# Patient Record
Sex: Female | Born: 1980
Health system: Southern US, Community
[De-identification: ages and names within clinical notes are randomized; demographics above are authoritative.]

## PROBLEM LIST (undated history)

## (undated) ENCOUNTER — Inpatient Hospital Stay (HOSPITAL_COMMUNITY): Payer: Self-pay

## (undated) DIAGNOSIS — E559 Vitamin D deficiency, unspecified: Secondary | ICD-10-CM

## (undated) DIAGNOSIS — M549 Dorsalgia, unspecified: Secondary | ICD-10-CM

## (undated) DIAGNOSIS — R7303 Prediabetes: Secondary | ICD-10-CM

## (undated) DIAGNOSIS — F329 Major depressive disorder, single episode, unspecified: Secondary | ICD-10-CM

## (undated) DIAGNOSIS — F419 Anxiety disorder, unspecified: Secondary | ICD-10-CM

## (undated) DIAGNOSIS — R002 Palpitations: Secondary | ICD-10-CM

## (undated) DIAGNOSIS — K219 Gastro-esophageal reflux disease without esophagitis: Secondary | ICD-10-CM

## (undated) DIAGNOSIS — E039 Hypothyroidism, unspecified: Secondary | ICD-10-CM

## (undated) DIAGNOSIS — E119 Type 2 diabetes mellitus without complications: Secondary | ICD-10-CM

## (undated) DIAGNOSIS — F32A Depression, unspecified: Secondary | ICD-10-CM

## (undated) HISTORY — DX: Palpitations: R00.2

## (undated) HISTORY — PX: MOUTH SURGERY: SHX715

## (undated) HISTORY — DX: Gastro-esophageal reflux disease without esophagitis: K21.9

## (undated) HISTORY — DX: Hypothyroidism, unspecified: E03.9

## (undated) HISTORY — DX: Type 2 diabetes mellitus without complications: E11.9

## (undated) HISTORY — DX: Dorsalgia, unspecified: M54.9

## (undated) HISTORY — DX: Prediabetes: R73.03

## (undated) HISTORY — DX: Depression, unspecified: F32.A

## (undated) HISTORY — DX: Vitamin D deficiency, unspecified: E55.9

## (undated) HISTORY — DX: Anxiety disorder, unspecified: F41.9

---
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
## 2001-02-10 ENCOUNTER — Other Ambulatory Visit: Admission: RE | Admit: 2001-02-10 | Discharge: 2001-02-10 | Payer: Self-pay | Admitting: Specialist

## 2002-10-04 ENCOUNTER — Other Ambulatory Visit: Admission: RE | Admit: 2002-10-04 | Discharge: 2002-10-04 | Payer: Self-pay | Admitting: Dermatology

## 2006-08-30 ENCOUNTER — Ambulatory Visit (HOSPITAL_COMMUNITY): Admission: RE | Admit: 2006-08-30 | Discharge: 2006-08-30 | Payer: Self-pay | Admitting: Obstetrics and Gynecology

## 2006-08-31 ENCOUNTER — Ambulatory Visit: Payer: Self-pay | Admitting: Cardiovascular Disease

## 2007-05-03 ENCOUNTER — Inpatient Hospital Stay (HOSPITAL_COMMUNITY): Admission: AD | Admit: 2007-05-03 | Discharge: 2007-05-06 | Payer: Self-pay | Admitting: Obstetrics and Gynecology

## 2007-05-03 ENCOUNTER — Ambulatory Visit: Payer: Self-pay | Admitting: Obstetrics & Gynecology

## 2007-12-18 ENCOUNTER — Other Ambulatory Visit: Admission: RE | Admit: 2007-12-18 | Discharge: 2007-12-18 | Payer: Self-pay | Admitting: Obstetrics and Gynecology

## 2009-01-14 ENCOUNTER — Other Ambulatory Visit: Admission: RE | Admit: 2009-01-14 | Discharge: 2009-01-14 | Payer: Self-pay | Admitting: Obstetrics and Gynecology

## 2010-04-23 ENCOUNTER — Other Ambulatory Visit: Admission: RE | Admit: 2010-04-23 | Discharge: 2010-04-23 | Payer: Self-pay | Admitting: Obstetrics and Gynecology

## 2010-05-28 ENCOUNTER — Ambulatory Visit (HOSPITAL_COMMUNITY): Admission: RE | Admit: 2010-05-28 | Discharge: 2010-05-28 | Payer: Self-pay | Admitting: Obstetrics & Gynecology

## 2010-06-18 ENCOUNTER — Ambulatory Visit (HOSPITAL_COMMUNITY): Admission: RE | Admit: 2010-06-18 | Discharge: 2010-06-18 | Payer: Self-pay | Admitting: Obstetrics & Gynecology

## 2010-07-10 ENCOUNTER — Ambulatory Visit (HOSPITAL_COMMUNITY): Admission: RE | Admit: 2010-07-10 | Discharge: 2010-07-10 | Payer: Self-pay | Admitting: Obstetrics & Gynecology

## 2010-12-09 ENCOUNTER — Inpatient Hospital Stay (HOSPITAL_COMMUNITY): Admission: AD | Admit: 2010-12-09 | Payer: Self-pay | Source: Ambulatory Visit | Admitting: Obstetrics and Gynecology

## 2010-12-16 ENCOUNTER — Inpatient Hospital Stay (HOSPITAL_COMMUNITY)
Admission: RE | Admit: 2010-12-16 | Discharge: 2010-12-18 | DRG: 373 | Disposition: A | Discharge status: Home / Self Care | Payer: BC Managed Care – PPO | Source: Ambulatory Visit | Attending: Obstetrics & Gynecology | Admitting: Obstetrics & Gynecology

## 2010-12-16 DIAGNOSIS — O99284 Endocrine, nutritional and metabolic diseases complicating childbirth: Secondary | ICD-10-CM | POA: Diagnosis present

## 2010-12-16 DIAGNOSIS — E039 Hypothyroidism, unspecified: Secondary | ICD-10-CM

## 2010-12-16 DIAGNOSIS — O480 Post-term pregnancy: Principal | ICD-10-CM | POA: Diagnosis present

## 2010-12-16 DIAGNOSIS — E079 Disorder of thyroid, unspecified: Secondary | ICD-10-CM

## 2010-12-16 LAB — CBC
MCV: 91 fL (ref 78.0–100.0)
Platelets: 211 10*3/uL (ref 150–400)
RDW: 14.6 % (ref 11.5–15.5)
WBC: 13.6 10*3/uL — ABNORMAL HIGH (ref 4.0–10.5)

## 2010-12-16 LAB — RPR: RPR Ser Ql: NONREACTIVE

## 2011-01-26 NOTE — H&P (Signed)
[name], [name] NO.:  [id number]   MEDICAL RECORD NO.:  [id number]          PATIENT TYPE:  INP   LOCATION:  9173                          FACILITY:  [facility]   PHYSICIAN:  Tilda Burrow, M.D. DATE OF BIRTH:  [birth year]   DATE OF ADMISSION:  05/03/2007  DATE OF DISCHARGE:                              HISTORY & PHYSICAL   ADMITTING DIAGNOSES:  1. Pregnancy [redacted] weeks gestation.  2. Advanced placental calcification.  3. Cervical favorability.  4. Elective induction of labor.   HISTORY OF PRESENT ILLNESS:  This 30 year old female gravida 1, para 0  at [redacted] weeks gestation was admitted on the evening of May 03, 2007,  for Foley bulb cervical ripening and Pitocin induction of labor.  Prenatal course had been followed at [facility] and was notable  for a 4000-g infant at 38 weeks, despite normal glucose tolerance test.  She has had a calcific placenta grade 3 with several calcific rings  noted on placenta.  Amniotic fluid was good.  Fetal movement was good.  She was nonetheless admitted for induction of labor due to both concerns  over continued fetal growth size with size being considered generous,  and concern over the advanced calcification of placenta.  She was  admitted on the evening and Foley bulb placed with cervix 2 cm.   PAST MEDICAL HISTORY:  Benign.   MEDICAL HISTORY:  Hypothyroidism on 88 mcg per day.   SURGICAL HISTORY:  Oral surgery as a child.   ALLERGIES:  None.   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   PHYSICAL EXAMINATION:  Reveals a healthy Caucasian female.  Fundal  height 40-41 cm, estimated fetal weight by ultrasound on April 17, 2007,  was 3839 g.   PRENATAL LABORATORIES:  Blood type A positive.  Urine drug screen  negative.  Hemoglobin 13, hematocrit 40.  Hepatitis, HIV, RPR, GC and  chlamydia all negative.  Group B strep negative.  Glucose tolerance test  178 mg% with 3-hour test  normal.  She had no glucosuria during third  trimester.   She plans on breast feeding, plans circumcision for the female.  Dr. Milford Cage  is her pediatrician.   PLAN:  Foley bulb cervical ripening, Pitocin.  Good prognosis for  vaginal delivery.      Tilda Burrow, M.D.  Electronically Signed     JVF/MEDQ  D:  05/04/2007  T:  05/04/2007  Job:  [id number]

## 2011-01-26 NOTE — H&P (Signed)
[name], [name] NO.:  [id number]   MEDICAL RECORD NO.:  [id number]          PATIENT TYPE:  INP   LOCATION:  9173                          [facility]ACILITY:  [facility]   PHYSICIAN:  Tilda Burrow, M.D. DATE OF BIRTH:  01-10-1981   DATE OF ADMISSION:  DATE OF DISCHARGE:  [facility]                              HISTORY & PHYSICAL   ADMITTING DIAGNOSIS:  Pregnancy at [redacted] weeks gestation.  Elective  induction of labor due to concern over placental maturity.   HISTORY OF PRESENT ILLNESS:  This 30 year old female, gravida 1, para 0,  due May 04, 2007 is scheduled for admission on May 03, 2007 at  9:15 p.m. at 39 weeks 6 days.  She has been followed through our office  with appropriate fundal height growth and a 43-pound weight gain.  Pregnancy has been notable by size slightly greater than dates.  She had  a 3839 gram estimated fetal weight on April 17, 2007 at 37 weeks 4 days  with a normal AFI.  She did have a grade 2 borderline on grade III  placenta.  She has been getting nonstress tests which were reactive.  She is admitted, as we do not wish to add the additional risk factor of  going past her due date in someone with a distinctly calcified placenta.  The patient's cervix is 1 cm, soft and vertex, 20% effaced, and is  considered reasonable for Foley bulb cervical ripening.   PAST MEDICAL HISTORY:  Positive hypothyroidism with normal TSHs during  the pregnancy on 88 mcg per day.   SURGICAL HISTORY:  Oral surgery only.   ALLERGIES:  None.   MEDICATIONS:  Synthroid has been increased from 44 to 88 mcg as the  pregnancy progressed.   SOCIAL HISTORY:  Cigarettes, alcohol and recreational drugs are denied.   SOCIAL HISTORY:  Married.  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   PHYSICAL EXAMINATION:  GENERAL:  A healthy, large-framed Caucasian  female, alert, oriented x3.  HEENT:  Pupils equal, round and reactive.  NECK:  Supple.  CARDIOVASCULAR:   Unremarkable.  Fundal height 39 cm.  Cervix 1 cm, soft,  20%, vertex, -2 station.  Vertex well applied.   PRENATAL LABORATORIES:  Blood type A+.  Urine drug screen negative.  Varicella immunity present.  Rubella immunity present.  Hemoglobin 13,  hematocrit 40.  Hepatitis, HIV, HPV, RPR, GC and Chlamydia are all  negative.  Group B strep negative.  Glucose tolerance test 178 mg% on 1-  hour test with normal 3-hour test of 89, 148, 130 and 144.  She plans to  breast feed.  Desires circumcision.  Will take the baby to Dr. Milford Cage.  Will be supported by Alvester Chou, her husband, and he is having his  first child, as well.   PLAN:  Foley bulb cervical ripening on the evening of May 03, 2007  and Pitocin induction.  Reasonable prognosis for vaginal delivery.      Tilda Burrow, M.D.  Electronically Signed     JVF/MEDQ  D:  05/01/2007  T:  05/02/2007  Job:  [id number]   cc:   Francoise Schaumann. Milford Cage DO, FAAP  Fax: [phone]

## 2011-01-29 NOTE — Procedures (Signed)
[name], [name]                ACCOUNT NO.:  [id number]   MEDICAL RECORD NO.:  [id number]          PATIENT TYPE:  OUT   LOCATION:  RAD                           FACILITY:  [facility]   PHYSICIAN:  Peter C. Eden Emms, MD, FACCDATE OF BIRTH:  10-31-1980   DATE OF PROCEDURE:  08/31/2006  DATE OF DISCHARGE:                                ECHOCARDIOGRAM   PROCEDURES:  A 24-hour Holter monitor.   INDICATIONS:  A 30 year old patient with palpitations.   The patient wore a 24-hour Holter monitor between December 18 and  August 31, 2006.   She had an extensive list of the symptoms regarding palpitations.   Average heart rate during the 24 hours was 84 beats per minute; maximal  heart rate was 133; minimal heart rate was 56.   The patient's primary rhythm was sinus.  She had occasional PACs and  PVCs.  The majority of her symptoms showed her to be in sinus rhythm  with no arrhythmias.   IMPRESSION:  No significant arrhythmias on Holter monitoring. Symptoms  do not correlate with PACs her PVCs.      Noralyn Pick. Eden Emms, MD, [facility]  Electronically Signed     PCN/MEDQ  D:  08/31/2006  T:  08/31/2006  Job:  [id number]   cc:   Tilda Burrow, M.D.  Fax: [phone]

## 2011-06-25 LAB — CBC
HCT: 25.2 — ABNORMAL LOW
MCHC: 35
Platelets: 199
RBC: 2.79 — ABNORMAL LOW
RBC: 3.81 — ABNORMAL LOW
WBC: 14.1 — ABNORMAL HIGH
WBC: 16.2 — ABNORMAL HIGH

## 2011-06-25 LAB — RPR: RPR Ser Ql: NONREACTIVE

## 2011-08-10 ENCOUNTER — Other Ambulatory Visit (HOSPITAL_COMMUNITY)
Admission: RE | Admit: 2011-08-10 | Discharge: 2011-08-10 | Disposition: A | Discharge status: Home / Self Care | Payer: Self-pay | Source: Ambulatory Visit | Attending: Obstetrics & Gynecology | Admitting: Obstetrics & Gynecology

## 2011-08-10 DIAGNOSIS — Z01419 Encounter for gynecological examination (general) (routine) without abnormal findings: Secondary | ICD-10-CM | POA: Insufficient documentation

## 2012-10-25 LAB — OB RESULTS CONSOLE HEPATITIS B SURFACE ANTIGEN: Hepatitis B Surface Ag: NEGATIVE

## 2012-10-25 LAB — OB RESULTS CONSOLE VARICELLA ZOSTER ANTIBODY, IGG: Varicella: IMMUNE

## 2012-10-25 LAB — OB RESULTS CONSOLE ANTIBODY SCREEN: Antibody Screen: NEGATIVE

## 2012-11-15 ENCOUNTER — Encounter: Payer: Self-pay | Admitting: *Deleted

## 2012-11-15 DIAGNOSIS — E039 Hypothyroidism, unspecified: Secondary | ICD-10-CM

## 2012-11-22 ENCOUNTER — Other Ambulatory Visit: Payer: Self-pay | Admitting: Obstetrics & Gynecology

## 2012-11-22 DIAGNOSIS — Z36 Encounter for antenatal screening of mother: Secondary | ICD-10-CM

## 2012-12-05 ENCOUNTER — Ambulatory Visit (INDEPENDENT_AMBULATORY_CARE_PROVIDER_SITE_OTHER): Payer: BC Managed Care – PPO

## 2012-12-05 ENCOUNTER — Other Ambulatory Visit: Payer: Self-pay | Admitting: Obstetrics & Gynecology

## 2012-12-05 ENCOUNTER — Ambulatory Visit (INDEPENDENT_AMBULATORY_CARE_PROVIDER_SITE_OTHER): Payer: BC Managed Care – PPO | Admitting: Obstetrics & Gynecology

## 2012-12-05 VITALS — BP 120/80 | Wt 198.0 lb

## 2012-12-05 DIAGNOSIS — Z36 Encounter for antenatal screening of mother: Secondary | ICD-10-CM

## 2012-12-05 DIAGNOSIS — Z3480 Encounter for supervision of other normal pregnancy, unspecified trimester: Secondary | ICD-10-CM | POA: Insufficient documentation

## 2012-12-05 DIAGNOSIS — Z3490 Encounter for supervision of normal pregnancy, unspecified, unspecified trimester: Secondary | ICD-10-CM

## 2012-12-05 DIAGNOSIS — E079 Disorder of thyroid, unspecified: Secondary | ICD-10-CM

## 2012-12-05 DIAGNOSIS — Z3482 Encounter for supervision of other normal pregnancy, second trimester: Secondary | ICD-10-CM

## 2012-12-05 LAB — POCT URINALYSIS DIPSTICK
Ketones, UA: NEGATIVE
Nitrite, UA: NEGATIVE

## 2012-12-05 NOTE — Progress Notes (Signed)
U/S-(12+6wks)-single active fetus, CRL c/w dates, +FCA noted FHR=156BPM, cx long and closed, NB present, NT-1.64mm, bilateral adnexa wnl

## 2012-12-05 NOTE — Progress Notes (Signed)
1st IT  

## 2012-12-05 NOTE — Progress Notes (Signed)
No complaints no bleeding no nausea, had IT/NT done today.

## 2012-12-05 NOTE — Patient Instructions (Signed)
Pregnancy - Second Trimester The second trimester of pregnancy (3 to 6 months) is a period of rapid growth for you and your baby. At the end of the sixth month, your baby is about 9 inches long and weighs 1 1/2 pounds. You will begin to feel the baby move between 18 and 20 weeks of the pregnancy. This is called quickening. Weight gain is faster. A clear fluid (colostrum) may leak out of your breasts. You may feel small contractions of the womb (uterus). This is known as false labor or Braxton-Hicks contractions. This is like a practice for labor when the baby is ready to be born. Usually, the problems with morning sickness have usually passed by the end of your first trimester. Some women develop small dark blotches (called cholasma, mask of pregnancy) on their face that usually goes away after the baby is born. Exposure to the sun makes the blotches worse. Acne may also develop in some pregnant women and pregnant women who have acne, may find that it goes away. PRENATAL EXAMS  Blood work may continue to be done during prenatal exams. These tests are done to check on your health and the probable health of your baby. Blood work is used to follow your blood levels (hemoglobin). Anemia (low hemoglobin) is common during pregnancy. Iron and vitamins are given to help prevent this. You will also be checked for diabetes between 24 and 28 weeks of the pregnancy. Some of the previous blood tests may be repeated.  The size of the uterus is measured during each visit. This is to make sure that the baby is continuing to grow properly according to the dates of the pregnancy.  Your blood pressure is checked every prenatal visit. This is to make sure you are not getting toxemia.  Your urine is checked to make sure you do not have an infection, diabetes or protein in the urine.  Your weight is checked often to make sure gains are happening at the suggested rate. This is to ensure that both you and your baby are growing  normally.  Sometimes, an ultrasound is performed to confirm the proper growth and development of the baby. This is a test which bounces harmless sound waves off the baby so your caregiver can more accurately determine due dates. Sometimes, a specialized test is done on the amniotic fluid surrounding the baby. This test is called an amniocentesis. The amniotic fluid is obtained by sticking a needle into the belly (abdomen). This is done to check the chromosomes in instances where there is a concern about possible genetic problems with the baby. It is also sometimes done near the end of pregnancy if an early delivery is required. In this case, it is done to help make sure the baby's lungs are mature enough for the baby to live outside of the womb. CHANGES OCCURING IN THE SECOND TRIMESTER OF PREGNANCY Your body goes through many changes during pregnancy. They vary from person to person. Talk to your caregiver about changes you notice that you are concerned about.  During the second trimester, you will likely have an increase in your appetite. It is normal to have cravings for certain foods. This varies from person to person and pregnancy to pregnancy.  Your lower abdomen will begin to bulge.  You may have to urinate more often because the uterus and baby are pressing on your bladder. It is also common to get more bladder infections during pregnancy (pain with urination). You can help this by   drinking lots of fluids and emptying your bladder before and after intercourse.  You may begin to get stretch marks on your hips, abdomen, and breasts. These are normal changes in the body during pregnancy. There are no exercises or medications to take that prevent this change.  You may begin to develop swollen and bulging veins (varicose veins) in your legs. Wearing support hose, elevating your feet for 15 minutes, 3 to 4 times a day and limiting salt in your diet helps lessen the problem.  Heartburn may develop  as the uterus grows and pushes up against the stomach. Antacids recommended by your caregiver helps with this problem. Also, eating smaller meals 4 to 5 times a day helps.  Constipation can be treated with a stool softener or adding bulk to your diet. Drinking lots of fluids, vegetables, fruits, and whole grains are helpful.  Exercising is also helpful. If you have been very active up until your pregnancy, most of these activities can be continued during your pregnancy. If you have been less active, it is helpful to start an exercise program such as walking.  Hemorrhoids (varicose veins in the rectum) may develop at the end of the second trimester. Warm sitz baths and hemorrhoid cream recommended by your caregiver helps hemorrhoid problems.  Backaches may develop during this time of your pregnancy. Avoid heavy lifting, wear low heal shoes and practice good posture to help with backache problems.  Some pregnant women develop tingling and numbness of their hand and fingers because of swelling and tightening of ligaments in the wrist (carpel tunnel syndrome). This goes away after the baby is born.  As your breasts enlarge, you may have to get a bigger bra. Get a comfortable, cotton, support bra. Do not get a nursing bra until the last month of the pregnancy if you will be nursing the baby.  You may get a dark line from your belly button to the pubic area called the linea nigra.  You may develop rosy cheeks because of increase blood flow to the face.  You may develop spider looking lines of the face, neck, arms and chest. These go away after the baby is born. HOME CARE INSTRUCTIONS   It is extremely important to avoid all smoking, herbs, alcohol, and unprescribed drugs during your pregnancy. These chemicals affect the formation and growth of the baby. Avoid these chemicals throughout the pregnancy to ensure the delivery of a healthy infant.  Most of your home care instructions are the same as  suggested for the first trimester of your pregnancy. Keep your caregiver's appointments. Follow your caregiver's instructions regarding medication use, exercise and diet.  During pregnancy, you are providing food for you and your baby. Continue to eat regular, well-balanced meals. Choose foods such as meat, fish, milk and other low fat dairy products, vegetables, fruits, and whole-grain breads and cereals. Your caregiver will tell you of the ideal weight gain.  A physical sexual relationship may be continued up until near the end of pregnancy if there are no other problems. Problems could include early (premature) leaking of amniotic fluid from the membranes, vaginal bleeding, abdominal pain, or other medical or pregnancy problems.  Exercise regularly if there are no restrictions. Check with your caregiver if you are unsure of the safety of some of your exercises. The greatest weight gain will occur in the last 2 trimesters of pregnancy. Exercise will help you:  Control your weight.  Get you in shape for labor and delivery.  Lose weight   after you have the baby.  Wear a good support or jogging bra for breast tenderness during pregnancy. This may help if worn during sleep. Pads or tissues may be used in the bra if you are leaking colostrum.  Do not use hot tubs, steam rooms or saunas throughout the pregnancy.  Wear your seat belt at all times when driving. This protects you and your baby if you are in an accident.  Avoid raw meat, uncooked cheese, cat litter boxes and soil used by cats. These carry germs that can cause birth defects in the baby.  The second trimester is also a good time to visit your dentist for your dental health if this has not been done yet. Getting your teeth cleaned is OK. Use a soft toothbrush. Brush gently during pregnancy.  It is easier to loose urine during pregnancy. Tightening up and strengthening the pelvic muscles will help with this problem. Practice stopping your  urination while you are going to the bathroom. These are the same muscles you need to strengthen. It is also the muscles you would use as if you were trying to stop from passing gas. You can practice tightening these muscles up 10 times a set and repeating this about 3 times per day. Once you know what muscles to tighten up, do not perform these exercises during urination. It is more likely to contribute to an infection by backing up the urine.  Ask for help if you have financial, counseling or nutritional needs during pregnancy. Your caregiver will be able to offer counseling for these needs as well as refer you for other special needs.  Your skin may become oily. If so, wash your face with mild soap, use non-greasy moisturizer and oil or cream based makeup. MEDICATIONS AND DRUG USE IN PREGNANCY  Take prenatal vitamins as directed. The vitamin should contain 1 milligram of folic acid. Keep all vitamins out of reach of children. Only a couple vitamins or tablets containing iron may be fatal to a baby or young child when ingested.  Avoid use of all medications, including herbs, over-the-counter medications, not prescribed or suggested by your caregiver. Only take over-the-counter or prescription medicines for pain, discomfort, or fever as directed by your caregiver. Do not use aspirin.  Let your caregiver also know about herbs you may be using.  Alcohol is related to a number of birth defects. This includes fetal alcohol syndrome. All alcohol, in any form, should be avoided completely. Smoking will cause low birth rate and premature babies.  Street or illegal drugs are very harmful to the baby. They are absolutely forbidden. A baby born to an addicted mother will be addicted at birth. The baby will go through the same withdrawal an adult does. SEEK MEDICAL CARE IF:  You have any concerns or worries during your pregnancy. It is better to call with your questions if you feel they cannot wait, rather  than worry about them. SEEK IMMEDIATE MEDICAL CARE IF:   An unexplained oral temperature above 102 F (38.9 C) develops, or as your caregiver suggests.  You have leaking of fluid from the vagina (birth canal). If leaking membranes are suspected, take your temperature and tell your caregiver of this when you call.  There is vaginal spotting, bleeding, or passing clots. Tell your caregiver of the amount and how many pads are used. Light spotting in pregnancy is common, especially following intercourse.  You develop a bad smelling vaginal discharge with a change in the color from clear   to white.  You continue to feel sick to your stomach (nauseated) and have no relief from remedies suggested. You vomit blood or coffee ground-like materials.  You lose more than 2 pounds of weight or gain more than 2 pounds of weight over 1 week, or as suggested by your caregiver.  You notice swelling of your face, hands, feet, or legs.  You get exposed to German measles and have never had them.  You are exposed to fifth disease or chickenpox.  You develop belly (abdominal) pain. Round ligament discomfort is a common non-cancerous (benign) cause of abdominal pain in pregnancy. Your caregiver still must evaluate you.  You develop a bad headache that does not go away.  You develop fever, diarrhea, pain with urination, or shortness of breath.  You develop visual problems, blurry, or double vision.  You fall or are in a car accident or any kind of trauma.  There is mental or physical violence at home. Document Released: 08/24/2001 Document Revised: 11/22/2011 Document Reviewed: 02/26/2009 [employer] Patient Information 2013 [employer].  

## 2012-12-11 LAB — MATERNAL SCREEN, INTEGRATED #1
Crown Rump Length: 70.9 mm
Maternal weight: 198 [lb_av]
Nuchal translucency: 1.48 mm
Number of fetuses: 1
Referring Physician NPI: 1881783975
Referring physician phone: 3363426063

## 2013-01-02 ENCOUNTER — Encounter: Payer: Self-pay | Admitting: Obstetrics & Gynecology

## 2013-01-02 ENCOUNTER — Other Ambulatory Visit: Payer: Self-pay | Admitting: Obstetrics & Gynecology

## 2013-01-02 ENCOUNTER — Ambulatory Visit (INDEPENDENT_AMBULATORY_CARE_PROVIDER_SITE_OTHER): Payer: BC Managed Care – PPO | Admitting: Obstetrics & Gynecology

## 2013-01-02 VITALS — BP 112/60 | Wt 201.0 lb

## 2013-01-02 DIAGNOSIS — E039 Hypothyroidism, unspecified: Secondary | ICD-10-CM

## 2013-01-02 DIAGNOSIS — E079 Disorder of thyroid, unspecified: Secondary | ICD-10-CM

## 2013-01-02 DIAGNOSIS — Z3480 Encounter for supervision of other normal pregnancy, unspecified trimester: Secondary | ICD-10-CM

## 2013-01-02 LAB — POCT URINALYSIS DIPSTICK
Ketones, UA: NEGATIVE
Protein, UA: NEGATIVE

## 2013-01-02 NOTE — Progress Notes (Signed)
PT here today for 2 IT.

## 2013-01-02 NOTE — Progress Notes (Signed)
BP weight and urine results all reviewed and noted. Patient reports +/- fetal movement, denies any bleeding and no rupture of membranes symptoms or regular contractions. Patient is without complaints. All questions were answered. 2nd IT today.

## 2013-01-06 LAB — MATERNAL SCREEN, INTEGRATED #2
AFP MoM: 0.91
Age risk Down Syndrome: 1:440 {titer}
Estriol Mom: 0.82
Inhibin A MoM: 0.95
MSS Down Syndrome: <1:5000 {titer}
MSS Trisomy 18 Risk: <1:5000 {titer}
Nuchal Translucency: 1.48 mm
Number of fetuses: 1
PAPP-A MoM: 1.12
PAPP-A: 928 ng/mL

## 2013-01-11 ENCOUNTER — Telehealth: Payer: Self-pay | Admitting: Obstetrics & Gynecology

## 2013-01-11 NOTE — Telephone Encounter (Signed)
Pt informed of negative IT.

## 2013-01-30 ENCOUNTER — Encounter: Payer: Self-pay | Admitting: Women's Health

## 2013-01-30 ENCOUNTER — Ambulatory Visit (INDEPENDENT_AMBULATORY_CARE_PROVIDER_SITE_OTHER): Payer: BC Managed Care – PPO

## 2013-01-30 ENCOUNTER — Other Ambulatory Visit: Payer: Self-pay | Admitting: Obstetrics & Gynecology

## 2013-01-30 ENCOUNTER — Ambulatory Visit (INDEPENDENT_AMBULATORY_CARE_PROVIDER_SITE_OTHER): Payer: BC Managed Care – PPO | Admitting: Women's Health

## 2013-01-30 VITALS — BP 120/60 | Wt 199.8 lb

## 2013-01-30 DIAGNOSIS — E039 Hypothyroidism, unspecified: Secondary | ICD-10-CM

## 2013-01-30 DIAGNOSIS — E079 Disorder of thyroid, unspecified: Secondary | ICD-10-CM

## 2013-01-30 DIAGNOSIS — Z331 Pregnant state, incidental: Secondary | ICD-10-CM

## 2013-01-30 DIAGNOSIS — Z1389 Encounter for screening for other disorder: Secondary | ICD-10-CM

## 2013-01-30 DIAGNOSIS — Z3480 Encounter for supervision of other normal pregnancy, unspecified trimester: Secondary | ICD-10-CM

## 2013-01-30 DIAGNOSIS — O99282 Endocrine, nutritional and metabolic diseases complicating pregnancy, second trimester: Secondary | ICD-10-CM

## 2013-01-30 DIAGNOSIS — Z3482 Encounter for supervision of other normal pregnancy, second trimester: Secondary | ICD-10-CM

## 2013-01-30 DIAGNOSIS — O99891 Other specified diseases and conditions complicating pregnancy: Secondary | ICD-10-CM

## 2013-01-30 LAB — POCT URINALYSIS DIPSTICK
Glucose, UA: NEGATIVE
Nitrite, UA: NEGATIVE

## 2013-01-30 NOTE — Progress Notes (Signed)
Reports good fm. Denies uc's, lof, vb, urinary frequency, urgency, hesitancy, or dysuria.  Had low grade fever w/ diarrhea over weekend x 1.5days- has resolved.  Reviewed warning s/s to report.  All questions answered. TSH today.  F/U in 4wks for visit.

## 2013-01-30 NOTE — Patient Instructions (Signed)
Pregnancy - Second Trimester The second trimester of pregnancy (3 to 6 months) is a period of rapid growth for you and your baby. At the end of the sixth month, your baby is about 9 inches long and weighs 1 1/2 pounds. You will begin to feel the baby move between 18 and 20 weeks of the pregnancy. This is called quickening. Weight gain is faster. A clear fluid (colostrum) may leak out of your breasts. You may feel small contractions of the womb (uterus). This is known as false labor or Braxton-Hicks contractions. This is like a practice for labor when the baby is ready to be born. Usually, the problems with morning sickness have usually passed by the end of your first trimester. Some women develop small dark blotches (called cholasma, mask of pregnancy) on their face that usually goes away after the baby is born. Exposure to the sun makes the blotches worse. Acne may also develop in some pregnant women and pregnant women who have acne, may find that it goes away. PRENATAL EXAMS  Blood work may continue to be done during prenatal exams. These tests are done to check on your health and the probable health of your baby. Blood work is used to follow your blood levels (hemoglobin). Anemia (low hemoglobin) is common during pregnancy. Iron and vitamins are given to help prevent this. You will also be checked for diabetes between 24 and 28 weeks of the pregnancy. Some of the previous blood tests may be repeated.  The size of the uterus is measured during each visit. This is to make sure that the baby is continuing to grow properly according to the dates of the pregnancy.  Your blood pressure is checked every prenatal visit. This is to make sure you are not getting toxemia.  Your urine is checked to make sure you do not have an infection, diabetes or protein in the urine.  Your weight is checked often to make sure gains are happening at the suggested rate. This is to ensure that both you and your baby are growing  normally.  Sometimes, an ultrasound is performed to confirm the proper growth and development of the baby. This is a test which bounces harmless sound waves off the baby so your caregiver can more accurately determine due dates. Sometimes, a specialized test is done on the amniotic fluid surrounding the baby. This test is called an amniocentesis. The amniotic fluid is obtained by sticking a needle into the belly (abdomen). This is done to check the chromosomes in instances where there is a concern about possible genetic problems with the baby. It is also sometimes done near the end of pregnancy if an early delivery is required. In this case, it is done to help make sure the baby's lungs are mature enough for the baby to live outside of the womb. CHANGES OCCURING IN THE SECOND TRIMESTER OF PREGNANCY Your body goes through many changes during pregnancy. They vary from person to person. Talk to your caregiver about changes you notice that you are concerned about.  During the second trimester, you will likely have an increase in your appetite. It is normal to have cravings for certain foods. This varies from person to person and pregnancy to pregnancy.  Your lower abdomen will begin to bulge.  You may have to urinate more often because the uterus and baby are pressing on your bladder. It is also common to get more bladder infections during pregnancy (pain with urination). You can help this by   drinking lots of fluids and emptying your bladder before and after intercourse.  You may begin to get stretch marks on your hips, abdomen, and breasts. These are normal changes in the body during pregnancy. There are no exercises or medications to take that prevent this change.  You may begin to develop swollen and bulging veins (varicose veins) in your legs. Wearing support hose, elevating your feet for 15 minutes, 3 to 4 times a day and limiting salt in your diet helps lessen the problem.  Heartburn may develop  as the uterus grows and pushes up against the stomach. Antacids recommended by your caregiver helps with this problem. Also, eating smaller meals 4 to 5 times a day helps.  Constipation can be treated with a stool softener or adding bulk to your diet. Drinking lots of fluids, vegetables, fruits, and whole grains are helpful.  Exercising is also helpful. If you have been very active up until your pregnancy, most of these activities can be continued during your pregnancy. If you have been less active, it is helpful to start an exercise program such as walking.  Hemorrhoids (varicose veins in the rectum) may develop at the end of the second trimester. Warm sitz baths and hemorrhoid cream recommended by your caregiver helps hemorrhoid problems.  Backaches may develop during this time of your pregnancy. Avoid heavy lifting, wear low heal shoes and practice good posture to help with backache problems.  Some pregnant women develop tingling and numbness of their hand and fingers because of swelling and tightening of ligaments in the wrist (carpel tunnel syndrome). This goes away after the baby is born.  As your breasts enlarge, you may have to get a bigger bra. Get a comfortable, cotton, support bra. Do not get a nursing bra until the last month of the pregnancy if you will be nursing the baby.  You may get a dark line from your belly button to the pubic area called the linea nigra.  You may develop rosy cheeks because of increase blood flow to the face.  You may develop spider looking lines of the face, neck, arms and chest. These go away after the baby is born. HOME CARE INSTRUCTIONS   It is extremely important to avoid all smoking, herbs, alcohol, and unprescribed drugs during your pregnancy. These chemicals affect the formation and growth of the baby. Avoid these chemicals throughout the pregnancy to ensure the delivery of a healthy infant.  Most of your home care instructions are the same as  suggested for the first trimester of your pregnancy. Keep your caregiver's appointments. Follow your caregiver's instructions regarding medication use, exercise and diet.  During pregnancy, you are providing food for you and your baby. Continue to eat regular, well-balanced meals. Choose foods such as meat, fish, milk and other low fat dairy products, vegetables, fruits, and whole-grain breads and cereals. Your caregiver will tell you of the ideal weight gain.  A physical sexual relationship may be continued up until near the end of pregnancy if there are no other problems. Problems could include early (premature) leaking of amniotic fluid from the membranes, vaginal bleeding, abdominal pain, or other medical or pregnancy problems.  Exercise regularly if there are no restrictions. Check with your caregiver if you are unsure of the safety of some of your exercises. The greatest weight gain will occur in the last 2 trimesters of pregnancy. Exercise will help you:  Control your weight.  Get you in shape for labor and delivery.  Lose weight   after you have the baby.  Wear a good support or jogging bra for breast tenderness during pregnancy. This may help if worn during sleep. Pads or tissues may be used in the bra if you are leaking colostrum.  Do not use hot tubs, steam rooms or saunas throughout the pregnancy.  Wear your seat belt at all times when driving. This protects you and your baby if you are in an accident.  Avoid raw meat, uncooked cheese, cat litter boxes and soil used by cats. These carry germs that can cause birth defects in the baby.  The second trimester is also a good time to visit your dentist for your dental health if this has not been done yet. Getting your teeth cleaned is OK. Use a soft toothbrush. Brush gently during pregnancy.  It is easier to loose urine during pregnancy. Tightening up and strengthening the pelvic muscles will help with this problem. Practice stopping your  urination while you are going to the bathroom. These are the same muscles you need to strengthen. It is also the muscles you would use as if you were trying to stop from passing gas. You can practice tightening these muscles up 10 times a set and repeating this about 3 times per day. Once you know what muscles to tighten up, do not perform these exercises during urination. It is more likely to contribute to an infection by backing up the urine.  Ask for help if you have financial, counseling or nutritional needs during pregnancy. Your caregiver will be able to offer counseling for these needs as well as refer you for other special needs.  Your skin may become oily. If so, wash your face with mild soap, use non-greasy moisturizer and oil or cream based makeup. MEDICATIONS AND DRUG USE IN PREGNANCY  Take prenatal vitamins as directed. The vitamin should contain 1 milligram of folic acid. Keep all vitamins out of reach of children. Only a couple vitamins or tablets containing iron may be fatal to a baby or young child when ingested.  Avoid use of all medications, including herbs, over-the-counter medications, not prescribed or suggested by your caregiver. Only take over-the-counter or prescription medicines for pain, discomfort, or fever as directed by your caregiver. Do not use aspirin.  Let your caregiver also know about herbs you may be using.  Alcohol is related to a number of birth defects. This includes fetal alcohol syndrome. All alcohol, in any form, should be avoided completely. Smoking will cause low birth rate and premature babies.  Street or illegal drugs are very harmful to the baby. They are absolutely forbidden. A baby born to an addicted mother will be addicted at birth. The baby will go through the same withdrawal an adult does. SEEK MEDICAL CARE IF:  You have any concerns or worries during your pregnancy. It is better to call with your questions if you feel they cannot wait, rather  than worry about them. SEEK IMMEDIATE MEDICAL CARE IF:   An unexplained oral temperature above 102 F (38.9 C) develops, or as your caregiver suggests.  You have leaking of fluid from the vagina (birth canal). If leaking membranes are suspected, take your temperature and tell your caregiver of this when you call.  There is vaginal spotting, bleeding, or passing clots. Tell your caregiver of the amount and how many pads are used. Light spotting in pregnancy is common, especially following intercourse.  You develop a bad smelling vaginal discharge with a change in the color from clear   to white.  You continue to feel sick to your stomach (nauseated) and have no relief from remedies suggested. You vomit blood or coffee ground-like materials.  You lose more than 2 pounds of weight or gain more than 2 pounds of weight over 1 week, or as suggested by your caregiver.  You notice swelling of your face, hands, feet, or legs.  You get exposed to German measles and have never had them.  You are exposed to fifth disease or chickenpox.  You develop belly (abdominal) pain. Round ligament discomfort is a common non-cancerous (benign) cause of abdominal pain in pregnancy. Your caregiver still must evaluate you.  You develop a bad headache that does not go away.  You develop fever, diarrhea, pain with urination, or shortness of breath.  You develop visual problems, blurry, or double vision.  You fall or are in a car accident or any kind of trauma.  There is mental or physical violence at home. Document Released: 08/24/2001 Document Revised: 11/22/2011 Document Reviewed: 02/26/2009 [employer] Patient Information 2013 [employer].  

## 2013-01-30 NOTE — Progress Notes (Signed)
U/S(20+6wks)-active fetus, meas c/w dates, fluid wnl, ant gr 0 plac, no major abnl noted, cx long and closed, bilateral adnexa wnl, female fetus

## 2013-02-06 ENCOUNTER — Encounter: Payer: Self-pay | Admitting: Women's Health

## 2013-02-07 LAB — US OB DETAIL + 14 WK

## 2013-02-27 ENCOUNTER — Encounter: Payer: Self-pay | Admitting: Women's Health

## 2013-02-27 ENCOUNTER — Ambulatory Visit (INDEPENDENT_AMBULATORY_CARE_PROVIDER_SITE_OTHER): Payer: BC Managed Care – PPO | Admitting: Women's Health

## 2013-02-27 VITALS — BP 100/50 | Wt 206.4 lb

## 2013-02-27 DIAGNOSIS — Z1389 Encounter for screening for other disorder: Secondary | ICD-10-CM

## 2013-02-27 DIAGNOSIS — E079 Disorder of thyroid, unspecified: Secondary | ICD-10-CM

## 2013-02-27 DIAGNOSIS — R5383 Other fatigue: Secondary | ICD-10-CM

## 2013-02-27 DIAGNOSIS — Z331 Pregnant state, incidental: Secondary | ICD-10-CM

## 2013-02-27 DIAGNOSIS — O99280 Endocrine, nutritional and metabolic diseases complicating pregnancy, unspecified trimester: Secondary | ICD-10-CM

## 2013-02-27 DIAGNOSIS — O99019 Anemia complicating pregnancy, unspecified trimester: Secondary | ICD-10-CM

## 2013-02-27 LAB — POCT URINALYSIS DIPSTICK: Protein, UA: NEGATIVE

## 2013-02-27 LAB — POCT HEMOGLOBIN: Hemoglobin: 10.8 g/dL — AB (ref 12.2–16.2)

## 2013-02-27 NOTE — Patient Instructions (Addendum)
You will have your sugar test next visit.  Please do not eat or drink anything after midnight the night before you come, not even water.  You will be here for at least two hours.     Fatigue Fatigue is a feeling of tiredness, lack of energy, lack of motivation, or feeling tired all the time. Having enough rest, good nutrition, and reducing stress will normally reduce fatigue. Consult your caregiver if it persists. The nature of your fatigue will help your caregiver to find out its cause. The treatment is based on the cause.  CAUSES  There are many causes for fatigue. Most of the time, fatigue can be traced to one or more of your habits or routines. Most causes fit into one or more of three general areas. They are: Lifestyle problems  Sleep disturbances.  Overwork.  Physical exertion.  Unhealthy habits.  Poor eating habits or eating disorders.  Alcohol and/or drug use .  Lack of proper nutrition (malnutrition). Psychological problems  Stress and/or anxiety problems.  Depression.  Grief.  Boredom. Medical Problems or Conditions  Anemia.  Pregnancy.  Thyroid gland problems.  Recovery from major surgery.  Continuous pain.  Emphysema or asthma that is not well controlled  Allergic conditions.  Diabetes.  Infections (such as mononucleosis).  Obesity.  Sleep disorders, such as sleep apnea.  Heart failure or other heart-related problems.  Cancer.  Kidney disease.  Liver disease.  Effects of certain medicines such as antihistamines, cough and cold remedies, prescription pain medicines, heart and blood pressure medicines, drugs used for treatment of cancer, and some antidepressants. SYMPTOMS  The symptoms of fatigue include:   Lack of energy.  Lack of drive (motivation).  Drowsiness.  Feeling of indifference to the surroundings. DIAGNOSIS  The details of how you feel help guide your caregiver in finding out what is causing the fatigue. You will be asked  about your present and past health condition. It is important to review all medicines that you take, including prescription and non-prescription items. A thorough exam will be done. You will be questioned about your feelings, habits, and normal lifestyle. Your caregiver may suggest blood tests, urine tests, or other tests to look for common medical causes of fatigue.  TREATMENT  Fatigue is treated by correcting the underlying cause. For example, if you have continuous pain or depression, treating these causes will improve how you feel. Similarly, adjusting the dose of certain medicines will help in reducing fatigue.  HOME CARE INSTRUCTIONS   Try to get the required amount of good sleep every night.  Eat a healthy and nutritious diet, and drink enough water throughout the day.  Practice ways of relaxing (including yoga or meditation).  Exercise regularly.  Make plans to change situations that cause stress. Act on those plans so that stresses decrease over time. Keep your work and personal routine reasonable.  Avoid street drugs and minimize use of alcohol.  Start taking a daily multivitamin after consulting your caregiver. SEEK MEDICAL CARE IF:   You have persistent tiredness, which cannot be accounted for.  You have fever.  You have unintentional weight loss.  You have headaches.  You have disturbed sleep throughout the night.  You are feeling sad.  You have constipation.  You have dry skin.  You have gained weight.  You are taking any new or different medicines that you suspect are causing fatigue.  You are unable to sleep at night.  You develop any unusual swelling of your legs or  other parts of your body. SEEK IMMEDIATE MEDICAL CARE IF:   You are feeling confused.  Your vision is blurred.  You feel faint or pass out.  You develop severe headache.  You develop severe abdominal, pelvic, or back pain.  You develop chest pain, shortness of breath, or an irregular  or fast heartbeat.  You are unable to pass a normal amount of urine.  You develop abnormal bleeding such as bleeding from the rectum or you vomit blood.  You have thoughts about harming yourself or committing suicide.  You are worried that you might harm someone else. MAKE SURE YOU:   Understand these instructions.  Will watch your condition.  Will get help right away if you are not doing well or get worse. Document Released: 06/27/2007 Document Revised: 11/22/2011 Document Reviewed: 06/27/2007 [facility] Patient Information 2014 [place], [place].

## 2013-02-27 NOTE — Progress Notes (Signed)
Reports good fm. Denies uc's, lof, vb, urinary frequency, urgency, hesitancy, or dysuria.  Increased fatigue, wanted Hgb checked: 10.8. Reviewed fatigue relief measures.  Reviewed ptl s/s, fetal movement.  All questions answered. F/U in 3wks for PN2 and visit.

## 2013-03-21 ENCOUNTER — Other Ambulatory Visit: Payer: BC Managed Care – PPO

## 2013-03-21 ENCOUNTER — Encounter: Payer: Self-pay | Admitting: Obstetrics and Gynecology

## 2013-03-21 ENCOUNTER — Ambulatory Visit (INDEPENDENT_AMBULATORY_CARE_PROVIDER_SITE_OTHER): Payer: BC Managed Care – PPO | Admitting: Obstetrics and Gynecology

## 2013-03-21 VITALS — BP 110/76 | Wt 211.0 lb

## 2013-03-21 DIAGNOSIS — O99019 Anemia complicating pregnancy, unspecified trimester: Secondary | ICD-10-CM

## 2013-03-21 DIAGNOSIS — E039 Hypothyroidism, unspecified: Secondary | ICD-10-CM

## 2013-03-21 DIAGNOSIS — O99280 Endocrine, nutritional and metabolic diseases complicating pregnancy, unspecified trimester: Secondary | ICD-10-CM

## 2013-03-21 DIAGNOSIS — Z1389 Encounter for screening for other disorder: Secondary | ICD-10-CM

## 2013-03-21 DIAGNOSIS — E079 Disorder of thyroid, unspecified: Secondary | ICD-10-CM

## 2013-03-21 DIAGNOSIS — Z3480 Encounter for supervision of other normal pregnancy, unspecified trimester: Secondary | ICD-10-CM

## 2013-03-21 DIAGNOSIS — Z331 Pregnant state, incidental: Secondary | ICD-10-CM

## 2013-03-21 LAB — CBC
HCT: 30 % — ABNORMAL LOW (ref 36.0–46.0)
MCH: 29 pg (ref 26.0–34.0)
MCHC: 33 g/dL (ref 30.0–36.0)
RDW: 14 % (ref 11.5–15.5)

## 2013-03-21 LAB — POCT URINALYSIS DIPSTICK
Blood, UA: NEGATIVE
Glucose, UA: NEGATIVE
Nitrite, UA: NEGATIVE

## 2013-03-21 NOTE — Patient Instructions (Signed)

## 2013-03-21 NOTE — Progress Notes (Signed)
Pt here today for routine visit and PN2. Pt denies any problems or issues at this time. PN2 today. Mild swellling on lower ext.  A: Stable IUP 28 wk. Hypothyroidism stable on lo dose synthroid 44 mcg/d.

## 2013-03-22 LAB — HSV 2 ANTIBODY, IGG: HSV 2 Glycoprotein G Ab, IgG: <0.1 IV

## 2013-03-22 LAB — RPR

## 2013-03-22 LAB — HIV ANTIBODY (ROUTINE TESTING W REFLEX): HIV: NONREACTIVE

## 2013-03-22 LAB — GLUCOSE TOLERANCE, 2 HOURS W/ 1HR: Glucose, 2 hour: 149 mg/dL — ABNORMAL HIGH (ref 70–139)

## 2013-04-16 ENCOUNTER — Telehealth: Payer: Self-pay | Admitting: Adult Health

## 2013-04-16 ENCOUNTER — Ambulatory Visit (INDEPENDENT_AMBULATORY_CARE_PROVIDER_SITE_OTHER): Payer: BC Managed Care – PPO | Admitting: Obstetrics and Gynecology

## 2013-04-16 VITALS — BP 108/48 | Wt 216.0 lb

## 2013-04-16 DIAGNOSIS — Z1389 Encounter for screening for other disorder: Secondary | ICD-10-CM

## 2013-04-16 DIAGNOSIS — O99019 Anemia complicating pregnancy, unspecified trimester: Secondary | ICD-10-CM

## 2013-04-16 DIAGNOSIS — Z331 Pregnant state, incidental: Secondary | ICD-10-CM

## 2013-04-16 DIAGNOSIS — E039 Hypothyroidism, unspecified: Secondary | ICD-10-CM

## 2013-04-16 DIAGNOSIS — E079 Disorder of thyroid, unspecified: Secondary | ICD-10-CM

## 2013-04-16 LAB — POCT URINALYSIS DIPSTICK
Ketones, UA: NEGATIVE
Protein, UA: NEGATIVE

## 2013-04-16 NOTE — Progress Notes (Signed)
No complaints at this time. BC discussed . Vasectomy reviewed.  Hypothyroidism:  TSH increased slightly, will alternate 44 mcgx 4d with 88 mcg M-W-F.. Consider reck at 36 wk.

## 2013-04-16 NOTE — Telephone Encounter (Signed)
Spoke with pt. Saw Dr. Emelda Fear this am. Dr. Dewayne Hatch to increase Levothyroxine to 88 mcg, three times a week and 44 mcg 4 days a week. Pt was looking online and saw that on the higher dose days, she could have more energy than on days with the lower dose, she could be fatigued. Pt's question was could you order 125 mcg and her half that daily to try and keep things more even. Uses [facility] in [place].

## 2013-04-18 ENCOUNTER — Encounter: Payer: BC Managed Care – PPO | Admitting: Advanced Practice Midwife

## 2013-04-18 MED ORDER — LEVOTHYROXINE SODIUM 125 MCG PO TABS: 125.0000 ug | ORAL_TABLET | Freq: Every day | ORAL | Status: DC

## 2013-04-18 NOTE — Progress Notes (Signed)
Rx changed to synthroid 62.5 mcg (1/2 tab) daily. Rx'd to pharmacy per pt request

## 2013-04-18 NOTE — Addendum Note (Signed)
Addended by: Tilda Burrow on: 04/18/2013 07:33 AM   Modules accepted: Orders

## 2013-04-18 NOTE — Telephone Encounter (Signed)
Pt informed Levothyroxine 125 mcg e-scribed.

## 2013-04-25 ENCOUNTER — Encounter: Payer: Self-pay | Admitting: Advanced Practice Midwife

## 2013-05-01 ENCOUNTER — Inpatient Hospital Stay (HOSPITAL_COMMUNITY)
Admission: AD | Admit: 2013-05-01 | Discharge: 2013-05-01 | Discharge status: Against Medical Advice | Payer: BC Managed Care – PPO | Attending: Obstetrics & Gynecology | Admitting: Obstetrics & Gynecology

## 2013-05-01 ENCOUNTER — Inpatient Hospital Stay (HOSPITAL_COMMUNITY)
Admission: AD | Admit: 2013-05-01 | Payer: BC Managed Care – PPO | Source: Ambulatory Visit | Admitting: Obstetrics and Gynecology

## 2013-05-02 ENCOUNTER — Encounter: Payer: Self-pay | Admitting: Advanced Practice Midwife

## 2013-05-02 ENCOUNTER — Ambulatory Visit (INDEPENDENT_AMBULATORY_CARE_PROVIDER_SITE_OTHER): Payer: BC Managed Care – PPO | Admitting: Advanced Practice Midwife

## 2013-05-02 VITALS — BP 110/60 | Wt 219.0 lb

## 2013-05-02 DIAGNOSIS — Z331 Pregnant state, incidental: Secondary | ICD-10-CM

## 2013-05-02 DIAGNOSIS — Z3483 Encounter for supervision of other normal pregnancy, third trimester: Secondary | ICD-10-CM

## 2013-05-02 DIAGNOSIS — O99019 Anemia complicating pregnancy, unspecified trimester: Secondary | ICD-10-CM

## 2013-05-02 DIAGNOSIS — E079 Disorder of thyroid, unspecified: Secondary | ICD-10-CM

## 2013-05-02 DIAGNOSIS — Z1389 Encounter for screening for other disorder: Secondary | ICD-10-CM

## 2013-05-02 LAB — POCT URINALYSIS DIPSTICK
Blood, UA: NEGATIVE
Ketones, UA: NEGATIVE

## 2013-05-02 NOTE — Progress Notes (Signed)
No c/o at this time. Feeling better (less sluggish) with med change  Size>dates, but baby doesn't really feel too big.  Pelvis proven to 9#2oz.  Will check EFW/AFI. Routine questions about pregnancy answered.  F/U in 2 weeks for LROB.

## 2013-05-10 ENCOUNTER — Ambulatory Visit (INDEPENDENT_AMBULATORY_CARE_PROVIDER_SITE_OTHER): Payer: BC Managed Care – PPO | Admitting: Advanced Practice Midwife

## 2013-05-10 ENCOUNTER — Encounter: Payer: Self-pay | Admitting: Advanced Practice Midwife

## 2013-05-10 ENCOUNTER — Other Ambulatory Visit: Payer: Self-pay | Admitting: Advanced Practice Midwife

## 2013-05-10 ENCOUNTER — Ambulatory Visit (INDEPENDENT_AMBULATORY_CARE_PROVIDER_SITE_OTHER): Payer: BC Managed Care – PPO

## 2013-05-10 VITALS — BP 110/64 | Wt 217.2 lb

## 2013-05-10 DIAGNOSIS — O3660X0 Maternal care for excessive fetal growth, unspecified trimester, not applicable or unspecified: Secondary | ICD-10-CM

## 2013-05-10 DIAGNOSIS — O99019 Anemia complicating pregnancy, unspecified trimester: Secondary | ICD-10-CM

## 2013-05-10 DIAGNOSIS — O3663X1 Maternal care for excessive fetal growth, third trimester, fetus 1: Secondary | ICD-10-CM

## 2013-05-10 DIAGNOSIS — O09299 Supervision of pregnancy with other poor reproductive or obstetric history, unspecified trimester: Secondary | ICD-10-CM

## 2013-05-10 DIAGNOSIS — Z331 Pregnant state, incidental: Secondary | ICD-10-CM

## 2013-05-10 DIAGNOSIS — E079 Disorder of thyroid, unspecified: Secondary | ICD-10-CM

## 2013-05-10 DIAGNOSIS — Z1389 Encounter for screening for other disorder: Secondary | ICD-10-CM

## 2013-05-10 DIAGNOSIS — O09293 Supervision of pregnancy with other poor reproductive or obstetric history, third trimester: Secondary | ICD-10-CM

## 2013-05-10 DIAGNOSIS — Z3483 Encounter for supervision of other normal pregnancy, third trimester: Secondary | ICD-10-CM

## 2013-05-10 LAB — POCT URINALYSIS DIPSTICK
Blood, UA: NEGATIVE
Glucose, UA: NEGATIVE

## 2013-05-10 NOTE — Patient Instructions (Addendum)

## 2013-05-10 NOTE — Progress Notes (Signed)
U/S(35+1wks)-vtx, active fetus, EFW 7 lb 1 oz (85th%tile), fluid WNL, ant gr 1 plac, AFI=7.3cm, female fetus

## 2013-05-10 NOTE — Progress Notes (Signed)
Muscle pain

## 2013-05-10 NOTE — Progress Notes (Signed)
Had U/S (see below)  No c/o at this time.  Routine questions about pregnancy answered.  F/U in 1 weeks for LROB.

## 2013-05-21 ENCOUNTER — Ambulatory Visit (INDEPENDENT_AMBULATORY_CARE_PROVIDER_SITE_OTHER): Payer: Self-pay | Admitting: Obstetrics & Gynecology

## 2013-05-21 ENCOUNTER — Encounter: Payer: Self-pay | Admitting: Obstetrics & Gynecology

## 2013-05-21 VITALS — BP 110/60 | Wt 219.0 lb

## 2013-05-21 DIAGNOSIS — Z3483 Encounter for supervision of other normal pregnancy, third trimester: Secondary | ICD-10-CM

## 2013-05-21 DIAGNOSIS — Z1389 Encounter for screening for other disorder: Secondary | ICD-10-CM

## 2013-05-21 DIAGNOSIS — E079 Disorder of thyroid, unspecified: Secondary | ICD-10-CM

## 2013-05-21 DIAGNOSIS — O99019 Anemia complicating pregnancy, unspecified trimester: Secondary | ICD-10-CM

## 2013-05-21 LAB — POCT URINALYSIS DIPSTICK
Leukocytes, UA: NEGATIVE
Nitrite, UA: NEGATIVE

## 2013-05-21 NOTE — Progress Notes (Signed)
For GBS/ GC/ CHL .

## 2013-05-21 NOTE — Progress Notes (Signed)
BP weight and urine results all reviewed and noted. Patient reports good fetal movement, denies any bleeding and no rupture of membranes symptoms or regular contractions. Patient is without complaints. All questions were answered.  

## 2013-05-22 LAB — GC/CHLAMYDIA PROBE AMP: GC Probe RNA: NEGATIVE

## 2013-05-23 LAB — STREP B DNA PROBE: GBSP: NEGATIVE

## 2013-05-29 ENCOUNTER — Ambulatory Visit (INDEPENDENT_AMBULATORY_CARE_PROVIDER_SITE_OTHER): Payer: BC Managed Care – PPO | Admitting: Advanced Practice Midwife

## 2013-05-29 ENCOUNTER — Encounter: Payer: Self-pay | Admitting: Advanced Practice Midwife

## 2013-05-29 VITALS — BP 128/60 | Wt 223.2 lb

## 2013-05-29 DIAGNOSIS — Z1389 Encounter for screening for other disorder: Secondary | ICD-10-CM

## 2013-05-29 DIAGNOSIS — E079 Disorder of thyroid, unspecified: Secondary | ICD-10-CM

## 2013-05-29 DIAGNOSIS — Z331 Pregnant state, incidental: Secondary | ICD-10-CM

## 2013-05-29 DIAGNOSIS — O99019 Anemia complicating pregnancy, unspecified trimester: Secondary | ICD-10-CM

## 2013-05-29 DIAGNOSIS — Z23 Encounter for immunization: Secondary | ICD-10-CM

## 2013-05-29 DIAGNOSIS — Z3483 Encounter for supervision of other normal pregnancy, third trimester: Secondary | ICD-10-CM

## 2013-05-29 LAB — POCT URINALYSIS DIPSTICK
Nitrite, UA: NEGATIVE
Protein, UA: NEGATIVE

## 2013-05-29 MED ORDER — INFLUENZA VAC SPLIT QUAD 0.5 ML IM SUSP
0.5000 mL | Freq: Once | INTRAMUSCULAR | Status: AC
  Administered 2013-05-29: 0.5 mL via INTRAMUSCULAR

## 2013-05-29 NOTE — Addendum Note (Signed)
Addended by: Richardson Chiquito on: 05/29/2013 09:47 AM   Modules accepted: Orders

## 2013-05-29 NOTE — Progress Notes (Signed)
Pt is more aware of contractions mostly during the night.   No c/o at this time.  Routine questions about pregnancy answered. Got flu shot  Discussed Tdap. in 1 weeks for LROB.

## 2013-05-31 ENCOUNTER — Encounter: Payer: Self-pay | Admitting: Advanced Practice Midwife

## 2013-06-01 ENCOUNTER — Other Ambulatory Visit: Payer: Self-pay | Admitting: Obstetrics & Gynecology

## 2013-06-01 MED ORDER — FLUCONAZOLE 150 MG PO TABS: 150.0000 mg | ORAL_TABLET | Freq: Once | ORAL | Status: DC

## 2013-06-05 ENCOUNTER — Ambulatory Visit (INDEPENDENT_AMBULATORY_CARE_PROVIDER_SITE_OTHER): Payer: Self-pay | Admitting: Women's Health

## 2013-06-05 ENCOUNTER — Ambulatory Visit (INDEPENDENT_AMBULATORY_CARE_PROVIDER_SITE_OTHER): Payer: BC Managed Care – PPO

## 2013-06-05 VITALS — BP 110/60 | Wt 222.0 lb

## 2013-06-05 DIAGNOSIS — O99019 Anemia complicating pregnancy, unspecified trimester: Secondary | ICD-10-CM

## 2013-06-05 DIAGNOSIS — Z3483 Encounter for supervision of other normal pregnancy, third trimester: Secondary | ICD-10-CM

## 2013-06-05 DIAGNOSIS — O3663X0 Maternal care for excessive fetal growth, third trimester, not applicable or unspecified: Secondary | ICD-10-CM

## 2013-06-05 DIAGNOSIS — E079 Disorder of thyroid, unspecified: Secondary | ICD-10-CM

## 2013-06-05 DIAGNOSIS — O26843 Uterine size-date discrepancy, third trimester: Secondary | ICD-10-CM

## 2013-06-05 DIAGNOSIS — Z1389 Encounter for screening for other disorder: Secondary | ICD-10-CM

## 2013-06-05 DIAGNOSIS — O3660X0 Maternal care for excessive fetal growth, unspecified trimester, not applicable or unspecified: Secondary | ICD-10-CM

## 2013-06-05 DIAGNOSIS — O26849 Uterine size-date discrepancy, unspecified trimester: Secondary | ICD-10-CM

## 2013-06-05 DIAGNOSIS — Z331 Pregnant state, incidental: Secondary | ICD-10-CM

## 2013-06-05 LAB — POCT URINALYSIS DIPSTICK
Ketones, UA: NEGATIVE
Leukocytes, UA: NEGATIVE
Protein, UA: NEGATIVE

## 2013-06-05 NOTE — Progress Notes (Signed)
No complaints at this time.

## 2013-06-05 NOTE — Patient Instructions (Signed)

## 2013-06-05 NOTE — Progress Notes (Signed)
U/S(38+6wks)-vtx active fetus, EFW 9 lb 14 oz (>97th%tile) 4463gms, fluid wnl AFI-11.7cm, ant gr 2 plac

## 2013-06-05 NOTE — Progress Notes (Signed)
Reports good fm. Denies regular/painful uc's, lof, vb, urinary frequency, urgency, hesitancy, or dysuria.  No complaints.  FH 43cm at 38.6wks, efw 4wks ago 85%, h/o 9lb baby, states she also had premature aging of placenta w/ 1st pregnancy. Offered membrane sweeping, declined at this time. Reviewed labor s/s, fkc.  All questions answered. F/U asap for growth/afi u/s for s>d, then 1wk for visit.

## 2013-06-05 NOTE — Progress Notes (Addendum)
1500: u/s efw 9lb14oz/4473g/>97% at 38.6wks, had 9lb2oz baby vaginally w/o complications 21yrs ago at 40.6wks. Discussed w/ [facility], no change in care needed.  Pt now requests membrane sweeping. Again discussed r/b- pt decided to proceed, so membranes swept. Cx 3-4/50/-2, vtx in pelvis. Reviewed labor s/s, fkc. To keep appt in 1wk as scheduled.

## 2013-06-12 ENCOUNTER — Ambulatory Visit (INDEPENDENT_AMBULATORY_CARE_PROVIDER_SITE_OTHER): Payer: Self-pay | Admitting: Women's Health

## 2013-06-12 VITALS — BP 120/72 | Wt 222.0 lb

## 2013-06-12 DIAGNOSIS — Z3483 Encounter for supervision of other normal pregnancy, third trimester: Secondary | ICD-10-CM

## 2013-06-12 DIAGNOSIS — E079 Disorder of thyroid, unspecified: Secondary | ICD-10-CM

## 2013-06-12 DIAGNOSIS — O99019 Anemia complicating pregnancy, unspecified trimester: Secondary | ICD-10-CM

## 2013-06-12 DIAGNOSIS — Z1389 Encounter for screening for other disorder: Secondary | ICD-10-CM

## 2013-06-12 LAB — POCT URINALYSIS DIPSTICK
Blood, UA: NEGATIVE
Ketones, UA: NEGATIVE
Nitrite, UA: NEGATIVE

## 2013-06-12 NOTE — Patient Instructions (Signed)
Your induction is scheduled for 10/8 @ 7:30am. Go to [facility] hospital, Maternity Admissions Unit (Emergency) entrance and let them know you are there to be induced. They will send someone from Labor & Delivery to come get you.    Braxton Hicks Contractions Pregnancy is commonly associated with contractions of the uterus throughout the pregnancy. Towards the end of pregnancy (32 to 34 weeks), these contractions [facility] Willa Rough) can develop more often and may become more forceful. This is not true labor because these contractions do not result in opening (dilatation) and thinning of the cervix. They are sometimes difficult to tell apart from true labor because these contractions can be forceful and people have different pain tolerances. You should not feel embarrassed if you go to the hospital with false labor. Sometimes, the only way to tell if you are in true labor is for your caregiver to follow the changes in the cervix. How to tell the difference between true and false labor:  False labor.  The contractions of false labor are usually shorter, irregular and not as hard as those of true labor.  They are often felt in the front of the lower abdomen and in the groin.  They may leave with walking around or changing positions while lying down.  They get weaker and are shorter lasting as time goes on.  These contractions are usually irregular.  They do not usually become progressively stronger, regular and closer together as with true labor.  True labor.  Contractions in true labor last 30 to 70 seconds, become very regular, usually become more intense, and increase in frequency.  They do not go away with walking.  The discomfort is usually felt in the top of the uterus and spreads to the lower abdomen and low back.  True labor can be determined by your caregiver with an exam. This will show that the cervix is dilating and getting thinner. If there are no prenatal problems or other health  problems associated with the pregnancy, it is completely safe to be sent home with false labor and await the onset of true labor. HOME CARE INSTRUCTIONS   Keep up with your usual exercises and instructions.  Take medications as directed.  Keep your regular prenatal appointment.  Eat and drink lightly if you think you are going into labor.  If BH contractions are making you uncomfortable:  Change your activity position from lying down or resting to walking/walking to resting.  Sit and rest in a tub of warm water.  Drink 2 to 3 glasses of water. Dehydration may cause B-H contractions.  Do slow and deep breathing several times an hour. SEEK IMMEDIATE MEDICAL CARE IF:   Your contractions continue to become stronger, more regular, and closer together.  You have a gushing, burst or leaking of fluid from the vagina.  An oral temperature above 102 F (38.9 C) develops.  You have passage of blood-tinged mucus.  You develop vaginal bleeding.  You develop continuous belly (abdominal) pain.  You have low back pain that you never had before.  You feel the baby's head pushing down causing pelvic pressure.  The baby is not moving as much as it used to. Document Released: 08/30/2005 Document Revised: 11/22/2011 Document Reviewed: 02/21/2009 [facility] Patient Information 2014 [place], [place].

## 2013-06-12 NOTE — Progress Notes (Signed)
Pt here today for routine visit. Pt denies any problems or concerns at this time.  

## 2013-06-12 NOTE — Progress Notes (Signed)
Reports good fm. Denies consistently regular uc's, lof, vb, urinary frequency, urgency, hesitancy, or dysuria.  Had regular uc's last night, that went away when she laid down.  Offered membrane sweeping again, discussed r/b- pt decided to proceed. I attempted to sweep membranes, but could not reach adequately, so membranes were swept by Dr. Despina Hidden, who states baby is vtx, but Rt oblique. Reviewed labor s/s, fkc.  All questions answered. IOL for PD scheduled 10/8 @ 0730 if needed. F/U in 1wk for visit.

## 2013-06-15 ENCOUNTER — Encounter (HOSPITAL_COMMUNITY): Payer: Self-pay | Admitting: *Deleted

## 2013-06-15 ENCOUNTER — Telehealth (HOSPITAL_COMMUNITY): Payer: Self-pay | Admitting: *Deleted

## 2013-06-15 NOTE — Telephone Encounter (Signed)
Preadmission screen  

## 2013-06-18 ENCOUNTER — Encounter: Payer: Self-pay | Admitting: Women's Health

## 2013-06-18 ENCOUNTER — Ambulatory Visit (INDEPENDENT_AMBULATORY_CARE_PROVIDER_SITE_OTHER): Payer: BC Managed Care – PPO | Admitting: Women's Health

## 2013-06-18 VITALS — BP 120/80 | Wt 228.0 lb

## 2013-06-18 DIAGNOSIS — O480 Post-term pregnancy: Secondary | ICD-10-CM

## 2013-06-18 DIAGNOSIS — Z331 Pregnant state, incidental: Secondary | ICD-10-CM

## 2013-06-18 DIAGNOSIS — O99019 Anemia complicating pregnancy, unspecified trimester: Secondary | ICD-10-CM

## 2013-06-18 DIAGNOSIS — IMO0002 Reserved for concepts with insufficient information to code with codable children: Secondary | ICD-10-CM

## 2013-06-18 DIAGNOSIS — E079 Disorder of thyroid, unspecified: Secondary | ICD-10-CM

## 2013-06-18 DIAGNOSIS — Z1389 Encounter for screening for other disorder: Secondary | ICD-10-CM

## 2013-06-18 DIAGNOSIS — O99280 Endocrine, nutritional and metabolic diseases complicating pregnancy, unspecified trimester: Secondary | ICD-10-CM

## 2013-06-18 DIAGNOSIS — Z3483 Encounter for supervision of other normal pregnancy, third trimester: Secondary | ICD-10-CM

## 2013-06-18 LAB — POCT URINALYSIS DIPSTICK
Ketones, UA: NEGATIVE
Leukocytes, UA: NEGATIVE
Nitrite, UA: NEGATIVE
Protein, UA: NEGATIVE

## 2013-06-18 NOTE — Patient Instructions (Addendum)
Fetal Movement Counts Patient Name: __________________________________________________ Patient Due Date: ____________________ Performing a fetal movement count is highly recommended in high-risk pregnancies, but it is good for every pregnant woman to do. Your caregiver may ask you to start counting fetal movements at 28 weeks of the pregnancy. Fetal movements often increase:  After eating a full meal.  After physical activity.  After eating or drinking something sweet or cold.  At rest. Pay attention to when you feel the baby is most active. This will help you notice a pattern of your baby's sleep and wake cycles and what factors contribute to an increase in fetal movement. It is important to perform a fetal movement count at the same time each day when your baby is normally most active.  HOW TO COUNT FETAL MOVEMENTS 1. Find a quiet and comfortable area to sit or lie down on your left side. Lying on your left side provides the best blood and oxygen circulation to your baby. 2. Write down the day and time on a sheet of paper or in a journal. 3. Start counting kicks, flutters, swishes, rolls, or jabs in a 2 hour period. You should feel at least 10 movements within 2 hours. 4. If you do not feel 10 movements in 2 hours, wait 2 3 hours and count again. Look for a change in the pattern or not enough counts in 2 hours. SEEK MEDICAL CARE IF:  You feel less than 10 counts in 2 hours, tried twice.  There is no movement in over an hour.  The pattern is changing or taking longer each day to reach 10 counts in 2 hours.  You feel the baby is not moving as he or she usually does. Date: ____________ Movements: ____________ Start time: ____________ Finish time: ____________  Date: ____________ Movements: ____________ Start time: ____________ Finish time: ____________ Date: ____________ Movements: ____________ Start time: ____________ Finish time: ____________ Date: ____________ Movements: ____________  Start time: ____________ Finish time: ____________ Date: ____________ Movements: ____________ Start time: ____________ Finish time: ____________ Date: ____________ Movements: ____________ Start time: ____________ Finish time: ____________ Date: ____________ Movements: ____________ Start time: ____________ Finish time: ____________ Date: ____________ Movements: ____________ Start time: ____________ Finish time: ____________  Date: ____________ Movements: ____________ Start time: ____________ Finish time: ____________ Date: ____________ Movements: ____________ Start time: ____________ Finish time: ____________ Date: ____________ Movements: ____________ Start time: ____________ Finish time: ____________ Date: ____________ Movements: ____________ Start time: ____________ Finish time: ____________ Date: ____________ Movements: ____________ Start time: ____________ Finish time: ____________ Date: ____________ Movements: ____________ Start time: ____________ Finish time: ____________ Date: ____________ Movements: ____________ Start time: ____________ Finish time: ____________  Date: ____________ Movements: ____________ Start time: ____________ Finish time: ____________ Date: ____________ Movements: ____________ Start time: ____________ Finish time: ____________ Date: ____________ Movements: ____________ Start time: ____________ Finish time: ____________ Date: ____________ Movements: ____________ Start time: ____________ Finish time: ____________ Date: ____________ Movements: ____________ Start time: ____________ Finish time: ____________ Date: ____________ Movements: ____________ Start time: ____________ Finish time: ____________ Date: ____________ Movements: ____________ Start time: ____________ Finish time: ____________  Date: ____________ Movements: ____________ Start time: ____________ Finish time: ____________ Date: ____________ Movements: ____________ Start time: ____________ Finish time:  ____________ Date: ____________ Movements: ____________ Start time: ____________ Finish time: ____________ Date: ____________ Movements: ____________ Start time: ____________ Finish time: ____________ Date: ____________ Movements: ____________ Start time: ____________ Finish time: ____________ Date: ____________ Movements: ____________ Start time: ____________ Finish time: ____________ Date: ____________ Movements: ____________ Start time: ____________ Finish time: ____________  Date: ____________ Movements: ____________ Start time: ____________ Finish   time: ____________ Date: ____________ Movements: ____________ Start time: ____________ Finish time: ____________ Date: ____________ Movements: ____________ Start time: ____________ Finish time: ____________ Date: ____________ Movements: ____________ Start time: ____________ Finish time: ____________ Date: ____________ Movements: ____________ Start time: ____________ Finish time: ____________ Date: ____________ Movements: ____________ Start time: ____________ Finish time: ____________ Date: ____________ Movements: ____________ Start time: ____________ Finish time: ____________  Date: ____________ Movements: ____________ Start time: ____________ Finish time: ____________ Date: ____________ Movements: ____________ Start time: ____________ Finish time: ____________ Date: ____________ Movements: ____________ Start time: ____________ Finish time: ____________ Date: ____________ Movements: ____________ Start time: ____________ Finish time: ____________ Date: ____________ Movements: ____________ Start time: ____________ Finish time: ____________ Date: ____________ Movements: ____________ Start time: ____________ Finish time: ____________ Date: ____________ Movements: ____________ Start time: ____________ Finish time: ____________  Date: ____________ Movements: ____________ Start time: ____________ Finish time: ____________ Date: ____________ Movements:  ____________ Start time: ____________ Finish time: ____________ Date: ____________ Movements: ____________ Start time: ____________ Finish time: ____________ Date: ____________ Movements: ____________ Start time: ____________ Finish time: ____________ Date: ____________ Movements: ____________ Start time: ____________ Finish time: ____________ Date: ____________ Movements: ____________ Start time: ____________ Finish time: ____________ Date: ____________ Movements: ____________ Start time: ____________ Finish time: ____________  Date: ____________ Movements: ____________ Start time: ____________ Finish time: ____________ Date: ____________ Movements: ____________ Start time: ____________ Finish time: ____________ Date: ____________ Movements: ____________ Start time: ____________ Finish time: ____________ Date: ____________ Movements: ____________ Start time: ____________ Finish time: ____________ Date: ____________ Movements: ____________ Start time: ____________ Finish time: ____________ Date: ____________ Movements: ____________ Start time: ____________ Finish time: ____________ Document Released: 09/29/2006 Document Revised: 08/16/2012 Document Reviewed: 06/26/2012 [employer] Patient Information 2014 [employer].  

## 2013-06-18 NOTE — Progress Notes (Signed)
Reports good fm. Denies uc's, lof, vb, urinary frequency, urgency, hesitancy, or dysuria.  No complaints.  Reviewed labor s/s, fkc.  Reactive NST. Declined membrane sweeping. IOL for PD scheduled 10/8 @ 0730. All questions answered.

## 2013-06-20 ENCOUNTER — Encounter (HOSPITAL_COMMUNITY): Payer: Self-pay

## 2013-06-20 ENCOUNTER — Inpatient Hospital Stay (HOSPITAL_COMMUNITY)
Admission: RE | Admit: 2013-06-20 | Discharge: 2013-06-22 | DRG: 373 | Disposition: A | Discharge status: Home / Self Care | Payer: BC Managed Care – PPO | Source: Ambulatory Visit | Attending: Obstetrics & Gynecology | Admitting: Obstetrics & Gynecology

## 2013-06-20 ENCOUNTER — Encounter (HOSPITAL_COMMUNITY): Payer: BC Managed Care – PPO | Admitting: Anesthesiology

## 2013-06-20 ENCOUNTER — Inpatient Hospital Stay (HOSPITAL_COMMUNITY): Payer: BC Managed Care – PPO | Admitting: Anesthesiology

## 2013-06-20 DIAGNOSIS — O480 Post-term pregnancy: Secondary | ICD-10-CM

## 2013-06-20 DIAGNOSIS — O3660X0 Maternal care for excessive fetal growth, unspecified trimester, not applicable or unspecified: Secondary | ICD-10-CM | POA: Diagnosis present

## 2013-06-20 DIAGNOSIS — E669 Obesity, unspecified: Secondary | ICD-10-CM

## 2013-06-20 DIAGNOSIS — E079 Disorder of thyroid, unspecified: Secondary | ICD-10-CM | POA: Diagnosis present

## 2013-06-20 DIAGNOSIS — E039 Hypothyroidism, unspecified: Secondary | ICD-10-CM | POA: Diagnosis present

## 2013-06-20 LAB — CBC
HCT: 36 % (ref 36.0–46.0)
Hemoglobin: 12.1 g/dL (ref 12.0–15.0)
MCH: 30 pg (ref 26.0–34.0)
MCV: 89.1 fL (ref 78.0–100.0)
RBC: 4.04 MIL/uL (ref 3.87–5.11)
RDW: 14.4 % (ref 11.5–15.5)

## 2013-06-20 LAB — TYPE AND SCREEN

## 2013-06-20 LAB — ABO/RH: ABO/RH(D): A POS

## 2013-06-20 MED ORDER — SENNOSIDES-DOCUSATE SODIUM 8.6-50 MG PO TABS
2.0000 | ORAL_TABLET | ORAL | Status: DC
  Administered 2013-06-20 – 2013-06-21 (×2): 2 via ORAL
  Filled 2013-06-20 (×2): qty 2

## 2013-06-20 MED ORDER — TETANUS-DIPHTH-ACELL PERTUSSIS 5-2.5-18.5 LF-MCG/0.5 IM SUSP
0.5000 mL | Freq: Once | INTRAMUSCULAR | Status: AC
  Administered 2013-06-21: 0.5 mL via INTRAMUSCULAR

## 2013-06-20 MED ORDER — IBUPROFEN 600 MG PO TABS
600.0000 mg | ORAL_TABLET | Freq: Four times a day (QID) | ORAL | Status: DC
  Administered 2013-06-20 – 2013-06-21 (×5): 600 mg via ORAL
  Filled 2013-06-20 (×7): qty 1

## 2013-06-20 MED ORDER — ZOLPIDEM TARTRATE 5 MG PO TABS: 5.0000 mg | ORAL_TABLET | Freq: Every evening | ORAL | Status: DC | PRN

## 2013-06-20 MED ORDER — LACTATED RINGERS IV SOLN: 500.0000 mL | INTRAVENOUS | Status: DC | PRN

## 2013-06-20 MED ORDER — OXYTOCIN 40 UNITS IN LACTATED RINGERS INFUSION - SIMPLE MED
1.0000 m[IU]/min | INTRAVENOUS | Status: DC
  Administered 2013-06-20: 2 m[IU]/min via INTRAVENOUS
  Filled 2013-06-20: qty 1000

## 2013-06-20 MED ORDER — DIPHENHYDRAMINE HCL 50 MG/ML IJ SOLN: 12.5000 mg | INTRAMUSCULAR | Status: DC | PRN

## 2013-06-20 MED ORDER — OXYTOCIN 40 UNITS IN LACTATED RINGERS INFUSION - SIMPLE MED: 62.5000 mL/h | INTRAVENOUS | Status: DC

## 2013-06-20 MED ORDER — PHENYLEPHRINE 40 MCG/ML (10ML) SYRINGE FOR IV PUSH (FOR BLOOD PRESSURE SUPPORT)
80.0000 ug | PREFILLED_SYRINGE | INTRAVENOUS | Status: DC | PRN
  Filled 2013-06-20: qty 2

## 2013-06-20 MED ORDER — FENTANYL 2.5 MCG/ML BUPIVACAINE 1/10 % EPIDURAL INFUSION (WH - ANES)
INTRAMUSCULAR | Status: DC | PRN
  Administered 2013-06-20: 14 mL/h via EPIDURAL

## 2013-06-20 MED ORDER — FENTANYL 2.5 MCG/ML BUPIVACAINE 1/10 % EPIDURAL INFUSION (WH - ANES)
14.0000 mL/h | INTRAMUSCULAR | Status: DC | PRN
  Filled 2013-06-20: qty 125

## 2013-06-20 MED ORDER — LACTATED RINGERS IV SOLN
INTRAVENOUS | Status: DC
  Administered 2013-06-20: 08:00:00 via INTRAVENOUS

## 2013-06-20 MED ORDER — LIDOCAINE HCL (PF) 1 % IJ SOLN
30.0000 mL | INTRAMUSCULAR | Status: DC | PRN
  Filled 2013-06-20: qty 30

## 2013-06-20 MED ORDER — IBUPROFEN 600 MG PO TABS: 600.0000 mg | ORAL_TABLET | Freq: Four times a day (QID) | ORAL | Status: DC | PRN

## 2013-06-20 MED ORDER — DIPHENHYDRAMINE HCL 25 MG PO CAPS: 25.0000 mg | ORAL_CAPSULE | Freq: Four times a day (QID) | ORAL | Status: DC | PRN

## 2013-06-20 MED ORDER — ONDANSETRON HCL 4 MG PO TABS: 4.0000 mg | ORAL_TABLET | ORAL | Status: DC | PRN

## 2013-06-20 MED ORDER — ONDANSETRON HCL 4 MG/2ML IJ SOLN: 4.0000 mg | Freq: Four times a day (QID) | INTRAMUSCULAR | Status: DC | PRN

## 2013-06-20 MED ORDER — BENZOCAINE-MENTHOL 20-0.5 % EX AERO
1.0000 "application " | INHALATION_SPRAY | CUTANEOUS | Status: DC | PRN
  Administered 2013-06-20: 1 via TOPICAL
  Filled 2013-06-20: qty 56

## 2013-06-20 MED ORDER — LIDOCAINE HCL (PF) 1 % IJ SOLN
INTRAMUSCULAR | Status: DC | PRN
  Administered 2013-06-20 (×2): 4 mL

## 2013-06-20 MED ORDER — PHENYLEPHRINE 40 MCG/ML (10ML) SYRINGE FOR IV PUSH (FOR BLOOD PRESSURE SUPPORT)
80.0000 ug | PREFILLED_SYRINGE | INTRAVENOUS | Status: DC | PRN
  Filled 2013-06-20: qty 5
  Filled 2013-06-20: qty 2

## 2013-06-20 MED ORDER — EPHEDRINE 5 MG/ML INJ
10.0000 mg | INTRAVENOUS | Status: DC | PRN
  Filled 2013-06-20: qty 2
  Filled 2013-06-20: qty 4

## 2013-06-20 MED ORDER — LANOLIN HYDROUS EX OINT: TOPICAL_OINTMENT | CUTANEOUS | Status: DC | PRN

## 2013-06-20 MED ORDER — TERBUTALINE SULFATE 1 MG/ML IJ SOLN: 0.2500 mg | Freq: Once | INTRAMUSCULAR | Status: DC | PRN

## 2013-06-20 MED ORDER — SIMETHICONE 80 MG PO CHEW: 80.0000 mg | CHEWABLE_TABLET | ORAL | Status: DC | PRN

## 2013-06-20 MED ORDER — PRENATAL MULTIVITAMIN CH
1.0000 | ORAL_TABLET | Freq: Every day | ORAL | Status: DC
  Administered 2013-06-21: 1 via ORAL
  Filled 2013-06-20 (×2): qty 1

## 2013-06-20 MED ORDER — WITCH HAZEL-GLYCERIN EX PADS: 1.0000 "application " | MEDICATED_PAD | CUTANEOUS | Status: DC | PRN

## 2013-06-20 MED ORDER — EPHEDRINE 5 MG/ML INJ
10.0000 mg | INTRAVENOUS | Status: DC | PRN
  Filled 2013-06-20: qty 2

## 2013-06-20 MED ORDER — OXYTOCIN BOLUS FROM INFUSION
500.0000 mL | INTRAVENOUS | Status: DC
  Administered 2013-06-20: 500 mL via INTRAVENOUS

## 2013-06-20 MED ORDER — CITRIC ACID-SODIUM CITRATE 334-500 MG/5ML PO SOLN: 30.0000 mL | ORAL | Status: DC | PRN

## 2013-06-20 MED ORDER — ONDANSETRON HCL 4 MG/2ML IJ SOLN: 4.0000 mg | INTRAMUSCULAR | Status: DC | PRN

## 2013-06-20 MED ORDER — OXYCODONE-ACETAMINOPHEN 5-325 MG PO TABS: 1.0000 | ORAL_TABLET | ORAL | Status: DC | PRN

## 2013-06-20 MED ORDER — LEVOTHYROXINE SODIUM 125 MCG PO TABS
62.5000 ug | ORAL_TABLET | Freq: Every day | ORAL | Status: DC
  Filled 2013-06-20: qty 0.5

## 2013-06-20 MED ORDER — LACTATED RINGERS IV SOLN: 500.0000 mL | Freq: Once | INTRAVENOUS | Status: DC

## 2013-06-20 MED ORDER — ACETAMINOPHEN 325 MG PO TABS: 650.0000 mg | ORAL_TABLET | ORAL | Status: DC | PRN

## 2013-06-20 MED ORDER — DIBUCAINE 1 % RE OINT: 1.0000 "application " | TOPICAL_OINTMENT | RECTAL | Status: DC | PRN

## 2013-06-20 NOTE — Anesthesia Procedure Notes (Signed)
Epidural Patient location during procedure: OB Start time: 06/20/2013 7:05 PM  Staffing Anesthesiologist: Alaa Eyerman A. Performed by: anesthesiologist   Preanesthetic Checklist Completed: patient identified, site marked, surgical consent, pre-op evaluation, timeout performed, IV checked, risks and benefits discussed and monitors and equipment checked  Epidural Patient position: sitting Prep: site prepped and draped and DuraPrep Patient monitoring: continuous pulse ox and blood pressure Approach: midline Injection technique: LOR air  Needle:  Needle type: Tuohy  Needle gauge: 17 G Needle length: 9 cm and 9 Needle insertion depth: 8 cm Catheter type: closed end flexible Catheter size: 19 Gauge Catheter at skin depth: 13 cm Test dose: negative and Other  Assessment Events: blood not aspirated, injection not painful, no injection resistance, negative IV test and no paresthesia  Additional Notes Patient identified. Risks and benefits discussed including failed block, incomplete  Pain control, post dural puncture headache, nerve damage, paralysis, blood pressure Changes, nausea, vomiting, reactions to medications-both toxic and allergic and post Partum back pain. All questions were answered. Patient expressed understanding and wished to proceed. Sterile technique was used throughout procedure. Epidural site was Dressed with sterile barrier dressing. No paresthesias, signs of intravascular injection Or signs of intrathecal spread were encountered.  Patient was more comfortable after the epidural was dosed. Please see RN's note for documentation of vital signs and FHR which are stable.

## 2013-06-20 NOTE — H&P (Cosign Needed)
Nicole [name] is a 32 y.o. female presenting for IOL for post dates. Pt states good fetal movement, occassional ctx, no lof, no vb, otherwise in usual state of health . Maternal Medical History:  Reason for admission: Nausea.    OB History   Grav Para Term Preterm Abortions TAB SAB Ect Mult Living   3 2 2       2      Past Medical History  Diagnosis Date  . Hypothyroidism    Past Surgical History  Procedure Laterality Date  . Mouth surgery     Family History: family history includes Hypertension in her father, maternal grandmother, and paternal grandmother. Social History:  reports that she has never smoked. She has never used smokeless tobacco. She reports that she does not drink alcohol or use illicit drugs.   Prenatal Transfer Tool  Maternal Diabetes: No Genetic Screening: Declined Maternal Ultrasounds/Referrals: Abnormal:  Findings:   Other: fetal macrosomia Fetal Ultrasounds or other Referrals:  None Maternal Substance Abuse:  No Significant Maternal Medications:  Meds include: Other: synthroid Significant Maternal Lab Results:  Lab values include: Group B Strep negative Other Comments:  None  Review of Systems  Constitutional: Negative for fever and chills.  HENT: Negative for hearing loss.   Eyes: Negative for blurred vision.  Respiratory: Negative for cough.   Cardiovascular: Negative for chest pain.  Gastrointestinal: Negative for heartburn, nausea, vomiting, abdominal pain, diarrhea and constipation.  Genitourinary: Negative for dysuria.  Neurological: Negative for headaches.   Filed Vitals:   06/20/13 0816  BP: 127/73  Pulse: 96  Temp: 98.5 F (36.9 C)  Resp: 18     Dilation: 4 Effacement (%): 50 Station: -2 Exam by:: odum Blood pressure 127/73, pulse 96, temperature 98.5 F (36.9 C), resp. rate 18, height 5\' 4"  (1.626 m), weight 100.699 kg (222 lb), last menstrual period 09/06/2012. Exam Physical Exam  Constitutional: She is oriented to  person, place, and time. She appears well-developed and well-nourished. No distress.  HENT:  Head: Normocephalic and atraumatic.  Eyes: EOM are normal.  Neck: Normal range of motion. Neck supple.  Cardiovascular: Normal rate, regular rhythm, normal heart sounds and intact distal pulses.  Exam reveals no gallop and no friction rub.   No murmur heard. Respiratory: Effort normal and breath sounds normal. No respiratory distress. She has no wheezes. She has no rales. She exhibits no tenderness.  GI: Soft. Bowel sounds are normal. She exhibits no distension (gravid leopold 4200g) and no mass. There is no tenderness. There is no rebound and no guarding.  Genitourinary: Vagina normal.  Musculoskeletal: She exhibits edema (1+ equal bil).  Neurological: She is alert and oriented to person, place, and time.  Skin: She is not diaphoretic.  Psychiatric: She has a normal mood and affect. Her behavior is normal. Judgment and thought content normal.    Prenatal labs: ABO, Rh: A/Positive/-- (02/12 0000) Antibody: NEG (07/09 0918) Rubella: Immune (02/12 0000) RPR: NON REAC (07/09 0918)  HBsAg: Negative (02/12 0000)  HIV: NON REACTIVE (07/09 0918)  GBS: NEGATIVE (09/08 1054)   150s mod var mult accels no decels q48min irr  Assessment/Plan: Nicole [name] is a 32 y.o. G3P2002 at [redacted]w[redacted]d her for IOL for PD   #Labor: Bishop score of 8, will start induction of labor with pitocin #Pain: Desires natural birth, but considering pain meds and epidural #FWB: Cat I #ID: Gbs neg #MOF: Breast #MOC: vasectomy #hypthyroid: Cont synthroid 62.5mg  qday   Ka Flammer RYAN 06/20/2013, 9:06  AM     

## 2013-06-20 NOTE — Progress Notes (Signed)
Patient ID: Nicole [name], female   DOB: Dec 22, 1980, 32 y.o.   MRN: [id number] Operative Delivery Note At 7:48 PM a viable female was delivered via Vaginal, Spontaneous Delivery.  Presentation: vertex; Position: Right,, Occiput,, Anterior; Station: .  Delivery of the head: 06/20/2013  7:46 PM First maneuver: 06/20/2013  7:46 PM, McRoberts Second maneuver: 06/20/2013  7:47 PM, Suprapubic Pressure Third maneuver: 06/20/2013  7:47 PM,  Unsuccessful effort to access anterior (left) axilla, due to shoulder being still above the edematous anterior lip of cervix Fourth maneuver: 06/20/2013  7:48 PM, Posterior arm delivered,after the body of baby rotated clockwise 180degrees so that right arm (originally posterior) delivered from the anterior position, then rest of body easily extracted.  At no time after identification of the shoulder dystocia was traction on the vertex performed. Fifth maneuver: ,   Sixth maneuver: ,    Verbal consent: obtained from patient.  APGAR: 1, 8; weight 11 lb (4990 g).   Placenta status: Intact, Spontaneous.   Cord: 3 vessels with the following complications: None.  Cord pH: 7.17  Anesthesia: Epidural  Episiotomy: None Lacerations: 2nd degree;Perineal Suture Repair: 2.0 vicryl Est. Blood Loss (mL): 500  Mom to postpartum.  Baby to nursery-stable, with left sholder weakness identified, .  Nicole [name] 06/20/2013, 10:05 PM

## 2013-06-20 NOTE — Progress Notes (Signed)
Nicole [name] is a 32 y.o. G3P2002 at [redacted]w[redacted]d admitted for induction of labor due to Post dates. Due date 06/13/2013.  Subjective: Comfortable on pitocin. No complaints  Objective: BP 110/65  Pulse 88  Temp(Src) 98.5 F (36.9 C)  Resp 18  Ht 5\' 4"  (1.626 m)  Wt 100.699 kg (222 lb)  BMI 38.09 kg/m2  LMP 09/06/2012      FHT:  FHR: 130s bpm, variability: moderate,  accelerations:  Present,  decelerations:  Absent UC:   regular, every q3 minutes SVE:   Dilation: 4 Effacement (%): 50 Station: -2 Exam by:: lee Did not repeat because pt is comfortable  Labs: Lab Results  Component Value Date   WBC 10.5 06/20/2013   HGB 12.1 06/20/2013   HCT 36.0 06/20/2013   MCV 89.1 06/20/2013   PLT 237 06/20/2013    Assessment / Plan: Induction of labor due to postterm,  Contracting well on pitocin  Labor: Progressing on Pitocin, will continue to increase then AROM Preeclampsia:  no signs or symptoms of toxicity Fetal Wellbeing:  Category I Pain Control:  Labor support without medications and may have meds as needed I/D:  n/a Anticipated MOD:  NSVD  Christan Defranco RYAN 06/20/2013, 1:26 PM

## 2013-06-20 NOTE — Anesthesia Preprocedure Evaluation (Addendum)
Anesthesia Evaluation  Patient identified by MRN, date of birth, ID band  Reviewed: Allergy & Precautions, H&P , Patient's Chart, lab work & pertinent test results  Airway Mallampati: III TM Distance: >3 FB Neck ROM: Full    Dental no notable dental hx. (+) Teeth Intact   Pulmonary neg pulmonary ROS,  breath sounds clear to auscultation  Pulmonary exam normal       Cardiovascular negative cardio ROS  Rhythm:Regular Rate:Normal     Neuro/Psych negative neurological ROS  negative psych ROS   GI/Hepatic Neg liver ROS, GERD-  Medicated and Controlled,  Endo/Other  Hypothyroidism Obesity  Renal/GU negative Renal ROS  negative genitourinary   Musculoskeletal negative musculoskeletal ROS (+)   Abdominal (+) + obese,   Peds  Hematology negative hematology ROS (+)   Anesthesia Other Findings   Reproductive/Obstetrics (+) Pregnancy Fetal macrosomia                           Anesthesia Physical Anesthesia Plan  ASA: II  Anesthesia Plan: Epidural   Post-op Pain Management:    Induction:   Airway Management Planned: Natural Airway  Additional Equipment:   Intra-op Plan:   Post-operative Plan:   Informed Consent: I have reviewed the patients History and Physical, chart, labs and discussed the procedure including the risks, benefits and alternatives for the proposed anesthesia with the patient or authorized representative who has indicated his/her understanding and acceptance.     Plan Discussed with: Anesthesiologist  Anesthesia Plan Comments:         Anesthesia Quick Evaluation

## 2013-06-21 LAB — CBC
HCT: 31.5 % — ABNORMAL LOW (ref 36.0–46.0)
MCHC: 34 g/dL (ref 30.0–36.0)
Platelets: 211 10*3/uL (ref 150–400)
RBC: 3.53 MIL/uL — ABNORMAL LOW (ref 3.87–5.11)
RDW: 14.6 % (ref 11.5–15.5)
WBC: 12.8 10*3/uL — ABNORMAL HIGH (ref 4.0–10.5)

## 2013-06-21 NOTE — Progress Notes (Signed)
Post Partum Day 1 Subjective: no complaints, up ad lib, voiding, tolerating PO and baby nursing well, baby has no significant left arm movement above hand at present, Peds aware and following  Pt and husb planning vasectomy as bc.  Objective: Blood pressure 94/54, pulse 104, temperature 99 F (37.2 C), temperature source Oral, resp. rate 18, height 5\' 4"  (1.626 m), weight 222 lb (100.699 kg), last menstrual period 09/06/2012, SpO2 97.00%, unknown if currently breastfeeding.  Physical Exam:  General: alert, cooperative and no distress Lochia: appropriate Uterine Fundus: firm Incision:  DVT Evaluation: No evidence of DVT seen on physical exam.   Recent Labs  06/20/13 0800 06/21/13 0605  HGB 12.1 10.7*  HCT 36.0 31.5*    Assessment/Plan: S/p shoulder dystocia  Of macrosomic infant,   Plan for discharge tomorrow, Breastfeeding and Contraception vasectomy   LOS: 1 day   Nicole [name] V 06/21/2013, 7:57 AM

## 2013-06-21 NOTE — Lactation Note (Signed)
This note was copied from the chart of Girl Priscila Bean. Lactation Consultation Note  Patient Name: Girl Ambrielle Kington [id number] Date: 06/21/2013 Reason for consult: Initial assessment Assisted Mom with obtaining more depth with latching baby as Mom denies nipple pain but feels she does not always have good depth with latch. Demonstrated "tea cup" hold to sandwich nipple to help with latch, demonstrated how to bring bottom lip down. Encouraged Mom to BF with feeding ques, at least every 3 hours. Cluster feeding reviewed. Lactation brochure left for review. Advised of OP services and support group. Engorgement care reviewed needed. Advised to call if would like LC assist. Mom wants early d/c today.   Maternal Data Formula Feeding for Exclusion: No Infant to breast within first hour of birth: Yes Has patient been taught Hand Expression?: No (Mom reports she knows how to hand express) Does the patient have breastfeeding experience prior to this delivery?: Yes  Feeding Feeding Type: Breast Fed Length of feed: 10 min  LATCH Score/Interventions Latch: Repeated attempts needed to sustain latch, nipple held in mouth throughout feeding, stimulation needed to elicit sucking reflex. Intervention(s): Adjust position;Assist with latch;Breast massage;Breast compression  Audible Swallowing: A few with stimulation  Type of Nipple: Everted at rest and after stimulation  Comfort (Breast/Nipple): Soft / non-tender     Hold (Positioning): Assistance needed to correctly position infant at breast and maintain latch. Intervention(s): Breastfeeding basics reviewed;Support Pillows;Position options;Skin to skin  LATCH Score: 7  Lactation Tools Discussed/Used WIC Program: No   Consult Status Consult Status: Complete    Alfred Levins 06/21/2013, 1:53 PM

## 2013-06-21 NOTE — Anesthesia Postprocedure Evaluation (Signed)
  Anesthesia Post-op Note  Patient: Nicole [name]  Procedure(s) Performed: * No procedures listed *  Patient Location: Mother/Baby  Anesthesia Type:Epidural  Level of Consciousness: awake, alert , oriented and patient cooperative  Airway and Oxygen Therapy: Patient Spontanous Breathing  Post-op Pain: mild  Post-op Assessment: Patient's Cardiovascular Status Stable, Respiratory Function Stable, No headache, No backache, No residual numbness and No residual motor weakness  Post-op Vital Signs: stable  Complications: No apparent anesthesia complications

## 2013-06-22 MED ORDER — IBUPROFEN 600 MG PO TABS: 600.0000 mg | ORAL_TABLET | Freq: Four times a day (QID) | ORAL | Status: DC

## 2013-06-22 NOTE — Discharge Summary (Signed)
Obstetric Discharge Summary Reason for Admission: induction of labor Prenatal Procedures: NST and ultrasound Intrapartum Procedures: spontaneous vaginal delivery Postpartum Procedures: none Complications-Operative and Postpartum: 2nd degree perineal laceration Hemoglobin  Date Value Range Status  06/21/2013 10.7* 12.0 - 15.0 g/dL Final  12/20/8117 14.7* 12.2 - 16.2 g/dL Final     HCT  Date Value Range Status  06/21/2013 31.5* 36.0 - 46.0 % Final    Physical Exam:  General: alert, cooperative, appears stated age and no distress Lochia: appropriate Uterine Fundus: firm DVT Evaluation: No evidence of DVT seen on physical exam. Negative Homan's sign. No cords or calf tenderness. No significant calf/ankle edema.  Discharge Diagnoses: Post-date pregnancy  Discharge Information: Date: 06/22/2013 Activity: unrestricted Diet: routine Medications: Ibuprofen Condition: stable Instructions: refer to practice specific booklet Discharge to: home    Newborn Data: Live born female  Birth Weight: 11 lb (4990 g) APGAR: 1, 8  Home with mother.  Bing Plume 06/22/2013, 7:32 AM  I have seen and examined this patient and agree the above assessment. CRESENZO-DISHMAN,Mavrick Mcquigg 06/22/2013 8:59 AM

## 2013-07-30 ENCOUNTER — Ambulatory Visit (INDEPENDENT_AMBULATORY_CARE_PROVIDER_SITE_OTHER): Payer: BC Managed Care – PPO | Admitting: Obstetrics and Gynecology

## 2013-07-30 ENCOUNTER — Ambulatory Visit: Payer: BC Managed Care – PPO | Admitting: Women's Health

## 2013-07-30 ENCOUNTER — Encounter: Payer: Self-pay | Admitting: Obstetrics and Gynecology

## 2013-07-31 NOTE — Progress Notes (Signed)
Patient ID: Nicole [name], female   DOB: [birth year], 32 y.o.   MRN: [id number]  Assessment:  1. Normal postpartum exam , slow healing of episiotomy.   Plan:  1. Patient's difficuly deliver reviewed, pt asks about if IOL would have helped earlier, I told pt that while IOL could have been considered at 40 wk, that it is not standard to induce to avoid macrosomia, however if babies are over 5000 gm , options of c/s are discussed. Pt seems comfortable with how the baby is doing.  Subjective:  Nicole [name] is a 32 y.o. female who presents for a postpartum visit.  I have fully reviewed the prenatal and intrapartum course, pt reports baby's brachial plexus injury is recovering steadily , and phys tx is not considered needed.  The baby does have some limitation of elbow flexion, and a less strong grip on left.   Patient is not sexually active.   The following portions of the patient's history were reviewed and updated as appropriate: allergies, current medications, past family history, past medical history, past surgical history and problem list.  Review of Systems See Subjective, otherwise negative ROS.  Objective:  BP 120/76  Ht 5\' 4"  (1.626 m)  Wt 204 lb 3.2 oz (92.625 kg)  BMI 35.03 kg/m2  Breastfeeding? Yes  General:  alert, cooperative and no distress     Lungs: clear to auscultation bilaterally  Heart:  regular rate and rhythm, S1, S2 normal, no murmur  Abdomen: soft, non-tender; bowel sounds normal; no masses,  no organomegaly   Vulva:  normal but slow healing, wll advise 2 wk abstinence then reassess.  Vagina: normal vagina  Cervix:  normal  Corpus: normal size, contour, position, consistency, mobility, non-tender  Adnexa:  normal adnexa

## 2013-11-01 ENCOUNTER — Other Ambulatory Visit: Payer: Self-pay | Admitting: Obstetrics and Gynecology

## 2013-11-05 ENCOUNTER — Telehealth: Payer: Self-pay | Admitting: Obstetrics and Gynecology

## 2013-11-05 NOTE — Telephone Encounter (Signed)
Pt's aware that refils x 3 months given. Pt asked to make f/u with dr Tanya NonesPickard to rechk TFT's. Also, pt reports baby is doing fine, with full recovery of left arm..Marland Kitchen

## 2013-11-05 NOTE — Telephone Encounter (Signed)
refil x 3 mos, will ask pt to make followup with us or with PC to recheck TFT's.

## 2014-04-07 ENCOUNTER — Other Ambulatory Visit: Payer: Self-pay | Admitting: Obstetrics and Gynecology

## 2014-05-31 ENCOUNTER — Ambulatory Visit (INDEPENDENT_AMBULATORY_CARE_PROVIDER_SITE_OTHER): Payer: BC Managed Care – PPO | Admitting: Family Medicine

## 2014-05-31 ENCOUNTER — Encounter: Payer: Self-pay | Admitting: Family Medicine

## 2014-05-31 ENCOUNTER — Other Ambulatory Visit: Payer: Self-pay | Admitting: Family Medicine

## 2014-05-31 VITALS — BP 136/80 | HR 80 | Temp 98.7°F | Resp 20 | Ht 64.0 in | Wt 206.0 lb

## 2014-05-31 DIAGNOSIS — E039 Hypothyroidism, unspecified: Secondary | ICD-10-CM

## 2014-05-31 NOTE — Progress Notes (Signed)
   Subjective:    Patient ID: Nicole [name], female    DOB: [birth year], 33 y.o.   MRN: [id number]  HPI Patient is a very pleasant 33 year old white female who comes in today for hypothyroidism. She is currently on levothyroxine 125 mcg by mouth daily. She denies any recent weight gain or weight loss. She denies any cold or heat intolerance. She denies any anxiety or palpitations at work constipation or diarrhea. She is overdue for TSH. Her blood pressure is borderline elevated today at 136/80. She had no issues with hypertension during her pregnancy. She denies any chest pain shortness of breath or dyspnea on exertion. She does have a family history of hypertension in her father. She also complains of some mild pain in the plantar aspect of her foot and the ball of her foot. Past Medical History  Diagnosis Date  . Hypothyroidism    Past Surgical History  Procedure Laterality Date  . Mouth surgery     Current Outpatient Prescriptions on File Prior to Visit  Medication Sig Dispense Refill  . Calcium Carbonate Antacid (ALKA-SELTZER ANTACID PO) Take 3 tablets by mouth 2 (two) times daily as needed (heartburn).       Marland Kitchen levothyroxine (SYNTHROID, LEVOTHROID) 125 MCG tablet take 1/2 tablet by mouth every morning BEFORE BREAKFAST  30 tablet  2  . omeprazole (PRILOSEC) 20 MG capsule Take 20 mg by mouth daily.       No current facility-administered medications on file prior to visit.   No Known Allergies History   Social History  . Marital Status: Married    Spouse Name: N/A    Number of Children: N/A  . Years of Education: N/A   Occupational History  . Not on file.   Social History Main Topics  . Smoking status: Never Smoker   . Smokeless tobacco: Never Used  . Alcohol Use: No  . Drug Use: No  . Sexual Activity: Not Currently    Birth Control/ Protection: None   Other Topics Concern  . Not on file   Social History Narrative  . No narrative on file      Review of Systems  All  other systems reviewed and are negative.      Objective:   Physical Exam  Vitals reviewed. Constitutional: She appears well-developed and well-nourished.  Neck: Neck supple. No thyromegaly present.  Cardiovascular: Normal rate, regular rhythm and normal heart sounds.  Exam reveals no gallop and no friction rub.   No murmur heard. Pulmonary/Chest: Effort normal and breath sounds normal. No respiratory distress. She has no wheezes. She has no rales. She exhibits no tenderness.  Abdominal: Soft. Bowel sounds are normal. She exhibits no distension. There is no tenderness. There is no rebound and no guarding.  Musculoskeletal: She exhibits no edema.          Assessment & Plan:  Unspecified hypothyroidism - Plan: CBC with Differential, COMPLETE METABOLIC PANEL WITH GFR, TSH   Check TSH. I will titrate levo thyroxine to achieve a TSH within therapeutic limits. Also gave patient flu vaccine today. We discussed increasing aerobic exercise and low sodium diet to help address her elevated blood pressure. I have asked the patient to monitor her blood pressure more frequently over the next 2 weeks and return to me the values so we can determine if the prehypertension should be added to her PMH.

## 2014-06-01 LAB — CBC WITH DIFFERENTIAL/PLATELET
Basophils Absolute: 0 10*3/uL (ref 0.0–0.1)
Basophils Relative: 0 % (ref 0–1)
Eosinophils Absolute: 0.2 10*3/uL (ref 0.0–0.7)
Eosinophils Relative: 2 % (ref 0–5)
HCT: 37.4 % (ref 36.0–46.0)
HEMOGLOBIN: 12.5 g/dL (ref 12.0–15.0)
Lymphocytes Relative: 37 % (ref 12–46)
Lymphs Abs: 3.9 10*3/uL (ref 0.7–4.0)
MCH: 29.3 pg (ref 26.0–34.0)
MCHC: 33.4 g/dL (ref 30.0–36.0)
MCV: 87.8 fL (ref 78.0–100.0)
MONOS PCT: 8 % (ref 3–12)
Monocytes Absolute: 0.8 10*3/uL (ref 0.1–1.0)
Neutro Abs: 5.6 10*3/uL (ref 1.7–7.7)
Neutrophils Relative %: 53 % (ref 43–77)
Platelets: 388 10*3/uL (ref 150–400)
RBC: 4.26 MIL/uL (ref 3.87–5.11)
RDW: 13.1 % (ref 11.5–15.5)
WBC: 10.6 10*3/uL — ABNORMAL HIGH (ref 4.0–10.5)

## 2014-06-01 LAB — COMPLETE METABOLIC PANEL WITH GFR
ALBUMIN: 4.1 g/dL (ref 3.5–5.2)
ALT: 18 U/L (ref 0–35)
AST: 13 U/L (ref 0–37)
Alkaline Phosphatase: 70 U/L (ref 39–117)
BUN: 10 mg/dL (ref 6–23)
CO2: 27 mEq/L (ref 19–32)
Calcium: 9.1 mg/dL (ref 8.4–10.5)
Chloride: 103 mEq/L (ref 96–112)
Creat: 0.64 mg/dL (ref 0.50–1.10)
GFR, Est African American: >89 mL/min
GLUCOSE: 130 mg/dL — AB (ref 70–99)
POTASSIUM: 4.1 meq/L (ref 3.5–5.3)
SODIUM: 138 meq/L (ref 135–145)
Total Bilirubin: 0.3 mg/dL (ref 0.2–1.2)
Total Protein: 6.9 g/dL (ref 6.0–8.3)

## 2014-06-01 LAB — TSH: TSH: 1.371 u[IU]/mL (ref 0.350–4.500)

## 2014-06-03 LAB — HEMOGLOBIN A1C
Hgb A1c MFr Bld: 6 % — ABNORMAL HIGH (ref <5.7)
Mean Plasma Glucose: 126 mg/dL — ABNORMAL HIGH (ref <117)

## 2014-06-06 ENCOUNTER — Telehealth: Payer: Self-pay | Admitting: Family Medicine

## 2014-06-06 NOTE — Telephone Encounter (Signed)
Message copied by Donne Anon on Thu Jun 06, 2014  8:42 AM ------      Message from: Lynnea Ferrier      Created: Tue Jun 04, 2014  6:56 AM       Labs show mild elevation in blood sugars.  Try to decrease carbs in diet and increase aerobic exercise to help address. ------

## 2014-06-06 NOTE — Telephone Encounter (Signed)
Pt aware of lab results and provider recommendations.  mailed patient a "counting carb" information sheet.

## 2014-06-24 ENCOUNTER — Ambulatory Visit (INDEPENDENT_AMBULATORY_CARE_PROVIDER_SITE_OTHER): Payer: BC Managed Care – PPO | Admitting: Family Medicine

## 2014-06-24 ENCOUNTER — Encounter: Payer: Self-pay | Admitting: Family Medicine

## 2014-06-24 VITALS — BP 126/64 | HR 84 | Temp 98.4°F | Resp 12 | Ht 64.0 in | Wt 202.0 lb

## 2014-06-24 DIAGNOSIS — N3000 Acute cystitis without hematuria: Secondary | ICD-10-CM

## 2014-06-24 DIAGNOSIS — R300 Dysuria: Secondary | ICD-10-CM

## 2014-06-24 LAB — URINALYSIS, ROUTINE W REFLEX MICROSCOPIC
Bilirubin Urine: NEGATIVE
GLUCOSE, UA: NEGATIVE mg/dL
Ketones, ur: NEGATIVE mg/dL
NITRITE: NEGATIVE
PH: 6 (ref 5.0–8.0)
Protein, ur: NEGATIVE mg/dL
Urobilinogen, UA: 0.2 mg/dL (ref 0.0–1.0)

## 2014-06-24 LAB — URINALYSIS, MICROSCOPIC ONLY
CASTS: NONE SEEN
CRYSTALS: NONE SEEN

## 2014-06-24 MED ORDER — FLUCONAZOLE 150 MG PO TABS: 150.0000 mg | ORAL_TABLET | Freq: Once | ORAL | Status: DC

## 2014-06-24 MED ORDER — NITROFURANTOIN MONOHYD MACRO 100 MG PO CAPS: 100.0000 mg | ORAL_CAPSULE | Freq: Two times a day (BID) | ORAL | Status: DC

## 2014-06-24 NOTE — Patient Instructions (Signed)
Take antibiotics as prescribed COntinue fluids F/U as needed

## 2014-06-24 NOTE — Progress Notes (Signed)
Patient ID: Nicole [name], female   DOB: 11-27-1980, 33 y.o.   MRN: [id number]   Subjective:    Patient ID: Nicole JunglingSarah Square, female    DOB: 11-27-1980, 33 y.o.   MRN: [id number]  Patient presents for Possible UTI Dysuria on and off since last Thursday. Low grade fever sat and back pain, now resolved. Taking AZO and pushing fluids, no N/V, no abd pain. No vaginal discharge. No history of recurrent UTI    Review Of Systems:  GEN- denies fatigue, fever, weight loss,weakness, recent illness HEENT- denies eye drainage, change in vision, nasal discharge, CVS- denies chest pain, palpitations RESP- denies SOB, cough, wheeze ABD- denies N/V, change in stools, abd pain GU- +dysuria, denies hematuria, dribbling, incontinence        Objective:    BP 126/64  Pulse 84  Temp(Src) 98.4 F (36.9 C) (Oral)  Resp 12  Ht 5\' 4"  (1.626 m)  Wt 202 lb (91.627 kg)  BMI 34.66 kg/m2  LMP 06/06/2014  Breastfeeding? Yes GEN- NAD, alert and oriented x3 CVS- RRR, no murmur RESP-CTAB ABD-NABS,soft,NT,ND, no CVA tenderness EXT- No edema         Assessment & Plan:      Problem List Items Addressed This Visit   None    Visit Diagnoses   Burning with urination    -  Primary    Relevant Orders       Urinalysis, Routine w reflex microscopic (Completed)    Acute cystitis without hematuria        Treat with macrobid , AZO, umcomplicated UTI       Note: This dictation was prepared with Dragon dictation along with smaller phrase technology. Any transcriptional errors that result from this process are unintentional.

## 2014-07-15 ENCOUNTER — Encounter: Payer: Self-pay | Admitting: Family Medicine

## 2014-11-26 ENCOUNTER — Other Ambulatory Visit: Payer: Self-pay | Admitting: Obstetrics and Gynecology

## 2014-11-26 NOTE — Telephone Encounter (Signed)
Needs appt for continued refils

## 2015-05-22 ENCOUNTER — Other Ambulatory Visit: Payer: Self-pay | Admitting: Obstetrics and Gynecology

## 2015-08-15 ENCOUNTER — Ambulatory Visit (INDEPENDENT_AMBULATORY_CARE_PROVIDER_SITE_OTHER): Payer: BC Managed Care – PPO | Admitting: Family Medicine

## 2015-08-15 ENCOUNTER — Encounter: Payer: Self-pay | Admitting: Family Medicine

## 2015-08-15 VITALS — BP 124/78 | HR 72 | Temp 98.5°F | Resp 18 | Wt 205.0 lb

## 2015-08-15 DIAGNOSIS — J019 Acute sinusitis, unspecified: Secondary | ICD-10-CM

## 2015-08-15 MED ORDER — FLUCONAZOLE 150 MG PO TABS: 150.0000 mg | ORAL_TABLET | Freq: Once | ORAL | Status: DC

## 2015-08-15 MED ORDER — AMOXICILLIN-POT CLAVULANATE 875-125 MG PO TABS: 1.0000 | ORAL_TABLET | Freq: Two times a day (BID) | ORAL | Status: DC

## 2015-08-15 NOTE — Progress Notes (Signed)
   Subjective:    Patient ID: Nicole JunglingSarah [name], female    DOB: 31-Jul-1981, 34 y.o.   MRN: [id number]  HPI  symptoms began more than a week ago. She reports pain in both maxillary sinuses. She reports significant head congestion and rhinorrhea. She reports headaches.  she reports pressure and pain in both years. She reports postnasal drip. She has waited at least a week and tried over-the-counter remedies with no beefit. She also complains of pain in her upper teet Past Medical History  Diagnosis Date  . Hypothyroidism    Past Surgical History  Procedure Laterality Date  . Mouth surgery     Current Outpatient Prescriptions on File Prior to Visit  Medication Sig Dispense Refill  . levothyroxine (SYNTHROID, LEVOTHROID) 125 MCG tablet tablet 1/2 tablet every morning with food 30 tablet 6  . omeprazole (PRILOSEC) 20 MG capsule Take 20 mg by mouth daily.     No current facility-administered medications on file prior to visit.   No Known Allergies Social History   Social History  . Marital Status: Married    Spouse Name: N/A  . Number of Children: N/A  . Years of Education: N/A   Occupational History  . Not on file.   Social History Main Topics  . Smoking status: Never Smoker   . Smokeless tobacco: Never Used  . Alcohol Use: No  . Drug Use: No  . Sexual Activity: Not Currently    Birth Control/ Protection: None   Other Topics Concern  . Not on file   Social History Narrative     Review of Systems  All other systems reviewed and are negative.      Objective:   Physical Exam  Constitutional: She appears well-developed and well-nourished.  HENT:  Right Ear: Tympanic membrane, external ear and ear canal normal.  Left Ear: Tympanic membrane, external ear and ear canal normal.  Nose: Mucosal edema and rhinorrhea present. Right sinus exhibits maxillary sinus tenderness. Left sinus exhibits maxillary sinus tenderness.  Mouth/Throat: Oropharynx is clear and moist. No  oropharyngeal exudate.  Eyes: Conjunctivae are normal.  Neck: Neck supple.  Cardiovascular: Normal rate, regular rhythm and normal heart sounds.   No murmur heard. Pulmonary/Chest: Effort normal and breath sounds normal. No respiratory distress. She has no wheezes. She has no rales.  Lymphadenopathy:    She has no cervical adenopathy.  Vitals reviewed.         Assessment & Plan:  Acute rhinosinusitis   begin Augmentin 875 milligrams by mouth twice a day for 10 days. I did give the patient prescription for Diflucan in case she develops a yeast infection.

## 2015-08-20 ENCOUNTER — Telehealth: Payer: Self-pay | Admitting: Family Medicine

## 2015-08-20 NOTE — Telephone Encounter (Signed)
Called and spoke to pt and she states that she is having some stomach pain and her temp was 100 last pm. She is taking the antibx on an empty stomach and not eating yogurt or taking a probiotic. She started the antibx on 08/15/15 and took diflucan on 08/18/15 as she states that she is prone to them and usually gets them rather quickly but would not tell me what symptoms she was having for yeast infection. I recommended that she take the antibx with food and to try an eat some yogurt until she received her probiotic in the mail as she ordered this over the weekend. If she continues to have pain in her to call us back and we could change the antibiotic. She verbalized understanding.

## 2015-08-20 NOTE — Telephone Encounter (Signed)
[phone] Patient calling to speak with you regarding the antibiotic she was prescribed, and her condition she is having now

## 2015-08-21 ENCOUNTER — Telehealth: Payer: Self-pay | Admitting: Family Medicine

## 2015-08-21 DIAGNOSIS — J019 Acute sinusitis, unspecified: Secondary | ICD-10-CM

## 2015-08-21 NOTE — Telephone Encounter (Signed)
I would refrain from this due to potential liver irritation.  I would take 1 and then repeat x 1 in 1 week if persistent.

## 2015-08-21 NOTE — Telephone Encounter (Signed)
LMTRC

## 2015-08-21 NOTE — Telephone Encounter (Signed)
Patient would like a triple dose of diflucan if possible, she says she still has yeast infection after taking antibiotic  [facility] [place]  [phone]

## 2015-08-21 NOTE — Telephone Encounter (Signed)
Says gets really bad yeast infections after antibiotics.  Says GYN would call in triple dose when she was pregnant.  Took one QD for 3 days.  Says it works well.  Can she get that from you??

## 2015-08-22 MED ORDER — FLUCONAZOLE 150 MG PO TABS: 150.0000 mg | ORAL_TABLET | Freq: Once | ORAL | Status: DC

## 2015-08-22 NOTE — Telephone Encounter (Signed)
Spoke to patient and Rx called in

## 2016-05-03 ENCOUNTER — Ambulatory Visit (INDEPENDENT_AMBULATORY_CARE_PROVIDER_SITE_OTHER): Payer: BLUE CROSS/BLUE SHIELD | Admitting: Family Medicine

## 2016-05-03 VITALS — BP 128/84 | HR 99 | Temp 99.8°F | Wt 199.0 lb

## 2016-05-03 DIAGNOSIS — R509 Fever, unspecified: Secondary | ICD-10-CM | POA: Diagnosis not present

## 2016-05-03 LAB — STREP GROUP A AG, W/REFLEX TO CULT: STREGTOCOCCUS GROUP A AG SCREEN: NOT DETECTED

## 2016-05-03 MED ORDER — BENZONATATE 100 MG PO CAPS: 200.0000 mg | ORAL_CAPSULE | Freq: Three times a day (TID) | ORAL | 0 refills | Status: DC | PRN

## 2016-05-03 NOTE — Progress Notes (Signed)
Subjective:    Patient ID: Nicole JunglingSarah [name], female    DOB: 15-Mar-1981, [age] y.o.   MRN: [id number]  HPI One child had pneumonia last week. 2 of her other children have been dealing with hand-foot-and-mouth disease. Starting Wednesday of last week she developed a low-grade fever. She also developed a nonproductive cough and a sore throat. Fever was 99-100. Saturday her fever became as high as 103.0. She continues to have a severe sore throat. She also continues to have a nonproductive cough. There is no rash on her hands or on her feet. There is no lymphadenopathy in the neck. Her lungs are clear to auscultation bilaterally. There is no nausea vomiting or diarrhea. Past Medical History:  Diagnosis Date  . Hypothyroidism    Past Surgical History:  Procedure Laterality Date  . MOUTH SURGERY     Current Outpatient Prescriptions on File Prior to Visit  Medication Sig Dispense Refill  . amoxicillin-clavulanate (AUGMENTIN) 875-125 MG tablet Take 1 tablet by mouth 2 (two) times daily. 20 tablet 0  . fluconazole (DIFLUCAN) 150 MG tablet Take 1 tablet (150 mg total) by mouth once. And repeat in 1 week if needed 2 tablet 0  . levothyroxine (SYNTHROID, LEVOTHROID) 125 MCG tablet tablet 1/2 tablet every morning with food 30 tablet 6  . omeprazole (PRILOSEC) 20 MG capsule Take 20 mg by mouth daily.     No current facility-administered medications on file prior to visit.    No Known Allergies Social History   Social History  . Marital status: Married    Spouse name: N/A  . Number of children: N/A  . Years of education: N/A   Occupational History  . Not on file.   Social History Main Topics  . Smoking status: Never Smoker  . Smokeless tobacco: Never Used  . Alcohol use No  . Drug use: No  . Sexual activity: Not Currently    Birth control/ protection: None   Other Topics Concern  . Not on file   Social History Narrative  . No narrative on file      Review of Systems  All other  systems reviewed and are negative.      Objective:   Physical Exam  Constitutional: She appears well-developed and well-nourished.  HENT:  Right Ear: External ear normal.  Left Ear: External ear normal.  Nose: Nose normal.  Mouth/Throat: Posterior oropharyngeal erythema present. No oropharyngeal exudate, posterior oropharyngeal edema or tonsillar abscesses.  Neck: Neck supple.  Cardiovascular: Normal rate, regular rhythm and normal heart sounds.   No murmur heard. Pulmonary/Chest: Effort normal and breath sounds normal. No respiratory distress. She has no wheezes. She has no rales.  Lymphadenopathy:    She has no cervical adenopathy.  Vitals reviewed.         Assessment & Plan:  Fever, unspecified - Plan: STREP GROUP A AG, W/REFLEX TO CULT  Symptoms are consistent with a viral syndrome. I see no evidence of pneumonia on her physical exam. I will perform a strep screen to evaluate for strep throat although I believe the likelihood of this is unlikely given the fact she has a cough associated with a sore throat and there is no lymphadenopathy in the neck. If strep screen is negative, I will treat the patient is a viral upper respiratory infection. I will give the patient Tessalon Perles for her cough and would recommend alternating ibuprofen and Tylenol to manage the fever. Symptoms should continue to improve after today and be completely resolved  over the next 48-72 hours

## 2016-05-21 ENCOUNTER — Other Ambulatory Visit: Payer: Self-pay | Admitting: Obstetrics and Gynecology

## 2016-05-23 ENCOUNTER — Other Ambulatory Visit: Payer: Self-pay | Admitting: Family Medicine

## 2016-07-16 DIAGNOSIS — Z23 Encounter for immunization: Secondary | ICD-10-CM | POA: Diagnosis not present

## 2016-09-28 ENCOUNTER — Encounter: Payer: Self-pay | Admitting: Adult Health

## 2016-09-28 ENCOUNTER — Other Ambulatory Visit (HOSPITAL_COMMUNITY)
Admission: RE | Admit: 2016-09-28 | Discharge: 2016-09-28 | Disposition: A | Discharge status: Home / Self Care | Payer: BLUE CROSS/BLUE SHIELD | Source: Ambulatory Visit | Attending: Adult Health | Admitting: Adult Health

## 2016-09-28 ENCOUNTER — Ambulatory Visit (INDEPENDENT_AMBULATORY_CARE_PROVIDER_SITE_OTHER): Payer: BLUE CROSS/BLUE SHIELD | Admitting: Adult Health

## 2016-09-28 VITALS — BP 110/68 | HR 82 | Ht 64.5 in | Wt 207.5 lb

## 2016-09-28 DIAGNOSIS — E039 Hypothyroidism, unspecified: Secondary | ICD-10-CM | POA: Diagnosis not present

## 2016-09-28 DIAGNOSIS — Z01419 Encounter for gynecological examination (general) (routine) without abnormal findings: Secondary | ICD-10-CM | POA: Insufficient documentation

## 2016-09-28 DIAGNOSIS — Z1151 Encounter for screening for human papillomavirus (HPV): Secondary | ICD-10-CM | POA: Insufficient documentation

## 2016-09-28 NOTE — Progress Notes (Signed)
Patient ID: Nicole JunglingSarah [name], female   DOB: 11-14-1980, 36 y.o.   MRN: [id number] History of Present Illness:  Nicole [name] is a 36 year old white female, married, G3P3 in for a well woman gyn exam and pap.no complaints.She is home schooling.  PCP is DR Pinkard.   Current Medications, Allergies, Past Medical History, Past Surgical History, Family History and Social History were reviewed in [facility] electronic medical record.     Review of Systems: Patient denies any headaches, hearing loss, fatigue, blurred vision, shortness of breath, chest pain, abdominal pain, problems with bowel movements, urination, or intercourse. No joint pain or mood swings.Periods are regular.    Physical Exam:BP 110/68 (BP Location: Left Arm, Patient Position: Sitting, Cuff Size: Large)   Pulse 82   Ht 5' 4.5" (1.638 m)   Wt 207 lb 8 oz (94.1 kg)   LMP 09/09/2016 (Exact Date)   Breastfeeding? No   BMI 35.07 kg/m  General:  Well developed, well nourished, no acute distress Skin:  Warm and dry Neck:  Midline trachea, normal thyroid, good ROM, no lymphadenopathy Lungs; Clear to auscultation bilaterally Breast:  No dominant palpable mass, retraction, or nipple discharge Cardiovascular: Regular rate and rhythm Abdomen:  Soft, non tender, no hepatosplenomegaly Pelvic:  External genitalia is normal in appearance, no lesions.  The vagina is normal in appearance. Urethra has no lesions or masses. The cervix is bulbous, and has nabothian cyst at 1 o'clock and is everted at os, pap with HPV performed.  Uterus is felt to be normal size, shape, and contour.  No adnexal masses or tenderness noted.Bladder is non tender, no masses felt. Extremities/musculoskeletal:  No swelling or varicosities noted, no clubbing or cyanosis Psych:  No mood changes, alert and cooperative,seems happy PHQ 2 score 0  Impression: 1. Encounter for gynecological examination with Papanicolaou smear of cervix   2. Hypothyroidism, unspecified type        Plan: Check TSH and free T4 Physical in 1 year Pap in 3 if normal Mammogram at 40

## 2016-09-29 LAB — T4, FREE: FREE T4: 1.32 ng/dL (ref 0.82–1.77)

## 2016-09-29 LAB — TSH: TSH: 1.66 u[IU]/mL (ref 0.450–4.500)

## 2016-09-30 LAB — CYTOLOGY - PAP
DIAGNOSIS: NEGATIVE
HPV (WINDOPATH): NOT DETECTED

## 2016-12-17 ENCOUNTER — Encounter: Payer: Self-pay | Admitting: Family Medicine

## 2016-12-17 ENCOUNTER — Ambulatory Visit (INDEPENDENT_AMBULATORY_CARE_PROVIDER_SITE_OTHER): Payer: BLUE CROSS/BLUE SHIELD | Admitting: Family Medicine

## 2016-12-17 VITALS — BP 110/80 | HR 90 | Temp 98.0°F | Resp 14 | Wt 213.0 lb

## 2016-12-17 DIAGNOSIS — F321 Major depressive disorder, single episode, moderate: Secondary | ICD-10-CM | POA: Diagnosis not present

## 2016-12-17 DIAGNOSIS — R5383 Other fatigue: Secondary | ICD-10-CM | POA: Diagnosis not present

## 2016-12-17 LAB — CBC WITH DIFFERENTIAL/PLATELET
Basophils Absolute: 0 cells/uL (ref 0–200)
Basophils Relative: 0 %
Eosinophils Absolute: 204 cells/uL (ref 15–500)
Eosinophils Relative: 2 %
HCT: 38 % (ref 35.0–45.0)
Hemoglobin: 12.8 g/dL (ref 12.0–15.0)
Lymphocytes Relative: 36 %
Lymphs Abs: 3672 cells/uL (ref 850–3900)
MCH: 30.3 pg (ref 27.0–33.0)
MCHC: 33.7 g/dL (ref 32.0–36.0)
MCV: 89.8 fL (ref 80.0–100.0)
MPV: 9.8 fL (ref 7.5–12.5)
Monocytes Absolute: 816 cells/uL (ref 200–950)
Monocytes Relative: 8 %
Neutro Abs: 5508 cells/uL (ref 1500–7800)
Neutrophils Relative %: 54 %
Platelets: 412 10*3/uL — ABNORMAL HIGH (ref 140–400)
RBC: 4.23 MIL/uL (ref 3.80–5.10)
RDW: 12.8 % (ref 11.0–15.0)
WBC: 10.2 10*3/uL (ref 3.8–10.8)

## 2016-12-17 MED ORDER — ESCITALOPRAM OXALATE 10 MG PO TABS: 10.0000 mg | ORAL_TABLET | Freq: Every day | ORAL | 5 refills | Status: DC

## 2016-12-17 NOTE — Progress Notes (Signed)
Subjective:    Patient ID: Nicole [name], female    DOB: 03-10-1981, 36 y.o.   MRN: [id number]  HPI Patient is here today complaining of depression. She states that she's had problems like this off and on her entire life. Her first major problem began in college. She noticed OCD tendencies the year her grandfather died. She would have to check the lock on her dorm room 5 or 6 times before she could sleep. She would even then sometimes wake up and recheck the lock. There are several other little nervous tics that she would perform however over time these behavior subsided. She also suffered from postpartum depression after the birth of her son however she never took medication for it. Over the last few months, the patient reports worsening depression. She reports anhedonia. She reports fatigue. She reports poor concentration. She denies suicidal thoughts. She denies any hallucinations or delusions. However she has very little desire to do anything. It is hard to motivate herself to perform housework. Her thyroid was checked in January and was normal Past Medical History:  Diagnosis Date  . GERD (gastroesophageal reflux disease)   . Hypothyroidism    Past Surgical History:  Procedure Laterality Date  . MOUTH SURGERY     Current Outpatient Prescriptions on File Prior to Visit  Medication Sig Dispense Refill  . esomeprazole (NEXIUM) 20 MG packet Take 20 mg by mouth daily.    . fexofenadine (ALLEGRA) 180 MG tablet Take 180 mg by mouth daily.    Marland Kitchen levothyroxine (SYNTHROID, LEVOTHROID) 125 MCG tablet take 1/2 tablet by mouth every morning with food 30 tablet 6  . Multiple Vitamin (MULTIVITAMIN) tablet Take 1 tablet by mouth daily.     No current facility-administered medications on file prior to visit.    No Known Allergies Social History   Social History  . Marital status: Married    Spouse name: N/A  . Number of children: N/A  . Years of education: N/A   Occupational History  . Not on  file.   Social History Main Topics  . Smoking status: Never Smoker  . Smokeless tobacco: Never Used  . Alcohol use No  . Drug use: No  . Sexual activity: Yes    Birth control/ protection: Surgical     Comment: vasectomy   Other Topics Concern  . Not on file   Social History Narrative  . No narrative on file   Family History  Problem Relation Age of Onset  . Hypertension Father   . Hypertension Maternal Grandmother   . Depression Maternal Grandmother   . Hypertension Paternal Grandmother   . Anxiety disorder Paternal Grandmother   . Macular degeneration Paternal Grandmother   . Diabetes Paternal Grandfather   . Depression Maternal Grandfather   . GER disease Sister    Depression runs in her family and her maternal grandfather committed suicide   Review of Systems  All other systems reviewed and are negative.      Objective:   Physical Exam  Constitutional: She appears well-developed and well-nourished.  Cardiovascular: Normal rate, regular rhythm and normal heart sounds.   No murmur heard. Pulmonary/Chest: Effort normal and breath sounds normal. No respiratory distress. She has no wheezes. She has no rales.  Psychiatric: She has a normal mood and affect. Her behavior is normal. Judgment and thought content normal.  Vitals reviewed.         Assessment & Plan:  Fatigue, unspecified type - Plan: CBC with Differential/Platelet, COMPLETE  METABOLIC PANEL WITH GFR, Vitamin B12  Depression, major, single episode, moderate (HCC)  Check CBC, CMP, vitamin B12. Start the patient on Lexapro 10 mg by mouth daily and recheck the patient in one month or sooner if worse

## 2016-12-18 LAB — VITAMIN B12: VITAMIN B 12: 707 pg/mL (ref 200–1100)

## 2016-12-18 LAB — COMPLETE METABOLIC PANEL WITH GFR
ALT: 16 U/L (ref 6–29)
AST: 18 U/L (ref 10–30)
Albumin: 4.2 g/dL (ref 3.6–5.1)
Alkaline Phosphatase: 57 U/L (ref 33–115)
BUN: 9 mg/dL (ref 7–25)
CHLORIDE: 102 mmol/L (ref 98–110)
CO2: 27 mmol/L (ref 20–31)
Calcium: 9.5 mg/dL (ref 8.6–10.2)
Creat: 0.64 mg/dL (ref 0.50–1.10)
Glucose, Bld: 92 mg/dL (ref 70–99)
Potassium: 4.2 mmol/L (ref 3.5–5.3)
SODIUM: 138 mmol/L (ref 135–146)
Total Bilirubin: 0.3 mg/dL (ref 0.2–1.2)
Total Protein: 7.2 g/dL (ref 6.1–8.1)

## 2017-02-08 ENCOUNTER — Encounter: Payer: Self-pay | Admitting: Family Medicine

## 2017-02-28 ENCOUNTER — Encounter: Payer: Self-pay | Admitting: Family Medicine

## 2017-03-03 ENCOUNTER — Other Ambulatory Visit: Payer: Self-pay | Admitting: Family Medicine

## 2017-03-03 MED ORDER — BUPROPION HCL ER (XL) 150 MG PO TB24: 150.0000 mg | ORAL_TABLET | Freq: Every day | ORAL | 3 refills | Status: DC

## 2017-04-19 ENCOUNTER — Encounter: Payer: Self-pay | Admitting: Family Medicine

## 2017-04-19 ENCOUNTER — Ambulatory Visit (INDEPENDENT_AMBULATORY_CARE_PROVIDER_SITE_OTHER): Payer: BLUE CROSS/BLUE SHIELD | Admitting: Family Medicine

## 2017-04-19 VITALS — BP 122/84 | HR 98 | Temp 98.8°F | Resp 14 | Ht 64.0 in | Wt 208.0 lb

## 2017-04-19 DIAGNOSIS — F321 Major depressive disorder, single episode, moderate: Secondary | ICD-10-CM

## 2017-04-19 NOTE — Progress Notes (Signed)
Subjective:    Patient ID: Nicole JunglingSarah [name], female    DOB: May 04, 1981, 36 y.o.   MRN: [id number]  HPI 12/17/16 Patient is here today complaining of depression. She states that she's had problems like this off and on her entire life. Her first major problem began in college. She noticed OCD tendencies the year her grandfather died. She would have to check the lock on her dorm room 5 or 6 times before she could sleep. She would even then sometimes wake up and recheck the lock. There are several other little nervous tics that she would perform however over time these behavior subsided. She also suffered from postpartum depression after the birth of her son however she never took medication for it. Over the last few months, the patient reports worsening depression. She reports anhedonia. She reports fatigue. She reports poor concentration. She denies suicidal thoughts. She denies any hallucinations or delusions. However she has very little desire to do anything. It is hard to motivate herself to perform housework. Her thyroid was checked in January and was normal.  AT that time, my plan was:  Check CBC, CMP, vitamin B12. Start the patient on Lexapro 10 mg by mouth daily and recheck the patient in one month or sooner if worse  04/19/17 Her labs were completely normal. Patient saw substantial benefit from Lexapro for the first month. However she also experienced decreased libido and sexual side effects that made the medication unacceptable to her. She was then switched to Wellbutrin which seemed to help the depression however she continues to struggle with low motivation, fatigue, poor energy. However she is very concerned about trying a different SSRI due to the sexual side effects she experienced from Lexapro. However she is also not satisfied with the results of Wellbutrin alone although she is doing much better than she was last April Past Medical History:  Diagnosis Date  . GERD (gastroesophageal reflux  disease)   . Hypothyroidism    Past Surgical History:  Procedure Laterality Date  . MOUTH SURGERY     Current Outpatient Prescriptions on File Prior to Visit  Medication Sig Dispense Refill  . buPROPion (WELLBUTRIN XL) 150 MG 24 hr tablet Take 1 tablet (150 mg total) by mouth daily. 30 tablet 3  . esomeprazole (NEXIUM) 20 MG packet Take 20 mg by mouth daily.    . fexofenadine (ALLEGRA) 180 MG tablet Take 180 mg by mouth daily.    Marland Kitchen. levothyroxine (SYNTHROID, LEVOTHROID) 125 MCG tablet take 1/2 tablet by mouth every morning with food 30 tablet 6  . Multiple Vitamin (MULTIVITAMIN) tablet Take 1 tablet by mouth daily.     No current facility-administered medications on file prior to visit.    No Known Allergies Social History   Social History  . Marital status: Married    Spouse name: N/A  . Number of children: N/A  . Years of education: N/A   Occupational History  . Not on file.   Social History Main Topics  . Smoking status: Never Smoker  . Smokeless tobacco: Never Used  . Alcohol use No  . Drug use: No  . Sexual activity: Yes    Birth control/ protection: Surgical     Comment: vasectomy   Other Topics Concern  . Not on file   Social History Narrative  . No narrative on file   Family History  Problem Relation Age of Onset  . Hypertension Father   . Hypertension Maternal Grandmother   . Depression Maternal Grandmother   .  Hypertension Paternal Grandmother   . Anxiety disorder Paternal Grandmother   . Macular degeneration Paternal Grandmother   . Diabetes Paternal Grandfather   . Depression Maternal Grandfather   . GER disease Sister    Depression runs in her family and her maternal grandfather committed suicide   Review of Systems  Psychiatric/Behavioral: Positive for depression.  All other systems reviewed and are negative.      Objective:   Physical Exam  Constitutional: She appears well-developed and well-nourished.  Cardiovascular: Normal rate,  regular rhythm and normal heart sounds.   No murmur heard. Pulmonary/Chest: Effort normal and breath sounds normal. No respiratory distress. She has no wheezes. She has no rales.  Psychiatric: She has a normal mood and affect. Her behavior is normal. Judgment and thought content normal.  Vitals reviewed.         Assessment & Plan:  Depression, major, single episode, moderate (HCC) I gave the patient the option between Effexor and focusing on increasing norepinephrine which may improve energy and motivation or trying trintellix.  She prefers to try trintellix due to the less risk of sexual side effects.  Discontinue Wellbutrin and start 5 mg a day and then increase to 10 mg a day in one week. Reassess in one month.

## 2017-05-12 ENCOUNTER — Encounter: Payer: Self-pay | Admitting: Family Medicine

## 2017-05-12 MED ORDER — VORTIOXETINE HBR 10 MG PO TABS: 10.0000 mg | ORAL_TABLET | Freq: Every day | ORAL | 5 refills | Status: DC

## 2017-06-20 ENCOUNTER — Encounter: Payer: Self-pay | Admitting: Family Medicine

## 2017-06-20 MED ORDER — VORTIOXETINE HBR 20 MG PO TABS: 20.0000 mg | ORAL_TABLET | Freq: Every day | ORAL | 1 refills | Status: DC

## 2017-07-03 ENCOUNTER — Encounter: Payer: Self-pay | Admitting: Family Medicine

## 2017-07-03 DIAGNOSIS — F321 Major depressive disorder, single episode, moderate: Secondary | ICD-10-CM

## 2017-07-14 DIAGNOSIS — Z23 Encounter for immunization: Secondary | ICD-10-CM | POA: Diagnosis not present

## 2017-07-21 ENCOUNTER — Telehealth: Payer: Self-pay | Admitting: Family Medicine

## 2017-07-21 NOTE — Telephone Encounter (Signed)
Pt called and needs refill on levothyroxine and wanted to know if she could go down on the dose or would she need to have labs drawn before we could decrease it. She is taking 125mg  1/2 qd

## 2017-07-22 ENCOUNTER — Other Ambulatory Visit: Payer: Self-pay | Admitting: Family Medicine

## 2017-07-22 DIAGNOSIS — E039 Hypothyroidism, unspecified: Secondary | ICD-10-CM

## 2017-07-22 MED ORDER — LEVOTHYROXINE SODIUM 125 MCG PO TABS: ORAL_TABLET | ORAL | 6 refills | Status: DC

## 2017-07-22 NOTE — Telephone Encounter (Signed)
Needs labs

## 2017-07-22 NOTE — Telephone Encounter (Signed)
Patient aware of providers recommendations and will come in before this RX runs out - current dose sent to pharm

## 2017-07-29 DIAGNOSIS — F329 Major depressive disorder, single episode, unspecified: Secondary | ICD-10-CM | POA: Diagnosis not present

## 2017-08-03 DIAGNOSIS — F329 Major depressive disorder, single episode, unspecified: Secondary | ICD-10-CM | POA: Diagnosis not present

## 2017-08-10 DIAGNOSIS — F329 Major depressive disorder, single episode, unspecified: Secondary | ICD-10-CM | POA: Diagnosis not present

## 2017-08-15 DIAGNOSIS — F329 Major depressive disorder, single episode, unspecified: Secondary | ICD-10-CM | POA: Diagnosis not present

## 2017-08-29 DIAGNOSIS — F329 Major depressive disorder, single episode, unspecified: Secondary | ICD-10-CM | POA: Diagnosis not present

## 2017-09-14 ENCOUNTER — Encounter: Payer: Self-pay | Admitting: Family Medicine

## 2017-09-19 DIAGNOSIS — F329 Major depressive disorder, single episode, unspecified: Secondary | ICD-10-CM | POA: Diagnosis not present

## 2017-09-30 ENCOUNTER — Encounter: Payer: Self-pay | Admitting: Family Medicine

## 2017-10-04 ENCOUNTER — Other Ambulatory Visit: Payer: Self-pay | Admitting: Family Medicine

## 2017-10-04 ENCOUNTER — Other Ambulatory Visit: Payer: BLUE CROSS/BLUE SHIELD

## 2017-10-04 DIAGNOSIS — E039 Hypothyroidism, unspecified: Secondary | ICD-10-CM | POA: Diagnosis not present

## 2017-10-06 ENCOUNTER — Encounter: Payer: Self-pay | Admitting: Family Medicine

## 2017-10-06 ENCOUNTER — Other Ambulatory Visit: Payer: Self-pay | Admitting: Family Medicine

## 2017-10-06 LAB — TEST AUTHORIZATION

## 2017-10-06 LAB — T4, FREE: Free T4: 1.3 ng/dL (ref 0.8–1.8)

## 2017-10-06 LAB — TSH: TSH: 1.31 mIU/L

## 2017-10-06 LAB — T3, FREE: T3, Free: 3.1 pg/mL (ref 2.3–4.2)

## 2017-10-06 MED ORDER — LEVOTHYROXINE SODIUM 50 MCG PO TABS
50.0000 ug | ORAL_TABLET | Freq: Every day | ORAL | 3 refills | Status: DC
Start: 2017-10-06 — End: 2018-09-25

## 2017-10-07 ENCOUNTER — Encounter: Payer: Self-pay | Admitting: Family Medicine

## 2017-10-10 DIAGNOSIS — F329 Major depressive disorder, single episode, unspecified: Secondary | ICD-10-CM | POA: Diagnosis not present

## 2017-10-13 ENCOUNTER — Ambulatory Visit: Payer: BLUE CROSS/BLUE SHIELD | Admitting: Physician Assistant

## 2017-10-13 ENCOUNTER — Encounter: Payer: Self-pay | Admitting: Physician Assistant

## 2017-10-13 ENCOUNTER — Other Ambulatory Visit: Payer: Self-pay

## 2017-10-13 VITALS — BP 124/80 | HR 120 | Temp 99.4°F | Resp 18 | Wt 216.4 lb

## 2017-10-13 DIAGNOSIS — R52 Pain, unspecified: Secondary | ICD-10-CM

## 2017-10-13 DIAGNOSIS — J020 Streptococcal pharyngitis: Secondary | ICD-10-CM

## 2017-10-13 DIAGNOSIS — J029 Acute pharyngitis, unspecified: Secondary | ICD-10-CM

## 2017-10-13 LAB — INFLUENZA A AND B AG, IMMUNOASSAY
INFLUENZA A ANTIGEN: NOT DETECTED
INFLUENZA B ANTIGEN: NOT DETECTED

## 2017-10-13 LAB — STREP GROUP A AG, W/REFLEX TO CULT: Streptococcus, Group A Screen (Direct): DETECTED

## 2017-10-13 MED ORDER — FLUCONAZOLE 150 MG PO TABS: 150.0000 mg | ORAL_TABLET | Freq: Once | ORAL | 0 refills | Status: AC

## 2017-10-13 MED ORDER — AMOXICILLIN 500 MG PO CAPS: 500.0000 mg | ORAL_CAPSULE | Freq: Three times a day (TID) | ORAL | 0 refills | Status: DC

## 2017-10-13 NOTE — Progress Notes (Signed)
    Patient ID: Nicole [name] MRN: [id number], DOB: 1981/07/03, 37 y.o. Date of Encounter: 10/13/2017, 12:45 PM    Chief Complaint:  Chief Complaint  Patient presents with  . Sore Throat    x1day  . sinus drainage  . Fever    99.4     HPI: 37 y.o. year old female presents with above.   She reports that 2 of her children have tested positive for strep 5 days ago.  Reports that her symptoms started last night during the night.  She developed sore throat and not feeling good.  Took Tylenol this morning.  Still,  reading temperature 99.4 even after the Tylenol.     Home Meds:   Outpatient Medications Prior to Visit  Medication Sig Dispense Refill  . escitalopram (LEXAPRO) 5 MG tablet Take 5 mg by mouth daily.    . fexofenadine (ALLEGRA) 180 MG tablet Take 180 mg by mouth daily.    Marland Kitchen. levothyroxine (SYNTHROID, LEVOTHROID) 50 MCG tablet Take 1 tablet (50 mcg total) by mouth daily. 90 tablet 3  . Multiple Vitamin (MULTIVITAMIN) tablet Take 1 tablet by mouth daily.    Marland Kitchen. esomeprazole (NEXIUM) 20 MG packet Take 20 mg by mouth daily.    Marland Kitchen. vortioxetine HBr (TRINTELLIX) 20 MG TABS Take 20 mg by mouth daily. (Patient not taking: Reported on 10/13/2017) 30 tablet 1   No facility-administered medications prior to visit.     Allergies: No Known Allergies    Review of Systems: See HPI for pertinent ROS. All other ROS negative.    Physical Exam: Blood pressure 124/80, pulse (!) 102, temperature 99.4 F (37.4 C), temperature source Oral, resp. rate 18, weight 98.2 kg (216 lb 6.4 oz), last menstrual period 09/30/2017, SpO2 98 %., Body mass index is 37.14 kg/m. General:  WF. Appears in no acute distress. HEENT: Normocephalic, atraumatic, eyes without discharge, sclera non-icteric, nares are without discharge. Bilateral auditory canals clear, TM's are without perforation, pearly grey and translucent with reflective cone of light bilaterally. Oral cavity moist, posterior pharynx with mild  erythema. No exudate. No peritonsillar abscess.  Neck: Supple. No thyromegaly. No lymphadenopathy. Lungs: Clear bilaterally to auscultation without wheezes, rales, or rhonchi. Breathing is unlabored. Heart: Regular rhythm. No murmurs, rubs, or gallops. Msk:  Strength and tone normal for age. Extremities/Skin: Warm and dry. Neuro: Alert and oriented X 3. Moves all extremities spontaneously. Gait is normal. CNII-XII grossly in tact. Psych:  Responds to questions appropriately with a normal affect.     ASSESSMENT AND PLAN:  37 y.o. year old female with   1. Strep pharyngitis Strep test is positive.  She is to take the amoxicillin as directed and complete all 10 days of treatment. - amoxicillin (AMOXIL) 500 MG capsule; Take 1 capsule (500 mg total) by mouth 3 (three) times daily.  Dispense: 30 capsule; Refill: 0  2. Sore throat - STREP GROUP A AG, W/REFLEX TO CULT - amoxicillin (AMOXIL) 500 MG capsule; Take 1 capsule (500 mg total) by mouth 3 (three) times daily.  Dispense: 30 capsule; Refill: 0  3. Generalized body aches - Influenza A and B Ag, Immunoassay   Signed, [address]Mary Beth AthensDixon, [place], [facility] 10/13/2017 12:45 PM

## 2017-10-17 DIAGNOSIS — F411 Generalized anxiety disorder: Secondary | ICD-10-CM | POA: Diagnosis not present

## 2017-10-17 DIAGNOSIS — F331 Major depressive disorder, recurrent, moderate: Secondary | ICD-10-CM | POA: Diagnosis not present

## 2017-10-24 DIAGNOSIS — F332 Major depressive disorder, recurrent severe without psychotic features: Secondary | ICD-10-CM | POA: Diagnosis not present

## 2017-10-24 DIAGNOSIS — F424 Excoriation (skin-picking) disorder: Secondary | ICD-10-CM | POA: Diagnosis not present

## 2017-10-24 DIAGNOSIS — F422 Mixed obsessional thoughts and acts: Secondary | ICD-10-CM | POA: Diagnosis not present

## 2017-10-24 DIAGNOSIS — F329 Major depressive disorder, single episode, unspecified: Secondary | ICD-10-CM | POA: Diagnosis not present

## 2017-10-25 ENCOUNTER — Encounter: Payer: Self-pay | Admitting: Physician Assistant

## 2017-10-26 MED ORDER — FLUCONAZOLE 150 MG PO TABS: 150.0000 mg | ORAL_TABLET | Freq: Once | ORAL | 0 refills | Status: AC

## 2017-11-07 DIAGNOSIS — F332 Major depressive disorder, recurrent severe without psychotic features: Secondary | ICD-10-CM | POA: Diagnosis not present

## 2017-11-07 DIAGNOSIS — F424 Excoriation (skin-picking) disorder: Secondary | ICD-10-CM | POA: Diagnosis not present

## 2017-11-07 DIAGNOSIS — F329 Major depressive disorder, single episode, unspecified: Secondary | ICD-10-CM | POA: Diagnosis not present

## 2017-11-07 DIAGNOSIS — F422 Mixed obsessional thoughts and acts: Secondary | ICD-10-CM | POA: Diagnosis not present

## 2017-11-14 DIAGNOSIS — F329 Major depressive disorder, single episode, unspecified: Secondary | ICD-10-CM | POA: Diagnosis not present

## 2017-11-28 DIAGNOSIS — F329 Major depressive disorder, single episode, unspecified: Secondary | ICD-10-CM | POA: Diagnosis not present

## 2017-11-29 DIAGNOSIS — F422 Mixed obsessional thoughts and acts: Secondary | ICD-10-CM | POA: Diagnosis not present

## 2017-11-29 DIAGNOSIS — F332 Major depressive disorder, recurrent severe without psychotic features: Secondary | ICD-10-CM | POA: Diagnosis not present

## 2017-11-29 DIAGNOSIS — F424 Excoriation (skin-picking) disorder: Secondary | ICD-10-CM | POA: Diagnosis not present

## 2017-11-29 DIAGNOSIS — F411 Generalized anxiety disorder: Secondary | ICD-10-CM | POA: Diagnosis not present

## 2017-12-01 DIAGNOSIS — R5383 Other fatigue: Secondary | ICD-10-CM | POA: Diagnosis not present

## 2017-12-01 DIAGNOSIS — F411 Generalized anxiety disorder: Secondary | ICD-10-CM | POA: Diagnosis not present

## 2017-12-05 DIAGNOSIS — F329 Major depressive disorder, single episode, unspecified: Secondary | ICD-10-CM | POA: Diagnosis not present

## 2017-12-19 DIAGNOSIS — F329 Major depressive disorder, single episode, unspecified: Secondary | ICD-10-CM | POA: Diagnosis not present

## 2017-12-29 DIAGNOSIS — F422 Mixed obsessional thoughts and acts: Secondary | ICD-10-CM | POA: Diagnosis not present

## 2017-12-29 DIAGNOSIS — F424 Excoriation (skin-picking) disorder: Secondary | ICD-10-CM | POA: Diagnosis not present

## 2017-12-29 DIAGNOSIS — F332 Major depressive disorder, recurrent severe without psychotic features: Secondary | ICD-10-CM | POA: Diagnosis not present

## 2017-12-29 DIAGNOSIS — F411 Generalized anxiety disorder: Secondary | ICD-10-CM | POA: Diagnosis not present

## 2018-01-12 ENCOUNTER — Encounter: Payer: Self-pay | Admitting: Family Medicine

## 2018-01-12 DIAGNOSIS — F411 Generalized anxiety disorder: Secondary | ICD-10-CM | POA: Diagnosis not present

## 2018-01-12 DIAGNOSIS — F424 Excoriation (skin-picking) disorder: Secondary | ICD-10-CM | POA: Diagnosis not present

## 2018-01-12 DIAGNOSIS — F332 Major depressive disorder, recurrent severe without psychotic features: Secondary | ICD-10-CM | POA: Diagnosis not present

## 2018-01-12 DIAGNOSIS — F422 Mixed obsessional thoughts and acts: Secondary | ICD-10-CM | POA: Diagnosis not present

## 2018-01-30 DIAGNOSIS — F329 Major depressive disorder, single episode, unspecified: Secondary | ICD-10-CM | POA: Diagnosis not present

## 2018-02-09 DIAGNOSIS — F332 Major depressive disorder, recurrent severe without psychotic features: Secondary | ICD-10-CM | POA: Diagnosis not present

## 2018-02-09 DIAGNOSIS — F424 Excoriation (skin-picking) disorder: Secondary | ICD-10-CM | POA: Diagnosis not present

## 2018-02-09 DIAGNOSIS — F411 Generalized anxiety disorder: Secondary | ICD-10-CM | POA: Diagnosis not present

## 2018-02-09 DIAGNOSIS — F422 Mixed obsessional thoughts and acts: Secondary | ICD-10-CM | POA: Diagnosis not present

## 2018-03-06 DIAGNOSIS — F329 Major depressive disorder, single episode, unspecified: Secondary | ICD-10-CM | POA: Diagnosis not present

## 2018-03-09 DIAGNOSIS — F424 Excoriation (skin-picking) disorder: Secondary | ICD-10-CM | POA: Diagnosis not present

## 2018-03-09 DIAGNOSIS — F422 Mixed obsessional thoughts and acts: Secondary | ICD-10-CM | POA: Diagnosis not present

## 2018-03-09 DIAGNOSIS — F332 Major depressive disorder, recurrent severe without psychotic features: Secondary | ICD-10-CM | POA: Diagnosis not present

## 2018-03-09 DIAGNOSIS — F411 Generalized anxiety disorder: Secondary | ICD-10-CM | POA: Diagnosis not present

## 2018-03-30 DIAGNOSIS — F329 Major depressive disorder, single episode, unspecified: Secondary | ICD-10-CM | POA: Diagnosis not present

## 2018-04-06 DIAGNOSIS — F424 Excoriation (skin-picking) disorder: Secondary | ICD-10-CM | POA: Diagnosis not present

## 2018-04-06 DIAGNOSIS — F332 Major depressive disorder, recurrent severe without psychotic features: Secondary | ICD-10-CM | POA: Diagnosis not present

## 2018-04-06 DIAGNOSIS — F411 Generalized anxiety disorder: Secondary | ICD-10-CM | POA: Diagnosis not present

## 2018-04-06 DIAGNOSIS — F422 Mixed obsessional thoughts and acts: Secondary | ICD-10-CM | POA: Diagnosis not present

## 2018-04-17 DIAGNOSIS — F329 Major depressive disorder, single episode, unspecified: Secondary | ICD-10-CM | POA: Diagnosis not present

## 2018-05-01 DIAGNOSIS — F329 Major depressive disorder, single episode, unspecified: Secondary | ICD-10-CM | POA: Diagnosis not present

## 2018-05-16 DIAGNOSIS — F329 Major depressive disorder, single episode, unspecified: Secondary | ICD-10-CM | POA: Diagnosis not present

## 2018-06-06 DIAGNOSIS — F422 Mixed obsessional thoughts and acts: Secondary | ICD-10-CM | POA: Diagnosis not present

## 2018-06-06 DIAGNOSIS — F424 Excoriation (skin-picking) disorder: Secondary | ICD-10-CM | POA: Diagnosis not present

## 2018-06-06 DIAGNOSIS — F411 Generalized anxiety disorder: Secondary | ICD-10-CM | POA: Diagnosis not present

## 2018-06-06 DIAGNOSIS — F331 Major depressive disorder, recurrent, moderate: Secondary | ICD-10-CM | POA: Diagnosis not present

## 2018-06-23 DIAGNOSIS — Z23 Encounter for immunization: Secondary | ICD-10-CM | POA: Diagnosis not present

## 2018-06-26 DIAGNOSIS — F422 Mixed obsessional thoughts and acts: Secondary | ICD-10-CM | POA: Diagnosis not present

## 2018-06-26 DIAGNOSIS — F331 Major depressive disorder, recurrent, moderate: Secondary | ICD-10-CM | POA: Diagnosis not present

## 2018-06-26 DIAGNOSIS — F411 Generalized anxiety disorder: Secondary | ICD-10-CM | POA: Diagnosis not present

## 2018-06-26 DIAGNOSIS — F424 Excoriation (skin-picking) disorder: Secondary | ICD-10-CM | POA: Diagnosis not present

## 2018-08-08 DIAGNOSIS — D225 Melanocytic nevi of trunk: Secondary | ICD-10-CM | POA: Diagnosis not present

## 2018-08-08 DIAGNOSIS — L821 Other seborrheic keratosis: Secondary | ICD-10-CM | POA: Diagnosis not present

## 2018-08-08 DIAGNOSIS — L814 Other melanin hyperpigmentation: Secondary | ICD-10-CM | POA: Diagnosis not present

## 2018-08-21 ENCOUNTER — Encounter: Payer: Self-pay | Admitting: Adult Health

## 2018-08-21 ENCOUNTER — Ambulatory Visit: Payer: BLUE CROSS/BLUE SHIELD | Admitting: Adult Health

## 2018-08-21 ENCOUNTER — Other Ambulatory Visit: Payer: Self-pay

## 2018-08-21 VITALS — BP 119/67 | HR 96 | Ht 64.0 in | Wt 217.0 lb

## 2018-08-21 DIAGNOSIS — F333 Major depressive disorder, recurrent, severe with psychotic symptoms: Secondary | ICD-10-CM | POA: Diagnosis not present

## 2018-08-21 DIAGNOSIS — F329 Major depressive disorder, single episode, unspecified: Secondary | ICD-10-CM | POA: Diagnosis not present

## 2018-08-21 DIAGNOSIS — F32A Depression, unspecified: Secondary | ICD-10-CM

## 2018-08-21 DIAGNOSIS — Z13220 Encounter for screening for lipoid disorders: Secondary | ICD-10-CM

## 2018-08-21 DIAGNOSIS — R5383 Other fatigue: Secondary | ICD-10-CM | POA: Diagnosis not present

## 2018-08-21 DIAGNOSIS — F424 Excoriation (skin-picking) disorder: Secondary | ICD-10-CM | POA: Diagnosis not present

## 2018-08-21 DIAGNOSIS — R4589 Other symptoms and signs involving emotional state: Secondary | ICD-10-CM | POA: Diagnosis not present

## 2018-08-21 DIAGNOSIS — E039 Hypothyroidism, unspecified: Secondary | ICD-10-CM | POA: Diagnosis not present

## 2018-08-21 DIAGNOSIS — F411 Generalized anxiety disorder: Secondary | ICD-10-CM | POA: Diagnosis not present

## 2018-08-21 DIAGNOSIS — F422 Mixed obsessional thoughts and acts: Secondary | ICD-10-CM | POA: Diagnosis not present

## 2018-08-21 NOTE — Progress Notes (Signed)
  Subjective:     Patient ID: Nicole [name], female   DOB: Mar 19, 1981, 37 y.o.   MRN: [id number]  HPI Nicole [name] is a 37 year old white female,married in to discuss hormones.She sees Dr Lamar SprinklesPinkard and [facility] [place].She is complaining of fatigue,moody and increased sweating, periods are regular and husband had vasectomy.She is on synthroid.And she is weaning off wellbutrin and viibryd and has new RXs for modafinil 100 mg daily and clomipramine 25 mg daily to being soon. She has struggled with depression since before college.   Review of Systems +fatigue +moody +sweating Regular periods Reviewed past medical,surgical, social and family history. Reviewed medications and allergies.     Objective:   Physical Exam BP 119/67 (BP Location: Right Arm, Patient Position: Sitting, Cuff Size: Normal)   Pulse 96   Ht 5\' 4"  (1.626 m)   Wt 217 lb (98.4 kg)   LMP 07/25/2018   BMI 37.25 kg/m   Skin warm and dry. Lungs: clear to ausculation bilaterally. Cardiovascular: regular rate and rhythm. Fall risk is low PHQ 9 score is 12 is on meds and [facility], denies being suicidal.  Will check labs,[facility].    Assessment:     1. Fatigue, unspecified type   2. Moody   3. Hypothyroidism, unspecified type   4. Depression, unspecified depression type   5. Screening cholesterol level       Plan:     Check CBC,CMP,TSH,Free T4 and T3 and lipids,vitamin D,magnesium, estraiol and LH   Will talk when labs back,could try low dose OC

## 2018-08-25 DIAGNOSIS — R5383 Other fatigue: Secondary | ICD-10-CM | POA: Diagnosis not present

## 2018-08-25 DIAGNOSIS — R4589 Other symptoms and signs involving emotional state: Secondary | ICD-10-CM | POA: Diagnosis not present

## 2018-08-25 DIAGNOSIS — Z13220 Encounter for screening for lipoid disorders: Secondary | ICD-10-CM | POA: Diagnosis not present

## 2018-08-25 DIAGNOSIS — E039 Hypothyroidism, unspecified: Secondary | ICD-10-CM | POA: Diagnosis not present

## 2018-08-27 LAB — LIPID PANEL
CHOLESTEROL TOTAL: 163 mg/dL (ref 100–199)
Chol/HDL Ratio: 3.8 ratio (ref 0.0–4.4)
HDL: 43 mg/dL (ref >39)
LDL Calculated: 87 mg/dL (ref 0–99)
TRIGLYCERIDES: 164 mg/dL — AB (ref 0–149)
VLDL Cholesterol Cal: 33 mg/dL (ref 5–40)

## 2018-08-27 LAB — CBC
HEMATOCRIT: 38 % (ref 34.0–46.6)
HEMOGLOBIN: 12.3 g/dL (ref 11.1–15.9)
MCH: 28.9 pg (ref 26.6–33.0)
MCHC: 32.4 g/dL (ref 31.5–35.7)
MCV: 89 fL (ref 79–97)
Platelets: 346 10*3/uL (ref 150–450)
RBC: 4.25 x10E6/uL (ref 3.77–5.28)
RDW: 12 % — ABNORMAL LOW (ref 12.3–15.4)
WBC: 10.4 10*3/uL (ref 3.4–10.8)

## 2018-08-27 LAB — COMPREHENSIVE METABOLIC PANEL
ALT: 17 IU/L (ref 0–32)
AST: 10 IU/L (ref 0–40)
Albumin/Globulin Ratio: 1.7 (ref 1.2–2.2)
Albumin: 4.1 g/dL (ref 3.5–5.5)
Alkaline Phosphatase: 67 IU/L (ref 39–117)
BUN/Creatinine Ratio: 16 (ref 9–23)
BUN: 10 mg/dL (ref 6–20)
Bilirubin Total: 0.3 mg/dL (ref 0.0–1.2)
CALCIUM: 9.2 mg/dL (ref 8.7–10.2)
CO2: 24 mmol/L (ref 20–29)
CREATININE: 0.61 mg/dL (ref 0.57–1.00)
Chloride: 99 mmol/L (ref 96–106)
GFR calc Af Amer: 133 mL/min/{1.73_m2} (ref >59)
GFR, EST NON AFRICAN AMERICAN: 115 mL/min/{1.73_m2} (ref >59)
GLOBULIN, TOTAL: 2.4 g/dL (ref 1.5–4.5)
Glucose: 133 mg/dL — ABNORMAL HIGH (ref 65–99)
Potassium: 4.1 mmol/L (ref 3.5–5.2)
SODIUM: 136 mmol/L (ref 134–144)
Total Protein: 6.5 g/dL (ref 6.0–8.5)

## 2018-08-27 LAB — MAGNESIUM: Magnesium: 1.9 mg/dL (ref 1.6–2.3)

## 2018-08-27 LAB — T4, FREE: FREE T4: 1.11 ng/dL (ref 0.82–1.77)

## 2018-08-27 LAB — LUTEINIZING HORMONE: LH: 6.9 m[IU]/mL

## 2018-08-27 LAB — T3: T3 TOTAL: 111 ng/dL (ref 71–180)

## 2018-08-27 LAB — TSH: TSH: 1.64 u[IU]/mL (ref 0.450–4.500)

## 2018-08-27 LAB — VITAMIN D 25 HYDROXY (VIT D DEFICIENCY, FRACTURES): Vit D, 25-Hydroxy: 25.4 ng/mL — ABNORMAL LOW (ref 30.0–100.0)

## 2018-08-27 LAB — ESTRIOL: Estriol: <0.1 ng/mL

## 2018-09-19 DIAGNOSIS — F422 Mixed obsessional thoughts and acts: Secondary | ICD-10-CM | POA: Diagnosis not present

## 2018-09-19 DIAGNOSIS — F411 Generalized anxiety disorder: Secondary | ICD-10-CM | POA: Diagnosis not present

## 2018-09-19 DIAGNOSIS — F424 Excoriation (skin-picking) disorder: Secondary | ICD-10-CM | POA: Diagnosis not present

## 2018-09-19 DIAGNOSIS — F333 Major depressive disorder, recurrent, severe with psychotic symptoms: Secondary | ICD-10-CM | POA: Diagnosis not present

## 2018-09-21 ENCOUNTER — Ambulatory Visit (INDEPENDENT_AMBULATORY_CARE_PROVIDER_SITE_OTHER): Payer: BLUE CROSS/BLUE SHIELD | Admitting: Adult Health

## 2018-09-21 ENCOUNTER — Encounter: Payer: Self-pay | Admitting: Adult Health

## 2018-09-21 ENCOUNTER — Other Ambulatory Visit: Payer: Self-pay

## 2018-09-21 VITALS — BP 125/72 | HR 95 | Ht 64.0 in | Wt 215.5 lb

## 2018-09-21 DIAGNOSIS — R739 Hyperglycemia, unspecified: Secondary | ICD-10-CM

## 2018-09-21 DIAGNOSIS — Z01419 Encounter for gynecological examination (general) (routine) without abnormal findings: Secondary | ICD-10-CM | POA: Diagnosis not present

## 2018-09-21 NOTE — Progress Notes (Signed)
Patient ID: Nicole [name], female   DOB: June 03, 1981, 38 y.o.   MRN: [id number] History of Present Illness: Nicole [name] is a 38 year old white femalehite female, married, G3P3, in for a well woman gyn exam, she had a normal pap with negative HPV, 09/28/16. PCP is Dr Tanya Nones.    Current Medications, Allergies, Past Medical History, Past Surgical History, Family History and Social History were reviewed in [employer] record.     Review of Systems: Patient denies any headaches, hearing loss,  blurred vision, shortness of breath, chest pain, abdominal pain, problems with bowel movements, urination, or intercourse. No joint pain. Fatigue better, not as moody.    Physical Exam:BP 125/72 (BP Location: Left Arm, Patient Position: Sitting, Cuff Size: Normal)   Pulse 95   Ht 5\' 4"  (1.626 m)   Wt 215 lb 8 oz (97.8 kg)   LMP 08/28/2018   BMI 36.99 kg/m  General:  Well developed, well nourished, no acute distress Skin:  Warm and dry Neck:  Midline trachea, normal thyroid, good ROM, no lymphadenopathy Lungs; Clear to auscultation bilaterally Breast:  No dominant palpable mass, retraction, or nipple discharge Cardiovascular: Regular rate and rhythm Abdomen:  Soft, non tender, no hepatosplenomegaly Pelvic:  External genitalia is normal in appearance, no lesions.  The vagina is normal in appearance. Urethra has no lesions or masses. The cervix is bulbous.  Uterus is felt to be normal size, shape, and contour.  No adnexal masses or tenderness noted.Bladder is non tender, no masses felt Extremities/musculoskeletal:  No swelling or varicosities noted, no clubbing or cyanosis Psych:  No mood changes, alert and cooperative,seems happy PHQ 2 score 0 Fall risk is low. Examination chaperoned by Marchelle Folks Rash LPN Will check A1K since FBS in 130s, and discussed trying low dose OC.   Impression:  1. Encounter for well woman exam with routine gynecological exam   2. Elevated blood sugar       Plan: Check A1c Pap and physical in 1 year Mammogram at 39

## 2018-09-22 LAB — HEMOGLOBIN A1C
ESTIMATED AVERAGE GLUCOSE: 126 mg/dL
Hgb A1c MFr Bld: 6 % — ABNORMAL HIGH (ref 4.8–5.6)

## 2018-09-25 ENCOUNTER — Other Ambulatory Visit: Payer: Self-pay | Admitting: *Deleted

## 2018-09-25 MED ORDER — LEVOTHYROXINE SODIUM 50 MCG PO TABS: 50.0000 ug | ORAL_TABLET | Freq: Every day | ORAL | 3 refills | Status: DC

## 2018-09-26 DIAGNOSIS — F329 Major depressive disorder, single episode, unspecified: Secondary | ICD-10-CM | POA: Diagnosis not present

## 2018-10-03 DIAGNOSIS — F329 Major depressive disorder, single episode, unspecified: Secondary | ICD-10-CM | POA: Diagnosis not present

## 2018-11-02 DIAGNOSIS — F424 Excoriation (skin-picking) disorder: Secondary | ICD-10-CM | POA: Diagnosis not present

## 2018-11-02 DIAGNOSIS — F411 Generalized anxiety disorder: Secondary | ICD-10-CM | POA: Diagnosis not present

## 2018-11-02 DIAGNOSIS — F332 Major depressive disorder, recurrent severe without psychotic features: Secondary | ICD-10-CM | POA: Diagnosis not present

## 2018-11-02 DIAGNOSIS — F422 Mixed obsessional thoughts and acts: Secondary | ICD-10-CM | POA: Diagnosis not present

## 2018-11-07 DIAGNOSIS — F329 Major depressive disorder, single episode, unspecified: Secondary | ICD-10-CM | POA: Diagnosis not present

## 2018-11-09 ENCOUNTER — Other Ambulatory Visit: Payer: Self-pay | Admitting: Adult Health

## 2018-11-09 ENCOUNTER — Telehealth: Payer: Self-pay | Admitting: *Deleted

## 2018-11-09 MED ORDER — TERCONAZOLE 0.4 % VA CREA: 1.0000 | TOPICAL_CREAM | Freq: Every day | VAGINAL | 0 refills | Status: DC

## 2018-11-09 NOTE — Progress Notes (Signed)
rx terazol

## 2018-11-09 NOTE — Telephone Encounter (Signed)
Received call from patient.   Reports that she has some slight numbness and tingling to L hand. Reports that it effects all fingers, hand, wrist and forearm, approximately 0.5" above wrist. State that grasp on L side is decreased.  States that sensations have been present x10 minutes and are not changing.   No discoloration to fingers noted, no changes in skin temperature noted, no injury noted.  ROM remains the same at wrist and above.   Advised if Sx worsen, or pain develops, go to ER for eval.   Appointment scheduled for 11/10/2018.

## 2018-11-09 NOTE — Telephone Encounter (Signed)
Most likely carpal tunnel. NTBS immediately if worsening

## 2018-11-10 ENCOUNTER — Encounter: Payer: Self-pay | Admitting: Family Medicine

## 2018-11-10 ENCOUNTER — Ambulatory Visit (INDEPENDENT_AMBULATORY_CARE_PROVIDER_SITE_OTHER): Payer: BLUE CROSS/BLUE SHIELD | Admitting: Family Medicine

## 2018-11-10 VITALS — BP 120/88 | HR 96 | Temp 98.4°F | Resp 14 | Ht 64.0 in | Wt 207.0 lb

## 2018-11-10 DIAGNOSIS — G5602 Carpal tunnel syndrome, left upper limb: Secondary | ICD-10-CM | POA: Diagnosis not present

## 2018-11-10 MED ORDER — DICLOFENAC SODIUM 75 MG PO TBEC: 75.0000 mg | DELAYED_RELEASE_TABLET | Freq: Two times a day (BID) | ORAL | 0 refills | Status: DC

## 2018-11-10 NOTE — Progress Notes (Signed)
Subjective:    Patient ID: Nicole [name], female    DOB: Jul 22, 1981, 38 y.o.   MRN: [id number]  Yesterday, patient developed numbness and tingling in her left hand.  It was distal to the wrist and isolated to the hand.  She also noticed swelling over the flexor crease.  There is some soft tissue swelling in that area today.  There is also some mild tenderness to palpation in that area.  The patient took some ibuprofen and the numbness and tingling improved.  However today she was exercising using free weights.  Shortly after exercising using free weights, the numbness and tingling again developed in her left hand.  The swelling worsened in her wrist.  Previously she has not had any issues with her hand.  She denies any numbness or weakness in her left arm.  She denies any numbness or weakness in her left leg.  She denies any facial weakness.  She denies any slurred speech.  She denies any other neurologic deficit.  Today Tinel sign immediately reproduces the pain and sensation.  In fact the patient jumps with gentle percussion on the flexor crease.  Also Phalen sign worsens the numbness almost instantaneously.  Right hand is relatively normal. Past Medical History:  Diagnosis Date  . GERD (gastroesophageal reflux disease)   . Hypothyroidism    Past Surgical History:  Procedure Laterality Date  . MOUTH SURGERY     Current Outpatient Medications on File Prior to Visit  Medication Sig Dispense Refill  . cholecalciferol (VITAMIN D3) 25 MCG (1000 UT) tablet Take 1,000 Units by mouth daily.    . clomiPRAMINE (ANAFRANIL) 25 MG capsule Take 25 mg by mouth at bedtime.    Marland Kitchen esomeprazole (NEXIUM) 20 MG packet Take 20 mg by mouth daily.    . fexofenadine (ALLEGRA) 180 MG tablet Take 180 mg by mouth daily.    Marland Kitchen levothyroxine (SYNTHROID, LEVOTHROID) 50 MCG tablet Take 1 tablet (50 mcg total) by mouth daily. 90 tablet 3  . modafinil (PROVIGIL) 100 MG tablet Take 150 mg by mouth daily.    . Multiple Vitamin  (MULTIVITAMIN) tablet Take 1 tablet by mouth daily.    . propranolol (INDERAL) 20 MG tablet TK 1 T PO QD  0  . terconazole (TERAZOL 7) 0.4 % vaginal cream Place 1 applicator vaginally at bedtime. 45 g 0   No current facility-administered medications on file prior to visit.    No Known Allergies Social History   Socioeconomic History  . Marital status: Married    Spouse name: Not on file  . Number of children: Not on file  . Years of education: Not on file  . Highest education level: Not on file  Occupational History  . Not on file  Social Needs  . Financial resource strain: Not on file  . Food insecurity:    Worry: Not on file    Inability: Not on file  . Transportation needs:    Medical: Not on file    Non-medical: Not on file  Tobacco Use  . Smoking status: Never Smoker  . Smokeless tobacco: Never Used  Substance and Sexual Activity  . Alcohol use: No  . Drug use: No  . Sexual activity: Yes    Birth control/protection: Surgical    Comment: vasectomy  Lifestyle  . Physical activity:    Days per week: Not on file    Minutes per session: Not on file  . Stress: Not on file  Relationships  . Social  connections:    Talks on phone: Not on file    Gets together: Not on file    Attends religious service: Not on file    Active member of club or organization: Not on file    Attends meetings of clubs or organizations: Not on file    Relationship status: Not on file  . Intimate partner violence:    Fear of current or ex partner: Not on file    Emotionally abused: Not on file    Physically abused: Not on file    Forced sexual activity: Not on file  Other Topics Concern  . Not on file  Social History Narrative  . Not on file   Family History  Problem Relation Age of Onset  . Hypertension Father   . Hypertension Maternal Grandmother   . Depression Maternal Grandmother   . Hypertension Paternal Grandmother   . Anxiety disorder Paternal Grandmother   . Macular  degeneration Paternal Grandmother   . Diabetes Paternal Grandfather   . Depression Maternal Grandfather   . GER disease Sister      Review of Systems  Psychiatric/Behavioral: Positive for depression.  All other systems reviewed and are negative.      Objective:   Physical Exam  Constitutional: She appears well-developed and well-nourished.  Cardiovascular: Normal rate, regular rhythm and normal heart sounds.  No murmur heard. Pulmonary/Chest: Effort normal and breath sounds normal. No respiratory distress. She has no wheezes. She has no rales.  Psychiatric: She has a normal mood and affect. Her behavior is normal. Judgment and thought content normal.  Vitals reviewed.         Assessment & Plan:  Carpal tunnel syndrome of left wrist - Plan: diclofenac (VOLTAREN) 75 MG EC tablet, DG Wrist Complete Left  Symptoms are consistent with carpal tunnel syndrome.  It is unusual and how acute it began.  I believe the swelling in her left wrist is likely causing pressure on the nerve.  I am not sure why she has the swelling but I believe this represents an underlying injury to the wrist.  Therefore I recommended an x-ray of the wrist.  I recommended wearing a cock-up wrist splint to avoid pressure on the median nerve.  I will treat the swelling with diclofenac 75 mg p.o. twice daily.  Reassess in 1 week or sooner if worse.  If symptoms are not improving, consider a cortisone injectio in the carpal tunnel.  If she develops any other neurologic deficits anywhere else in the body, she is to be seen immediately to rule out other potential causes however her exam suggest carpal tunnel syndrome

## 2018-11-13 ENCOUNTER — Ambulatory Visit (HOSPITAL_COMMUNITY)
Admission: RE | Admit: 2018-11-13 | Discharge: 2018-11-13 | Disposition: A | Discharge status: Home / Self Care | Payer: BLUE CROSS/BLUE SHIELD | Source: Ambulatory Visit | Attending: Family Medicine | Admitting: Family Medicine

## 2018-11-13 ENCOUNTER — Encounter: Payer: Self-pay | Admitting: Family Medicine

## 2018-11-13 DIAGNOSIS — R202 Paresthesia of skin: Secondary | ICD-10-CM | POA: Diagnosis not present

## 2018-11-13 DIAGNOSIS — G5602 Carpal tunnel syndrome, left upper limb: Secondary | ICD-10-CM | POA: Diagnosis not present

## 2018-11-14 ENCOUNTER — Telehealth: Payer: Self-pay | Admitting: Family Medicine

## 2018-11-14 DIAGNOSIS — F329 Major depressive disorder, single episode, unspecified: Secondary | ICD-10-CM | POA: Diagnosis not present

## 2018-11-14 NOTE — Telephone Encounter (Signed)
Pt aware to schedule apt via mychart

## 2018-11-14 NOTE — Telephone Encounter (Signed)
Patient called after-hours line yesterday.  States that she had an episode of tingling in her upper right arm she had in her hand before concern for  possible carpal tunnel.  States that she had a little pain.  It went away fairly quickly.  No numbness or tingling in the face no change in her speech no other stroke symptoms no cardiac symptoms.  Advise her lower her PCP they are working up nerve impingement versus carpal tunnel in the hand.  Advised that she has any of the emergent symptoms above to go to the hospital tonight.  PCP notified this morning.

## 2018-11-14 NOTE — Telephone Encounter (Signed)
I'd like to see her back in 1 week to see how she is doing.  If symptoms persist, will get NCV with [facility]

## 2018-11-21 DIAGNOSIS — F329 Major depressive disorder, single episode, unspecified: Secondary | ICD-10-CM | POA: Diagnosis not present

## 2018-11-27 DIAGNOSIS — F329 Major depressive disorder, single episode, unspecified: Secondary | ICD-10-CM | POA: Diagnosis not present

## 2018-11-30 DIAGNOSIS — F422 Mixed obsessional thoughts and acts: Secondary | ICD-10-CM | POA: Diagnosis not present

## 2018-11-30 DIAGNOSIS — F411 Generalized anxiety disorder: Secondary | ICD-10-CM | POA: Diagnosis not present

## 2018-11-30 DIAGNOSIS — F331 Major depressive disorder, recurrent, moderate: Secondary | ICD-10-CM | POA: Diagnosis not present

## 2018-11-30 DIAGNOSIS — F424 Excoriation (skin-picking) disorder: Secondary | ICD-10-CM | POA: Diagnosis not present

## 2018-12-05 DIAGNOSIS — F329 Major depressive disorder, single episode, unspecified: Secondary | ICD-10-CM | POA: Diagnosis not present

## 2018-12-11 DIAGNOSIS — F329 Major depressive disorder, single episode, unspecified: Secondary | ICD-10-CM | POA: Diagnosis not present

## 2018-12-19 DIAGNOSIS — F329 Major depressive disorder, single episode, unspecified: Secondary | ICD-10-CM | POA: Diagnosis not present

## 2018-12-26 DIAGNOSIS — F329 Major depressive disorder, single episode, unspecified: Secondary | ICD-10-CM | POA: Diagnosis not present

## 2019-01-02 DIAGNOSIS — F329 Major depressive disorder, single episode, unspecified: Secondary | ICD-10-CM | POA: Diagnosis not present

## 2019-01-09 DIAGNOSIS — F329 Major depressive disorder, single episode, unspecified: Secondary | ICD-10-CM | POA: Diagnosis not present

## 2019-01-17 DIAGNOSIS — F329 Major depressive disorder, single episode, unspecified: Secondary | ICD-10-CM | POA: Diagnosis not present

## 2019-01-24 DIAGNOSIS — F329 Major depressive disorder, single episode, unspecified: Secondary | ICD-10-CM | POA: Diagnosis not present

## 2019-01-31 DIAGNOSIS — F329 Major depressive disorder, single episode, unspecified: Secondary | ICD-10-CM | POA: Diagnosis not present

## 2019-02-07 DIAGNOSIS — F329 Major depressive disorder, single episode, unspecified: Secondary | ICD-10-CM | POA: Diagnosis not present

## 2019-02-14 DIAGNOSIS — F329 Major depressive disorder, single episode, unspecified: Secondary | ICD-10-CM | POA: Diagnosis not present

## 2019-02-21 DIAGNOSIS — F329 Major depressive disorder, single episode, unspecified: Secondary | ICD-10-CM | POA: Diagnosis not present

## 2019-03-07 DIAGNOSIS — F329 Major depressive disorder, single episode, unspecified: Secondary | ICD-10-CM | POA: Diagnosis not present

## 2019-03-12 DIAGNOSIS — F329 Major depressive disorder, single episode, unspecified: Secondary | ICD-10-CM | POA: Diagnosis not present

## 2019-03-21 ENCOUNTER — Telehealth: Payer: Self-pay | Admitting: Adult Health

## 2019-03-21 ENCOUNTER — Telehealth: Payer: Self-pay | Admitting: *Deleted

## 2019-03-21 DIAGNOSIS — F329 Major depressive disorder, single episode, unspecified: Secondary | ICD-10-CM | POA: Diagnosis not present

## 2019-03-21 NOTE — Telephone Encounter (Signed)
Would like a call from [facility] to see if she thinks she needs to make an appointment

## 2019-03-21 NOTE — Telephone Encounter (Signed)
Pt to come in at 9:30 to be seen in am

## 2019-03-21 NOTE — Telephone Encounter (Signed)
Patient states she has noticed some swelling in her vaginal area around her hemorrhoids.  Not painful but noticed them when she tried to use a "cup" after her period.  She has had 3 vaginal births and doesn't know if something has prolapsed.  BM's feel normal. Wants to be seen if needed but your schedule if full. Please advise.

## 2019-03-22 ENCOUNTER — Ambulatory Visit (INDEPENDENT_AMBULATORY_CARE_PROVIDER_SITE_OTHER): Payer: BC Managed Care – PPO | Admitting: Adult Health

## 2019-03-22 ENCOUNTER — Other Ambulatory Visit: Payer: Self-pay

## 2019-03-22 ENCOUNTER — Encounter: Payer: Self-pay | Admitting: Adult Health

## 2019-03-22 VITALS — BP 120/78 | HR 82 | Ht 64.0 in | Wt 205.8 lb

## 2019-03-22 DIAGNOSIS — N816 Rectocele: Secondary | ICD-10-CM | POA: Diagnosis not present

## 2019-03-22 NOTE — Progress Notes (Signed)
Patient ID: Nicole [name], female   DOB: [birth year], 38 y.o.   MRN: [id number] History of Present Illness: Nicole [name] is a 38 year old white female, married, G3P3 in complaining of ?sweelin in vagina near hemorrhoid, and about a week ago was uncomfrotable trying to get get DIVA cup in. PCP is Dr Dennard Schaumann.    Current Medications, Allergies, Past Medical History, Past Surgical History, Family History and Social History were reviewed in [employer] record.     Review of Systems: Has ?swelling in vagina near hemorrhoid Could get DIVA cup in comfortably     Physical Exam:BP 120/78 (BP Location: Right Arm, Patient Position: Sitting, Cuff Size: Normal)   Pulse 82   Ht 5\' 4"  (1.626 m)   Wt 205 lb 12.8 oz (93.4 kg)   LMP 03/10/2019   BMI 35.33 kg/m  General:  Well developed, well nourished, no acute distress Skin:  Warm and dry  Pelvic:  External genitalia is normal in appearance, no lesions.  The vagina is normal in appearance. Urethra has no lesions or masses. The cervix is bulbous, with some relaxation.  Uterus is felt to be normal size, shape, and contour.  No adnexal masses or tenderness noted.Bladder is non tender, no masses felt. Rectal: Good sphincter tone, no polyps, + hemorrhoids felt.  +mild low rectocele. Psych:  No mood changes, alert and cooperative,seems happy Fall risk is low. Showed medical explainer #4, and that when on period has more congestion in pelvic area. Exam performed with her consent without a chaperone.  Impression:  1. Rectocele      Plan: Try DIVA cup now to see if feels better Try not to be constipated Follow up prn

## 2019-03-26 DIAGNOSIS — F329 Major depressive disorder, single episode, unspecified: Secondary | ICD-10-CM | POA: Diagnosis not present

## 2019-04-04 DIAGNOSIS — F329 Major depressive disorder, single episode, unspecified: Secondary | ICD-10-CM | POA: Diagnosis not present

## 2019-04-13 DIAGNOSIS — F329 Major depressive disorder, single episode, unspecified: Secondary | ICD-10-CM | POA: Diagnosis not present

## 2019-04-19 ENCOUNTER — Other Ambulatory Visit: Payer: Self-pay

## 2019-04-19 DIAGNOSIS — Z20822 Contact with and (suspected) exposure to covid-19: Secondary | ICD-10-CM

## 2019-04-20 ENCOUNTER — Telehealth: Payer: BC Managed Care – PPO | Admitting: Physician Assistant

## 2019-04-20 DIAGNOSIS — R519 Headache, unspecified: Secondary | ICD-10-CM

## 2019-04-20 LAB — NOVEL CORONAVIRUS, NAA: SARS-CoV-2, NAA: NOT DETECTED

## 2019-04-20 NOTE — Progress Notes (Signed)
Based on what you shared with me, I feel your condition warrants further evaluation and I recommend that you be seen for a face to face office visit.  Due to the severity of your headache, and no relief with over the counter pain medication, I recommend you be seen face to face to ensure no further testing/imaging is required before receiving further treatment.   NOTE: If you entered your credit card information for this eVisit, you will not be charged. You may see a "hold" on your card for the $35 but that hold will drop off and you will not have a charge processed.  If you are having a true medical emergency please call 911.     The following sites will take your insurance:  . [facility]    [phone]                  Get Driving Directions  [address], [place] [address] . 10 am to 8 pm Monday-Friday . 12 pm to 8 pm Saturday-Sunday   . [facility] [place]                  Get Driving Directions  [address], [place], [place] [address] . 8 am to 8 pm Monday-Friday . 9 am to 6 pm Saturday . 11 am to 6 pm Sunday   . [facility] [place]                  Get Driving Directions   [address] [place], [place] [address] . 8 am to 8 pm Monday-Friday . 8 am to 4 pm Saturday-Sunday    . [facility] at [place]                    Get Driving Directions  [phone]  [address], [place],  [address]  . Monday-Friday, 12 PM to 6 PM    Your e-visit answers were reviewed by a board certified advanced clinical practitioner to complete your personal care plan.  Thank you for using e-Visits.   I have spent 5 minutes in review of e-visit questionnaire, review and updating patient chart, medical decision making and response to patient.    Tenna Delaine, PA-C

## 2019-04-23 ENCOUNTER — Other Ambulatory Visit: Payer: Self-pay

## 2019-04-23 ENCOUNTER — Ambulatory Visit (INDEPENDENT_AMBULATORY_CARE_PROVIDER_SITE_OTHER): Payer: BC Managed Care – PPO | Admitting: Family Medicine

## 2019-04-23 DIAGNOSIS — R509 Fever, unspecified: Secondary | ICD-10-CM | POA: Diagnosis not present

## 2019-04-23 MED ORDER — FLUCONAZOLE 150 MG PO TABS: 150.0000 mg | ORAL_TABLET | Freq: Once | ORAL | 0 refills | Status: AC

## 2019-04-23 MED ORDER — DOXYCYCLINE HYCLATE 100 MG PO TABS: 100.0000 mg | ORAL_TABLET | Freq: Two times a day (BID) | ORAL | 0 refills | Status: DC

## 2019-04-23 NOTE — Progress Notes (Signed)
Subjective:    Patient ID: Willette Mudry, female    DOB: 06-02-1981, 38 y.o.   MRN: [id number]  HPI Patient is a very pleasant 38 year old female who is being seen today as a telephone visit.  Symptoms began 1 week ago on Monday.  She developed a severe headache.  The headache is located between her eyes and behind her eyes.  It is a constant pressure-like headache.  She started taking Tylenol around-the-clock for the headache.  By Wednesday, she developed a fever to 100.8.  This prompted her to go get tested for COVID on Thursday.  She had a negative COVID test on Thursday.  She continues to have a low-grade fever of 100.8 ever since Wednesday of last week.  She also reports a constant dull headache located behind her eyes.  The headache does not radiate although she does now have some mild neck stiffness.  She has had neck stiffness since last Wednesday.  She also has some pain in her neck.  She denies any sinus drainage.  She denies any runny nose.  She denies any sore throat.  She denies any cough.  She denies any nausea vomiting or diarrhea.  She denies any rash.  She did have a tick bite earlier this summer that had a rash associated with it but it resolved on its own and so she did not seek treatment.  She is also been in the [facility] walking quite a bit in the early summer.  She denies any recent tick bite.  She denies any dysuria or urgency or frequency. Past Medical History:  Diagnosis Date  . GERD (gastroesophageal reflux disease)   . Hypothyroidism    Past Surgical History:  Procedure Laterality Date  . MOUTH SURGERY     Current Outpatient Medications on File Prior to Visit  Medication Sig Dispense Refill  . cholecalciferol (VITAMIN D3) 25 MCG (1000 UT) tablet Take 1,000 Units by mouth daily.    . clomiPRAMINE (ANAFRANIL) 25 MG capsule Take 50 mg by mouth at bedtime.     . diclofenac (VOLTAREN) 75 MG EC tablet Take 1 tablet (75 mg total) by mouth 2 (two) times daily. (Patient not  taking: Reported on 03/22/2019) 30 tablet 0  . esomeprazole (NEXIUM) 20 MG packet Take 20 mg by mouth daily.    . fexofenadine (ALLEGRA) 180 MG tablet Take 180 mg by mouth daily.    Marland Kitchen levothyroxine (SYNTHROID, LEVOTHROID) 50 MCG tablet Take 1 tablet (50 mcg total) by mouth daily. 90 tablet 3  . modafinil (PROVIGIL) 100 MG tablet Take 200 mg by mouth daily.     . Multiple Vitamin (MULTIVITAMIN) tablet Take 1 tablet by mouth daily.    . propranolol (INDERAL) 20 MG tablet TK 1 T PO QD  0  . terconazole (TERAZOL 7) 0.4 % vaginal cream Place 1 applicator vaginally at bedtime. (Patient not taking: Reported on 03/22/2019) 45 g 0   No current facility-administered medications on file prior to visit.    No Known Allergies Social History   Socioeconomic History  . Marital status: Married    Spouse name: Not on file  . Number of children: 3  . Years of education: Not on file  . Highest education level: Not on file  Occupational History  . Not on file  Social Needs  . Financial resource strain: Not on file  . Food insecurity    Worry: Not on file    Inability: Not on file  . Transportation needs  Medical: Not on file    Non-medical: Not on file  Tobacco Use  . Smoking status: Never Smoker  . Smokeless tobacco: Never Used  Substance and Sexual Activity  . Alcohol use: No  . Drug use: No  . Sexual activity: Yes    Birth control/protection: Surgical    Comment: vasectomy  Lifestyle  . Physical activity    Days per week: Not on file    Minutes per session: Not on file  . Stress: Not on file  Relationships  . Social Musicianconnections    Talks on phone: Not on file    Gets together: Not on file    Attends religious service: Not on file    Active member of club or organization: Not on file    Attends meetings of clubs or organizations: Not on file    Relationship status: Not on file  . Intimate partner violence    Fear of current or ex partner: Not on file    Emotionally abused: Not on  file    Physically abused: Not on file    Forced sexual activity: Not on file  Other Topics Concern  . Not on file  Social History Narrative  . Not on file      Review of Systems  All other systems reviewed and are negative.      Objective:   Physical Exam  No physical exam could be performed today as patient was seen as a telephone visit.  However she is talking full and complete sentences with no respiratory distress over the telephone.  She is mentating appropriately.  She is in no apparent distress.  She laughed several times while we discussed her situation.  There is no evidence of any neurologic deficit or sepsis.  She states that her heart rate is normal.  She denies any dizziness or lightheadedness or near syncope      Assessment & Plan:  The encounter diagnosis was Fever, unspecified fever cause. Difficult to tell during a telephone encounter.  However the patient certainly does not sound toxic.  We discussed the differential diagnosis.  Her symptoms sound consistent with either a viral meningitis which is mild or possibly a tickborne illness/lymphocytic meningitis such as one can see with Lyme disease.  Infections would also be on the differential diagnosis however the patient states that this headache feels different from her previous sinus infection.  It is also possible that this could just be part of some type of viral syndrome.  We discussed going to the emergency room for a spinal tap.  At the present time the patient declines to go to the emergency room.  She certainly does not sound toxic or septic.  Therefore we discussed her options and she elects to try treatment for tick illness with doxycycline 100 mg p.o. twice daily.  She will recheck in 48 to 72 hours or sooner if worse.  If her symptoms are improving on doxycycline I would asked the patient to come to the clinic for lab work to evaluate for Lyme disease to determine if she needs to complete a 21-day course.  If  symptoms worsen she is directed to go to the emergency room for further evaluation and possible spinal tap.  Patient is comfortable with this plan.  Patient consented to be seen via telephone and understands limitations of a telephone visit.  Phone call began at 1224.  Phone call concluded at 1249.

## 2019-04-25 ENCOUNTER — Ambulatory Visit: Payer: Self-pay | Admitting: Family Medicine

## 2019-04-25 ENCOUNTER — Encounter: Payer: Self-pay | Admitting: Family Medicine

## 2019-04-26 ENCOUNTER — Encounter: Payer: Self-pay | Admitting: Family Medicine

## 2019-04-26 ENCOUNTER — Other Ambulatory Visit: Payer: Self-pay

## 2019-04-26 ENCOUNTER — Ambulatory Visit (INDEPENDENT_AMBULATORY_CARE_PROVIDER_SITE_OTHER): Payer: BC Managed Care – PPO | Admitting: Family Medicine

## 2019-04-26 VITALS — BP 100/60 | HR 100 | Temp 98.9°F | Resp 16 | Ht 64.0 in | Wt 200.0 lb

## 2019-04-26 DIAGNOSIS — Z20822 Contact with and (suspected) exposure to covid-19: Secondary | ICD-10-CM

## 2019-04-26 DIAGNOSIS — R509 Fever, unspecified: Secondary | ICD-10-CM

## 2019-04-26 NOTE — Progress Notes (Signed)
Subjective:    Patient ID: Nicole [name], female    DOB: July 12, 1981, 38 y.o.   MRN: [id number]  HPI  04/23/19 Patient is a very pleasant 38 year old female who is being seen today as a telephone visit.  Symptoms began 1 week ago on Monday.  She developed a severe headache.  The headache is located between her eyes and behind her eyes.  It is a constant pressure-like headache.  She started taking Tylenol around-the-clock for the headache.  By Wednesday, she developed a fever to 100.8.  This prompted her to go get tested for COVID on Thursday.  She had a negative COVID test on Thursday.  She continues to have a low-grade fever of 100.8 ever since Wednesday of last week.  She also reports a constant dull headache located behind her eyes.  The headache does not radiate although she does now have some mild neck stiffness.  She has had neck stiffness since last Wednesday.  She also has some pain in her neck.  She denies any sinus drainage.  She denies any runny nose.  She denies any sore throat.  She denies any cough.  She denies any nausea vomiting or diarrhea.  She denies any rash.  She did have a tick bite earlier this summer that had a rash associated with it but it resolved on its own and so she did not seek treatment.  She is also been in the [facility] walking quite a bit in the early summer.  She denies any recent tick bite.  She denies any dysuria or urgency or frequency.  At that time, my plan was: Difficult to tell during a telephone encounter.  However the patient certainly does not sound toxic.  We discussed the differential diagnosis.  Her symptoms sound consistent with either a viral meningitis which is mild or possibly a tickborne illness/lymphocytic meningitis such as one can see with Lyme disease.  Infections would also be on the differential diagnosis however the patient states that this headache feels different from her previous sinus infection.  It is also possible that this could just be part  of some type of viral syndrome.  We discussed going to the emergency room for a spinal tap.  At the present time the patient declines to go to the emergency room.  She certainly does not sound toxic or septic.  Therefore we discussed her options and she elects to try treatment for tick illness with doxycycline 100 mg p.o. twice daily.  She will recheck in 48 to 72 hours or sooner if worse.  If her symptoms are improving on doxycycline I would asked the patient to come to the clinic for lab work to evaluate for Lyme disease to determine if she needs to complete a 21-day course.  If symptoms worsen she is directed to go to the emergency room for further evaluation and possible spinal tap.  Patient is comfortable with this plan.  Patient consented to be seen via telephone and understands limitations of a telephone visit.  Phone call began at 1224.  Phone call concluded at 1249.  04/26/19 Patient contacted me yesterday stating that she was still having fevers and a dull headache.  However since she contacted me yesterday morning, her fevers have subsided.  Her highest temperature has been 99.  She still has dull pressure in between her eyes directly in the center of her forehead however it is also better.  She is starting to feel better now.  She denies any other  symptoms at all.  She denies any otalgia.  She denies any rhinorrhea.  She denies any sore throat.  She denies any sinus pain.  The stiffness in her neck has completely resolved.  She has no meningismus.  She denies any nausea or vomiting or diarrhea.  She denies any dysuria.  She denies any cough.  She denies any shortness of breath or chest pain.  She has no swelling in her legs.  There is no visible rash anywhere on her body.  She denies any vaginal discharge.  Symptoms seem to have improved since starting the doxycycline. Past Medical History:  Diagnosis Date   GERD (gastroesophageal reflux disease)    Hypothyroidism    Past Surgical History:   Procedure Laterality Date   MOUTH SURGERY     Current Outpatient Medications on File Prior to Visit  Medication Sig Dispense Refill   cholecalciferol (VITAMIN D3) 25 MCG (1000 UT) tablet Take 1,000 Units by mouth daily.     clomiPRAMINE (ANAFRANIL) 50 MG capsule Take 50 mg by mouth at bedtime.      doxycycline (VIBRA-TABS) 100 MG tablet Take 1 tablet (100 mg total) by mouth 2 (two) times daily. 42 tablet 0   esomeprazole (NEXIUM) 20 MG packet Take 20 mg by mouth daily.     fexofenadine (ALLEGRA) 180 MG tablet Take 180 mg by mouth daily.     levothyroxine (SYNTHROID, LEVOTHROID) 50 MCG tablet Take 1 tablet (50 mcg total) by mouth daily. 90 tablet 3   modafinil (PROVIGIL) 100 MG tablet Take 200 mg by mouth daily.      Multiple Vitamin (MULTIVITAMIN) tablet Take 1 tablet by mouth daily.     propranolol (INDERAL) 20 MG tablet TK 1 T PO QD  0   diclofenac (VOLTAREN) 75 MG EC tablet Take 1 tablet (75 mg total) by mouth 2 (two) times daily. (Patient not taking: Reported on 03/22/2019) 30 tablet 0   terconazole (TERAZOL 7) 0.4 % vaginal cream Place 1 applicator vaginally at bedtime. (Patient not taking: Reported on 03/22/2019) 45 g 0   No current facility-administered medications on file prior to visit.    No Known Allergies Social History   Socioeconomic History   Marital status: Married    Spouse name: Not on file   Number of children: 3   Years of education: Not on file   Highest education level: Not on file  Occupational History   Not on file  Social Needs   Financial resource strain: Not on file   Food insecurity    Worry: Not on file    Inability: Not on file   Transportation needs    Medical: Not on file    Non-medical: Not on file  Tobacco Use   Smoking status: Never Smoker   Smokeless tobacco: Never Used  Substance and Sexual Activity   Alcohol use: No   Drug use: No   Sexual activity: Yes    Birth control/protection: Surgical    Comment:  vasectomy  Lifestyle   Physical activity    Days per week: Not on file    Minutes per session: Not on file   Stress: Not on file  Relationships   Social connections    Talks on phone: Not on file    Gets together: Not on file    Attends religious service: Not on file    Active member of club or organization: Not on file    Attends meetings of clubs or organizations: Not on file  Relationship status: Not on file   Intimate partner violence    Fear of current or ex partner: Not on file    Emotionally abused: Not on file    Physically abused: Not on file    Forced sexual activity: Not on file  Other Topics Concern   Not on file  Social History Narrative   Not on file      Review of Systems  All other systems reviewed and are negative.      Objective:   Physical Exam Vitals signs reviewed.  Constitutional:      General: She is not in acute distress.    Appearance: Normal appearance. She is normal weight. She is not ill-appearing or toxic-appearing.  HENT:     Head: Normocephalic and atraumatic.     Right Ear: Tympanic membrane and ear canal normal.     Left Ear: Tympanic membrane and ear canal normal.     Nose: Nose normal. No congestion or rhinorrhea.     Mouth/Throat:     Mouth: Mucous membranes are moist.     Pharynx: No oropharyngeal exudate or posterior oropharyngeal erythema.  Eyes:     General:        Right eye: No discharge.        Left eye: No discharge.     Conjunctiva/sclera: Conjunctivae normal.  Neck:     Musculoskeletal: Normal range of motion and neck supple. No neck rigidity or muscular tenderness.  Cardiovascular:     Rate and Rhythm: Normal rate and regular rhythm.     Pulses: Normal pulses.     Heart sounds: Normal heart sounds.  Pulmonary:     Effort: Pulmonary effort is normal. No respiratory distress.     Breath sounds: Normal breath sounds. No stridor. No wheezing, rhonchi or rales.  Chest:     Chest wall: No tenderness.   Abdominal:     General: Abdomen is flat. Bowel sounds are normal. There is no distension.     Palpations: Abdomen is soft. There is no mass.     Tenderness: There is no abdominal tenderness. There is no guarding or rebound.     Hernia: No hernia is present.  Musculoskeletal:     Right lower leg: No edema.     Left lower leg: No edema.  Lymphadenopathy:     Cervical: No cervical adenopathy.  Skin:    Findings: No rash.  Neurological:     General: No focal deficit present.     Mental Status: She is alert and oriented to person, place, and time.     Cranial Nerves: No cranial nerve deficit.     Sensory: No sensory deficit.     Motor: No weakness.     Coordination: Coordination normal.     Gait: Gait normal.     Assessment & Plan:  The encounter diagnosis was Fever, unspecified fever cause.  Differential diagnosis includes viral syndrome, false negative coronavirus test with COVID-19 infection, tickborne illness responding to doxycycline, or an atypical sinus infection.  At the present time, the patient is symptomatically improving.  However we will obtain labs including a Lyme titer, [facility] fever [facility], CBC, CMP, and also recommended a repeat COVID-19 test to ensure that this was not a false negative.  I have recommended quarantine until the COVID-19 test returns on Monday and I will await the results for lab work.  In the meantime I recommended that she complete the doxycycline.

## 2019-04-28 LAB — NOVEL CORONAVIRUS, NAA: SARS-CoV-2, NAA: NOT DETECTED

## 2019-04-30 ENCOUNTER — Encounter: Payer: Self-pay | Admitting: Family Medicine

## 2019-04-30 LAB — CBC WITH DIFFERENTIAL/PLATELET
Absolute Monocytes: 689 cells/uL (ref 200–950)
Basophils Absolute: 141 cells/uL (ref 0–200)
Basophils Relative: 1.7 %
Eosinophils Absolute: 0 cells/uL — ABNORMAL LOW (ref 15–500)
Eosinophils Relative: 0 %
HCT: 38 % (ref 35.0–45.0)
Hemoglobin: 12.8 g/dL (ref 11.7–15.5)
Lymphs Abs: 5470 cells/uL — ABNORMAL HIGH (ref 850–3900)
MCH: 30.7 pg (ref 27.0–33.0)
MCHC: 33.7 g/dL (ref 32.0–36.0)
MCV: 91.1 fL (ref 80.0–100.0)
MPV: 10.2 fL (ref 7.5–12.5)
Monocytes Relative: 8.3 %
Neutro Abs: 2000 cells/uL (ref 1500–7800)
Neutrophils Relative %: 24.1 %
Platelets: 219 10*3/uL (ref 140–400)
RBC: 4.17 10*6/uL (ref 3.80–5.10)
RDW: 12.1 % (ref 11.0–15.0)
Total Lymphocyte: 65.9 %
WBC: 8.3 10*3/uL (ref 3.8–10.8)

## 2019-04-30 LAB — COMPLETE METABOLIC PANEL WITH GFR
AG Ratio: 1.5 (calc) (ref 1.0–2.5)
ALT: 136 U/L — ABNORMAL HIGH (ref 6–29)
AST: 75 U/L — ABNORMAL HIGH (ref 10–30)
Albumin: 4.2 g/dL (ref 3.6–5.1)
Alkaline phosphatase (APISO): 127 U/L — ABNORMAL HIGH (ref 31–125)
BUN: 9 mg/dL (ref 7–25)
CO2: 27 mmol/L (ref 20–32)
Calcium: 9.3 mg/dL (ref 8.6–10.2)
Chloride: 100 mmol/L (ref 98–110)
Creat: 0.69 mg/dL (ref 0.50–1.10)
GFR, Est African American: 127 mL/min/{1.73_m2} (ref >=60)
GFR, Est Non African American: 110 mL/min/{1.73_m2} (ref >=60)
Globulin: 2.8 g/dL (calc) (ref 1.9–3.7)
Glucose, Bld: 163 mg/dL — ABNORMAL HIGH (ref 65–99)
Potassium: 4.1 mmol/L (ref 3.5–5.3)
Sodium: 138 mmol/L (ref 135–146)
Total Bilirubin: 0.4 mg/dL (ref 0.2–1.2)
Total Protein: 7 g/dL (ref 6.1–8.1)

## 2019-04-30 LAB — B. BURGDORFI ANTIBODIES BY WB
B burgdorferi IgG Abs (IB): NEGATIVE
B burgdorferi IgM Abs (IB): NEGATIVE
Lyme Disease 18 kD IgG: REACTIVE — AB
Lyme Disease 23 kD IgG: NONREACTIVE
Lyme Disease 23 kD IgM: NONREACTIVE
Lyme Disease 28 kD IgG: NONREACTIVE
Lyme Disease 30 kD IgG: NONREACTIVE
Lyme Disease 39 kD IgG: NONREACTIVE
Lyme Disease 39 kD IgM: NONREACTIVE
Lyme Disease 41 kD IgG: REACTIVE — AB
Lyme Disease 41 kD IgM: NONREACTIVE
Lyme Disease 45 kD IgG: NONREACTIVE
Lyme Disease 58 kD IgG: REACTIVE — AB
Lyme Disease 66 kD IgG: NONREACTIVE
Lyme Disease 93 kD IgG: NONREACTIVE

## 2019-04-30 LAB — ROCKY MTN SPOTTED FVR ABS PNL(IGG+IGM)
RMSF IgG: NOT DETECTED
RMSF IgM: NOT DETECTED

## 2019-05-01 DIAGNOSIS — R7989 Other specified abnormal findings of blood chemistry: Secondary | ICD-10-CM

## 2019-05-02 ENCOUNTER — Encounter: Payer: Self-pay | Admitting: Family Medicine

## 2019-05-03 ENCOUNTER — Encounter: Payer: Self-pay | Admitting: Family Medicine

## 2019-05-03 NOTE — Telephone Encounter (Signed)
Faxed lab orders to [facility].   MD please advise about snoring/ tonsils.

## 2019-05-04 ENCOUNTER — Other Ambulatory Visit: Payer: Self-pay

## 2019-05-04 ENCOUNTER — Ambulatory Visit (INDEPENDENT_AMBULATORY_CARE_PROVIDER_SITE_OTHER): Payer: BC Managed Care – PPO | Admitting: Family Medicine

## 2019-05-04 VITALS — BP 136/80 | HR 104 | Temp 98.7°F | Resp 14 | Ht 64.0 in | Wt 204.0 lb

## 2019-05-04 DIAGNOSIS — J039 Acute tonsillitis, unspecified: Secondary | ICD-10-CM | POA: Diagnosis not present

## 2019-05-04 DIAGNOSIS — R945 Abnormal results of liver function studies: Secondary | ICD-10-CM

## 2019-05-04 DIAGNOSIS — R7989 Other specified abnormal findings of blood chemistry: Secondary | ICD-10-CM

## 2019-05-04 MED ORDER — PREDNISONE 20 MG PO TABS: ORAL_TABLET | ORAL | 0 refills | Status: DC

## 2019-05-04 NOTE — Progress Notes (Deleted)
Acute Office Visit  Subjective:    Patient ID: Nicole [name], female    DOB: Apr 04, 1981, 38 y.o.   MRN: [id number]  Chief Complaint  Patient presents with  . Throat issues    HPI Patient is in today for please see the last visit.  Patient's lab work is listed below: Office Visit on 05/04/2019  Component Date Value Ref Range Status  . Streptococcus, Group A Screen (Dir* 05/04/2019 NONE DETECTED   Final  Orders Only on 04/26/2019  Component Date Value Ref Range Status  . SARS-CoV-2, NAA 04/26/2019 Not Detected  Not Detected Final   Comment: This test was developed and its performance characteristics determined by [employer]. This test has not been FDA cleared or approved. This test has been authorized by FDA under an Emergency Use Authorization (EUA). This test is only authorized for the duration of time the declaration that circumstances exist justifying the authorization of the emergency use of in vitro diagnostic tests for detection of SARS-CoV-2 virus and/or diagnosis of COVID-19 infection under section 564(b)(1) of the Act, 21 U.S.C. 272ZDG-6(Y)(4), unless the authorization is terminated or revoked sooner. When diagnostic testing is negative, the possibility of a false negative result should be considered in the context of a patient's recent exposures and the presence of clinical signs and symptoms consistent with COVID-19. An individual without symptoms of COVID-19 and who is not shedding SARS-CoV-2 virus would expect to have a negative (not detected) result in this assay.   Office Visit on 04/26/2019  Component Date Value Ref Range Status  . Glucose, Bld 04/26/2019 163* 65 - 99 mg/dL Final   Comment: .            Fasting reference interval . For someone without known diabetes, a glucose value >125 mg/dL indicates that they may have diabetes and this should be confirmed with a follow-up test. .   . BUN 04/26/2019 9  7 - 25 mg/dL Final  . Creat 03/47/4259  0.69  0.50 - 1.10 mg/dL Final  . GFR, Est Non African American 04/26/2019 110  > OR = 60 mL/min/1.34m2 Final  . GFR, Est African American 04/26/2019 127  > OR = 60 mL/min/1.12m2 Final  . BUN/Creatinine Ratio 04/26/2019 NOT APPLICABLE  6 - 22 (calc) Final  . Sodium 04/26/2019 138  135 - 146 mmol/L Final  . Potassium 04/26/2019 4.1  3.5 - 5.3 mmol/L Final  . Chloride 04/26/2019 100  98 - 110 mmol/L Final  . CO2 04/26/2019 27  20 - 32 mmol/L Final  . Calcium 04/26/2019 9.3  8.6 - 10.2 mg/dL Final  . Total Protein 04/26/2019 7.0  6.1 - 8.1 g/dL Final  . Albumin 56/38/7564 4.2  3.6 - 5.1 g/dL Final  . Globulin 33/29/5188 2.8  1.9 - 3.7 g/dL (calc) Final  . AG Ratio 04/26/2019 1.5  1.0 - 2.5 (calc) Final  . Total Bilirubin 04/26/2019 0.4  0.2 - 1.2 mg/dL Final  . Alkaline phosphatase (APISO) 04/26/2019 127* 31 - 125 U/L Final  . AST 04/26/2019 75* 10 - 30 U/L Final  . ALT 04/26/2019 136* 6 - 29 U/L Final  . WBC 04/26/2019 8.3  3.8 - 10.8 Thousand/uL Final  . RBC 04/26/2019 4.17  3.80 - 5.10 Million/uL Final  . Hemoglobin 04/26/2019 12.8  11.7 - 15.5 g/dL Final  . HCT 41/66/0630 38.0  35.0 - 45.0 % Final  . MCV 04/26/2019 91.1  80.0 - 100.0 fL Final  . MCH 04/26/2019 30.7  27.0 -  33.0 pg Final  . MCHC 04/26/2019 33.7  32.0 - 36.0 g/dL Final  . RDW 16/10/960408/13/2020 12.1  11.0 - 15.0 % Final  . Platelets 04/26/2019 219  140 - 400 Thousand/uL Final  . MPV 04/26/2019 10.2  7.5 - 12.5 fL Final  . Neutro Abs 04/26/2019 2,000  1,500 - 7,800 cells/uL Final  . Lymphs Abs 04/26/2019 5,470* 850 - 3,900 cells/uL Final  . Absolute Monocytes 04/26/2019 689  200 - 950 cells/uL Final  . Eosinophils Absolute 04/26/2019 0* 15 - 500 cells/uL Final  . Basophils Absolute 04/26/2019 141  0 - 200 cells/uL Final  . Neutrophils Relative % 04/26/2019 24.1  % Final  . Total Lymphocyte 04/26/2019 65.9  % Final  . Monocytes Relative 04/26/2019 8.3  % Final  . Eosinophils Relative 04/26/2019 0.0  % Final  . Basophils  Relative 04/26/2019 1.7  % Final  . Smear Review 04/26/2019    Final   Comment: Absolute lymphocytosis and atypical lymphs, which appear reactive. Favor reactive process. However, if absolute lymphocytosis persists, suggest immunophenotyping by flow cytometry for further evaluation, if clinically indicated. Myeloid population consists predominantly of mature segmented neutrophils with mild left shift and mild reactive changes. No immature cells are identified. Favor reactive process. RBCs and platelets are unremarkable. Reviewed by Dallas BreedingJanice J. Hessling, MD, PhD, FCAP (Electronic Signature on File)   04/27/2019   . RMSF IgG 04/26/2019 NOT DETECTED  NOT DETECT Final  . RMSF IgM 04/26/2019 NOT DETECTED  NOT DETECT Final  . B burgdorferi IgG Abs (IB) 04/26/2019 NEGATIVE  NEGATIVE Final  . Lyme Disease 18 kD IgG 04/26/2019 REACTIVE*  Final  . Lyme Disease 23 kD IgG 04/26/2019 NON-REACTIVE   Final  . Lyme Disease 28 kD IgG 04/26/2019 NON-REACTIVE   Final  . Lyme Disease 30 kD IgG 04/26/2019 NON-REACTIVE   Final  . Lyme Disease 39 kD IgG 04/26/2019 NON-REACTIVE   Final  . Lyme Disease 41 kD IgG 04/26/2019 REACTIVE*  Final  . Lyme Disease 45 kD IgG 04/26/2019 NON-REACTIVE   Final  . Lyme Disease 58 kD IgG 04/26/2019 REACTIVE*  Final  . Lyme Disease 66 kD IgG 04/26/2019 NON-REACTIVE   Final  . Lyme Disease 93 kD IgG 04/26/2019 NON-REACTIVE   Final  . B burgdorferi IgM Abs (IB) 04/26/2019 NEGATIVE  NEGATIVE Final  . Lyme Disease 23 kD IgM 04/26/2019 NON-REACTIVE   Final  . Lyme Disease 39 kD IgM 04/26/2019 NON-REACTIVE   Final  . Lyme Disease 41 kD IgM 04/26/2019 NON-REACTIVE   Final   Comment: As per CDC criteria, a Lyme disease IgG Immunoblot must show reactivity to at least 5 of 10 specific borrelial proteins to be considered positive; similarly, a  positive Lyme disease IgM immunoblot requires reactivity to 2 of 3 specific borrelial proteins. Although considered negative, IgG reactivity  to fewer specific borrelial proteins or IgM reactivity to only 1 protein may indicate recent B. burgdorferi infection and warrant testing of a later sample. A positive IgM but negative IgG result obtained more than a month after onset of symptoms likely represents a false- positive IgM result rather than acute Lyme disease. In rare instances, Lyme disease immunoblot reactivity may represent antibodies induced by exposure to other spirochetes.  . Lyme immunoblot testing should only be performed on samples from patients who have had a Positive or Equivocal result in a screening assay.   Orders Only on 04/19/2019  Component Date Value Ref Range Status  . SARS-CoV-2, NAA 04/19/2019 Not Detected  Not Detected Final   Comment: This test was developed and its performance characteristics determined by [employer]. This test has not been FDA cleared or approved. This test has been authorized by FDA under an Emergency Use Authorization (EUA). This test is only authorized for the duration of time the declaration that circumstances exist justifying the authorization of the emergency use of in vitro diagnostic tests for detection of SARS-CoV-2 virus and/or diagnosis of COVID-19 infection under section 564(b)(1) of the Act, 21 U.S.C. 427CWC-3(J)(6), unless the authorization is terminated or revoked sooner. When diagnostic testing is negative, the possibility of a false negative result should be considered in the context of a patient's recent exposures and the presence of clinical signs and symptoms consistent with COVID-19. An individual without symptoms of COVID-19 and who is not shedding SARS-CoV-2 virus would expect to have a negative (not detected) result in this assay.    I suspect that the patient has EBV or CMV.  She called yesterday stating that her tonsils were swollen and had pus on them.  Today her tonsils are +1 in size.  There are prominent crypts with white solid material  filling the crypts.  She denies any pain.  She denies any fever.  Her strep test today was negative.  She does report snoring due to the size of the tonsils.  She feels like she is unable to breathe as well when she lays down at night to sleep.  She woke up twice last night feeling like she had to gasp to catch her breath.  She is no longer having the headaches or neck pain.  She denies any cough or nausea or vomiting.  She does report fatigue  Past Medical History:  Diagnosis Date  . GERD (gastroesophageal reflux disease)   . Hypothyroidism     Past Surgical History:  Procedure Laterality Date  . MOUTH SURGERY      Family History  Problem Relation Age of Onset  . Hypertension Father   . Hypertension Maternal Grandmother   . Depression Maternal Grandmother   . Hypertension Paternal Grandmother   . Anxiety disorder Paternal Grandmother   . Macular degeneration Paternal Grandmother   . Diabetes Paternal Grandfather   . Depression Maternal Grandfather   . GER disease Sister     Social History   Socioeconomic History  . Marital status: Married    Spouse name: Not on file  . Number of children: 3  . Years of education: Not on file  . Highest education level: Not on file  Occupational History  . Not on file  Social Needs  . Financial resource strain: Not on file  . Food insecurity    Worry: Not on file    Inability: Not on file  . Transportation needs    Medical: Not on file    Non-medical: Not on file  Tobacco Use  . Smoking status: Never Smoker  . Smokeless tobacco: Never Used  Substance and Sexual Activity  . Alcohol use: No  . Drug use: No  . Sexual activity: Yes    Birth control/protection: Surgical    Comment: vasectomy  Lifestyle  . Physical activity    Days per week: Not on file    Minutes per session: Not on file  . Stress: Not on file  Relationships  . Social Herbalist on phone: Not on file    Gets together: Not on file    Attends religious  service: Not on file  Active member of club or organization: Not on file    Attends meetings of clubs or organizations: Not on file    Relationship status: Not on file  . Intimate partner violence    Fear of current or ex partner: Not on file    Emotionally abused: Not on file    Physically abused: Not on file    Forced sexual activity: Not on file  Other Topics Concern  . Not on file  Social History Narrative  . Not on file    Outpatient Medications Prior to Visit  Medication Sig Dispense Refill  . cholecalciferol (VITAMIN D3) 25 MCG (1000 UT) tablet Take 1,000 Units by mouth daily.    . clomiPRAMINE (ANAFRANIL) 50 MG capsule Take 50 mg by mouth at bedtime.     Marland Kitchen. doxycycline (VIBRA-TABS) 100 MG tablet Take 1 tablet (100 mg total) by mouth 2 (two) times daily. 42 tablet 0  . esomeprazole (NEXIUM) 20 MG packet Take 20 mg by mouth daily.    . fexofenadine (ALLEGRA) 180 MG tablet Take 180 mg by mouth daily.    Marland Kitchen. levothyroxine (SYNTHROID, LEVOTHROID) 50 MCG tablet Take 1 tablet (50 mcg total) by mouth daily. 90 tablet 3  . modafinil (PROVIGIL) 100 MG tablet Take 200 mg by mouth daily.     . Multiple Vitamin (MULTIVITAMIN) tablet Take 1 tablet by mouth daily.    . propranolol (INDERAL) 20 MG tablet TK 1 T PO QD  0  . terconazole (TERAZOL 7) 0.4 % vaginal cream Place 1 applicator vaginally at bedtime. 45 g 0  . diclofenac (VOLTAREN) 75 MG EC tablet Take 1 tablet (75 mg total) by mouth 2 (two) times daily. (Patient not taking: Reported on 03/22/2019) 30 tablet 0   No facility-administered medications prior to visit.     No Known Allergies  ROS     Objective:    Physical Exam  Constitutional: She appears well-developed and well-nourished.  HENT:  Mouth/Throat: Oropharyngeal exudate, posterior oropharyngeal edema and posterior oropharyngeal erythema present. No tonsillar abscesses.  Cardiovascular: Normal rate and regular rhythm.  Pulmonary/Chest: Effort normal and breath sounds  normal. No respiratory distress. She has no wheezes. She has no rales.  Abdominal: Soft. Bowel sounds are normal. She exhibits no distension. There is no abdominal tenderness. There is no rebound.  Lymphadenopathy:    She has no cervical adenopathy.    BP 136/80   Pulse (!) 104   Temp 98.7 F (37.1 C) (Oral)   Resp 14   Ht 5\' 4"  (1.626 m)   Wt 204 lb (92.5 kg)   SpO2 98%   BMI 35.02 kg/m  Wt Readings from Last 3 Encounters:  05/04/19 204 lb (92.5 kg)  04/26/19 200 lb (90.7 kg)  03/22/19 205 lb 12.8 oz (93.4 kg)    Health Maintenance Due  Topic Date Due  . INFLUENZA VACCINE  04/14/2019    There are no preventive care reminders to display for this patient.   Lab Results  Component Value Date   TSH 1.640 08/25/2018   Lab Results  Component Value Date   WBC 8.3 04/26/2019   HGB 12.8 04/26/2019   HCT 38.0 04/26/2019   MCV 91.1 04/26/2019   PLT 219 04/26/2019   Lab Results  Component Value Date   NA 138 04/26/2019   K 4.1 04/26/2019   CO2 27 04/26/2019   GLUCOSE 163 (H) 04/26/2019   BUN 9 04/26/2019   CREATININE 0.69 04/26/2019   BILITOT 0.4 04/26/2019  ALKPHOS 67 08/25/2018   AST 75 (H) 04/26/2019   ALT 136 (H) 04/26/2019   PROT 7.0 04/26/2019   ALBUMIN 4.1 08/25/2018   CALCIUM 9.3 04/26/2019   Lab Results  Component Value Date   CHOL 163 08/25/2018   Lab Results  Component Value Date   HDL 43 08/25/2018   Lab Results  Component Value Date   LDLCALC 87 08/25/2018   Lab Results  Component Value Date   TRIG 164 (H) 08/25/2018   Lab Results  Component Value Date   CHOLHDL 3.8 08/25/2018   Lab Results  Component Value Date   HGBA1C 6.0 (H) 09/21/2018       Assessment & Plan:   Problem List Items Addressed This Visit    None    Visit Diagnoses    Elevated LFTs    -  Primary   Relevant Orders   Epstein-Barr virus VCA, IgG   Epstein-Barr virus VCA, IgM   CMV abs, IgG+IgM (cytomegalovirus)   STREP GROUP A AG, W/REFLEX TO CULT  (Completed)   CBC with Differential/Platelet   Tonsillitis       Relevant Orders   Epstein-Barr virus VCA, IgG   Epstein-Barr virus VCA, IgM   CMV abs, IgG+IgM (cytomegalovirus)   STREP GROUP A AG, W/REFLEX TO CULT (Completed)   CBC with Differential/Platelet       Meds ordered this encounter  Medications  . predniSONE (DELTASONE) 20 MG tablet    Sig: 3 tabs poqday 1-2, 2 tabs poqday 3-4, 1 tab poqday 5-6    Dispense:  12 tablet    Refill:  0   I feel the patient most likely has EBV or CMV explaining her symptoms.  She can stop doxycycline.  I will check EBV titers and CMV titers.  Strep test was negative.  I will repeat liver function test along with a CBC.  I recommended the patient use Magic mouthwash 1 teaspoon gargle and spit every 4 hours in addition to ibuprofen 800 mg every 8 hours.  However patient would like to try prednisone as we also discussed to help with the swelling and the fatigue.  Therefore I will put her on a prednisone taper pack.  If symptoms change she is to seek medical attention immediately.  Clinically she is improving other than the tonsillitis.  Donita BrooksWarren T Cariana Karge, MD

## 2019-05-06 LAB — CULTURE, GROUP A STREP
MICRO NUMBER:: 797670
SPECIMEN QUALITY:: ADEQUATE

## 2019-05-06 LAB — STREP GROUP A AG, W/REFLEX TO CULT: Streptococcus, Group A Screen (Direct): NOT DETECTED

## 2019-05-08 LAB — CMV ABS, IGG+IGM (CYTOMEGALOVIRUS)
CMV IgM: <30 AU/mL
Cytomegalovirus Ab-IgG: <0.6 U/mL

## 2019-05-08 LAB — CBC WITH DIFFERENTIAL/PLATELET
Absolute Monocytes: 624 cells/uL (ref 200–950)
Basophils Absolute: 52 cells/uL (ref 0–200)
Basophils Relative: 0.8 %
Eosinophils Absolute: 52 cells/uL (ref 15–500)
Eosinophils Relative: 0.8 %
HCT: 38.7 % (ref 35.0–45.0)
Hemoglobin: 13 g/dL (ref 11.7–15.5)
Lymphs Abs: 3874 cells/uL (ref 850–3900)
MCH: 30.7 pg (ref 27.0–33.0)
MCHC: 33.6 g/dL (ref 32.0–36.0)
MCV: 91.5 fL (ref 80.0–100.0)
MPV: 9.6 fL (ref 7.5–12.5)
Monocytes Relative: 9.6 %
Neutro Abs: 1898 cells/uL (ref 1500–7800)
Neutrophils Relative %: 29.2 %
Platelets: 289 10*3/uL (ref 140–400)
RBC: 4.23 10*6/uL (ref 3.80–5.10)
RDW: 12 % (ref 11.0–15.0)
Total Lymphocyte: 59.6 %
WBC: 6.5 10*3/uL (ref 3.8–10.8)

## 2019-05-08 LAB — EPSTEIN-BARR VIRUS VCA, IGM: EBV VCA IgM: <36 U/mL

## 2019-05-08 LAB — EPSTEIN-BARR VIRUS VCA, IGG: EBV VCA IgG: 58.5 U/mL — ABNORMAL HIGH

## 2019-05-09 ENCOUNTER — Other Ambulatory Visit: Payer: Self-pay | Admitting: Family Medicine

## 2019-05-11 ENCOUNTER — Encounter: Payer: Self-pay | Admitting: Family Medicine

## 2019-05-11 MED ORDER — FLUCONAZOLE 150 MG PO TABS: 150.0000 mg | ORAL_TABLET | Freq: Once | ORAL | 0 refills | Status: AC

## 2019-05-17 ENCOUNTER — Other Ambulatory Visit: Payer: Self-pay

## 2019-05-17 ENCOUNTER — Other Ambulatory Visit: Payer: BC Managed Care – PPO

## 2019-05-17 DIAGNOSIS — R945 Abnormal results of liver function studies: Secondary | ICD-10-CM | POA: Diagnosis not present

## 2019-05-17 DIAGNOSIS — R7989 Other specified abnormal findings of blood chemistry: Secondary | ICD-10-CM

## 2019-05-18 LAB — COMPLETE METABOLIC PANEL WITH GFR
AG Ratio: 1.8 (calc) (ref 1.0–2.5)
ALT: 13 U/L (ref 6–29)
AST: 17 U/L (ref 10–30)
Albumin: 4.2 g/dL (ref 3.6–5.1)
Alkaline phosphatase (APISO): 61 U/L (ref 31–125)
BUN: 12 mg/dL (ref 7–25)
CO2: 26 mmol/L (ref 20–32)
Calcium: 9.5 mg/dL (ref 8.6–10.2)
Chloride: 100 mmol/L (ref 98–110)
Creat: 0.69 mg/dL (ref 0.50–1.10)
GFR, Est African American: 127 mL/min/{1.73_m2} (ref >=60)
GFR, Est Non African American: 110 mL/min/{1.73_m2} (ref >=60)
Globulin: 2.4 g/dL (calc) (ref 1.9–3.7)
Glucose, Bld: 144 mg/dL — ABNORMAL HIGH (ref 65–99)
Potassium: 4.2 mmol/L (ref 3.5–5.3)
Sodium: 136 mmol/L (ref 135–146)
Total Bilirubin: 0.5 mg/dL (ref 0.2–1.2)
Total Protein: 6.6 g/dL (ref 6.1–8.1)

## 2019-05-22 ENCOUNTER — Other Ambulatory Visit: Payer: BC Managed Care – PPO

## 2019-05-22 DIAGNOSIS — F329 Major depressive disorder, single episode, unspecified: Secondary | ICD-10-CM | POA: Diagnosis not present

## 2019-06-11 DIAGNOSIS — F329 Major depressive disorder, single episode, unspecified: Secondary | ICD-10-CM | POA: Diagnosis not present

## 2019-06-14 DIAGNOSIS — F422 Mixed obsessional thoughts and acts: Secondary | ICD-10-CM | POA: Diagnosis not present

## 2019-06-14 DIAGNOSIS — F331 Major depressive disorder, recurrent, moderate: Secondary | ICD-10-CM | POA: Diagnosis not present

## 2019-06-14 DIAGNOSIS — F424 Excoriation (skin-picking) disorder: Secondary | ICD-10-CM | POA: Diagnosis not present

## 2019-07-05 DIAGNOSIS — F329 Major depressive disorder, single episode, unspecified: Secondary | ICD-10-CM | POA: Diagnosis not present

## 2019-07-09 ENCOUNTER — Other Ambulatory Visit: Payer: Self-pay

## 2019-07-09 DIAGNOSIS — F411 Generalized anxiety disorder: Secondary | ICD-10-CM | POA: Diagnosis not present

## 2019-07-09 DIAGNOSIS — F331 Major depressive disorder, recurrent, moderate: Secondary | ICD-10-CM | POA: Diagnosis not present

## 2019-07-09 DIAGNOSIS — F424 Excoriation (skin-picking) disorder: Secondary | ICD-10-CM | POA: Diagnosis not present

## 2019-07-09 DIAGNOSIS — F422 Mixed obsessional thoughts and acts: Secondary | ICD-10-CM | POA: Diagnosis not present

## 2019-07-10 ENCOUNTER — Ambulatory Visit (INDEPENDENT_AMBULATORY_CARE_PROVIDER_SITE_OTHER): Payer: BC Managed Care – PPO | Admitting: Family Medicine

## 2019-07-10 ENCOUNTER — Encounter: Payer: Self-pay | Admitting: Family Medicine

## 2019-07-10 VITALS — BP 122/90 | HR 78 | Temp 97.9°F | Resp 14 | Ht 64.0 in | Wt 209.0 lb

## 2019-07-10 DIAGNOSIS — H9193 Unspecified hearing loss, bilateral: Secondary | ICD-10-CM | POA: Diagnosis not present

## 2019-07-10 DIAGNOSIS — R42 Dizziness and giddiness: Secondary | ICD-10-CM

## 2019-07-10 DIAGNOSIS — H9313 Tinnitus, bilateral: Secondary | ICD-10-CM

## 2019-07-10 DIAGNOSIS — Z23 Encounter for immunization: Secondary | ICD-10-CM

## 2019-07-10 NOTE — Progress Notes (Signed)
Subjective:    Patient ID: Nicole [name], female    DOB: May 05, 1981, 38 y.o.   MRN: [id number]  HPI Patient has been on clomipramine and modafinil for almost a year.  Recently she weaned herself off of these about 2 weeks ago due to some memory issues that she was having on the medication and the fact the depression was better.  Since that time she reports a persistent nausea every day.  She describes it as similar to morning sickness.  She is not vomiting however she feels nauseated to the point that she often does not eat or drink due to the unsettled nature of her stomach.  However the chance of being pregnant is highly unlikely.  She has 1 sexual partner.  This is her husband.  He has had a vasectomy.  She has not missed her menstrual cycle.  Along with the nausea, she has had episodic dizziness.  It was severe 1 day and it seemed more like vertigo.  There was definitely a blocking movement sensation to the environment.  During the same time she reports bilateral hearing loss right greater than left and also tinnitus in the ear right greater than left.  On the exam today, tympanic membrane appeared normal bilaterally.  There is no middle ear effusion.  There is no erythema.  There is no obstruction in the canal.  Hearing screen today is normal.  She hears every frequency down to 20 dB in both ear.  There is no objective hearing loss.  Neurologic exam is completely normal. Past Medical History:  Diagnosis Date  . GERD (gastroesophageal reflux disease)   . Hypothyroidism    Past Surgical History:  Procedure Laterality Date  . MOUTH SURGERY     Current Outpatient Medications on File Prior to Visit  Medication Sig Dispense Refill  . cholecalciferol (VITAMIN D3) 25 MCG (1000 UT) tablet Take 1,000 Units by mouth daily.    . clomiPRAMINE (ANAFRANIL) 50 MG capsule Take 50 mg by mouth at bedtime.     . diclofenac (VOLTAREN) 75 MG EC tablet Take 1 tablet (75 mg total) by mouth 2 (two) times daily.  (Patient not taking: Reported on 03/22/2019) 30 tablet 0  . doxycycline (VIBRA-TABS) 100 MG tablet Take 1 tablet (100 mg total) by mouth 2 (two) times daily. 42 tablet 0  . esomeprazole (NEXIUM) 20 MG packet Take 20 mg by mouth daily.    . fexofenadine (ALLEGRA) 180 MG tablet Take 180 mg by mouth daily.    Marland Kitchen levothyroxine (SYNTHROID, LEVOTHROID) 50 MCG tablet Take 1 tablet (50 mcg total) by mouth daily. 90 tablet 3  . modafinil (PROVIGIL) 100 MG tablet Take 200 mg by mouth daily.     . Multiple Vitamin (MULTIVITAMIN) tablet Take 1 tablet by mouth daily.    . predniSONE (DELTASONE) 20 MG tablet 3 tabs poqday 1-2, 2 tabs poqday 3-4, 1 tab poqday 5-6 12 tablet 0  . propranolol (INDERAL) 20 MG tablet TK 1 T PO QD  0  . terconazole (TERAZOL 7) 0.4 % vaginal cream Place 1 applicator vaginally at bedtime. 45 g 0   No current facility-administered medications on file prior to visit.    ASSESSMENT:ll Social History   Socioeconomic History  . Marital status: Married    Spouse name: Not on file  . Number of children: 3  . Years of education: Not on file  . Highest education level: Not on file  Occupational History  . Not on file  Social  Needs  . Financial resource strain: Not on file  . Food insecurity    Worry: Not on file    Inability: Not on file  . Transportation needs    Medical: Not on file    Non-medical: Not on file  Tobacco Use  . Smoking status: Never Smoker  . Smokeless tobacco: Never Used  Substance and Sexual Activity  . Alcohol use: No  . Drug use: No  . Sexual activity: Yes    Birth control/protection: Surgical    Comment: vasectomy  Lifestyle  . Physical activity    Days per week: Not on file    Minutes per session: Not on file  . Stress: Not on file  Relationships  . Social Musicianconnections    Talks on phone: Not on file    Gets together: Not on file    Attends religious service: Not on file    Active member of club or organization: Not on file    Attends meetings  of clubs or organizations: Not on file    Relationship status: Not on file  . Intimate partner violence    Fear of current or ex partner: Not on file    Emotionally abused: Not on file    Physically abused: Not on file    Forced sexual activity: Not on file  Other Topics Concern  . Not on file  Social History Narrative  . Not on file     Review of Systems  All other systems reviewed and are negative.      Objective:   Physical Exam Vitals signs reviewed.  Constitutional:      General: She is not in acute distress.    Appearance: Normal appearance. She is obese. She is not ill-appearing or toxic-appearing.  HENT:     Right Ear: Tympanic membrane, ear canal and external ear normal. There is no impacted cerumen.     Left Ear: Tympanic membrane, ear canal and external ear normal. There is no impacted cerumen.     Nose: Nose normal. No congestion or rhinorrhea.  Cardiovascular:     Rate and Rhythm: Normal rate and regular rhythm.     Pulses: Normal pulses.     Heart sounds: Normal heart sounds. No murmur. No friction rub.  Neurological:     General: No focal deficit present.     Mental Status: She is alert and oriented to person, place, and time. Mental status is at baseline.     Cranial Nerves: No cranial nerve deficit.     Sensory: No sensory deficit.     Motor: No weakness.     Coordination: Coordination normal.     Gait: Gait normal.     Deep Tendon Reflexes: Reflexes normal.           Assessment & Plan:  Need for immunization against influenza - Plan: Flu Vaccine QUAD 36+ mos IM  Tinnitus aurium, bilateral  Need for immunization against influenza - Plan: Flu Vaccine QUAD 36+ mos IM  Hearing loss associated with syndrome of both ears  Dizziness  Her exam today is completely normal.  The question is if the symptoms are due to medication withdrawal or if the patient could be suffering from something such as Mnire's disease.  Mnire's disease actually runs  in her family on her paternal aunt.  Patient was concerned about some type of brain tumor.  I believe this is highly unlikely given the lack of headache and her normal neurologic exam.  However I  am concerned about Mnire's disease.  Today her symptoms are mild.  I recommended clinical surveillance for the next 1 to 2 weeks.  If this is in fact medication withdrawal, the symptoms should gradually improve and subside.  If they do not improve, I would recommend an MRI of the brain, empiric therapy with hydrochlorothiazide for possible Mnire's disease, and consultation with ENT.  Recheck the patient in 7 to 14 days

## 2019-07-12 DIAGNOSIS — F329 Major depressive disorder, single episode, unspecified: Secondary | ICD-10-CM | POA: Diagnosis not present

## 2019-07-16 DIAGNOSIS — F329 Major depressive disorder, single episode, unspecified: Secondary | ICD-10-CM | POA: Diagnosis not present

## 2019-07-26 DIAGNOSIS — F329 Major depressive disorder, single episode, unspecified: Secondary | ICD-10-CM | POA: Diagnosis not present

## 2019-07-30 DIAGNOSIS — F329 Major depressive disorder, single episode, unspecified: Secondary | ICD-10-CM | POA: Diagnosis not present

## 2019-07-31 ENCOUNTER — Encounter: Payer: Self-pay | Admitting: Family Medicine

## 2019-07-31 DIAGNOSIS — H9319 Tinnitus, unspecified ear: Secondary | ICD-10-CM

## 2019-08-07 DIAGNOSIS — E039 Hypothyroidism, unspecified: Secondary | ICD-10-CM | POA: Diagnosis not present

## 2019-08-07 DIAGNOSIS — R5382 Chronic fatigue, unspecified: Secondary | ICD-10-CM | POA: Diagnosis not present

## 2019-08-07 DIAGNOSIS — R7303 Prediabetes: Secondary | ICD-10-CM | POA: Diagnosis not present

## 2019-08-08 ENCOUNTER — Other Ambulatory Visit: Payer: Self-pay | Admitting: Family Medicine

## 2019-08-08 ENCOUNTER — Encounter: Payer: Self-pay | Admitting: Family Medicine

## 2019-08-08 DIAGNOSIS — I10 Essential (primary) hypertension: Secondary | ICD-10-CM | POA: Diagnosis not present

## 2019-08-08 DIAGNOSIS — E039 Hypothyroidism, unspecified: Secondary | ICD-10-CM | POA: Diagnosis not present

## 2019-08-08 MED ORDER — HYDROCHLOROTHIAZIDE 25 MG PO TABS: 25.0000 mg | ORAL_TABLET | Freq: Every day | ORAL | 1 refills | Status: DC

## 2019-08-13 DIAGNOSIS — F424 Excoriation (skin-picking) disorder: Secondary | ICD-10-CM | POA: Diagnosis not present

## 2019-08-13 DIAGNOSIS — L814 Other melanin hyperpigmentation: Secondary | ICD-10-CM | POA: Diagnosis not present

## 2019-08-13 DIAGNOSIS — F422 Mixed obsessional thoughts and acts: Secondary | ICD-10-CM | POA: Diagnosis not present

## 2019-08-13 DIAGNOSIS — F331 Major depressive disorder, recurrent, moderate: Secondary | ICD-10-CM | POA: Diagnosis not present

## 2019-08-13 DIAGNOSIS — L821 Other seborrheic keratosis: Secondary | ICD-10-CM | POA: Diagnosis not present

## 2019-08-13 DIAGNOSIS — D225 Melanocytic nevi of trunk: Secondary | ICD-10-CM | POA: Diagnosis not present

## 2019-08-13 DIAGNOSIS — F411 Generalized anxiety disorder: Secondary | ICD-10-CM | POA: Diagnosis not present

## 2019-08-15 DIAGNOSIS — F329 Major depressive disorder, single episode, unspecified: Secondary | ICD-10-CM | POA: Diagnosis not present

## 2019-08-31 DIAGNOSIS — E039 Hypothyroidism, unspecified: Secondary | ICD-10-CM | POA: Diagnosis not present

## 2019-09-01 ENCOUNTER — Other Ambulatory Visit: Payer: Self-pay | Admitting: Family Medicine

## 2019-09-03 DIAGNOSIS — F329 Major depressive disorder, single episode, unspecified: Secondary | ICD-10-CM | POA: Diagnosis not present

## 2019-09-04 ENCOUNTER — Other Ambulatory Visit: Payer: Self-pay | Admitting: Family Medicine

## 2019-09-06 DIAGNOSIS — H9311 Tinnitus, right ear: Secondary | ICD-10-CM | POA: Diagnosis not present

## 2019-09-06 DIAGNOSIS — H6981 Other specified disorders of Eustachian tube, right ear: Secondary | ICD-10-CM | POA: Diagnosis not present

## 2019-09-15 ENCOUNTER — Encounter: Payer: Self-pay | Admitting: Family Medicine

## 2019-09-26 ENCOUNTER — Encounter (INDEPENDENT_AMBULATORY_CARE_PROVIDER_SITE_OTHER): Payer: Self-pay | Admitting: Bariatrics

## 2019-09-26 ENCOUNTER — Other Ambulatory Visit: Payer: Self-pay

## 2019-09-26 ENCOUNTER — Ambulatory Visit (INDEPENDENT_AMBULATORY_CARE_PROVIDER_SITE_OTHER): Payer: BC Managed Care – PPO | Admitting: Bariatrics

## 2019-09-26 VITALS — BP 112/76 | HR 76 | Temp 98.6°F | Ht 65.0 in | Wt 210.0 lb

## 2019-09-26 DIAGNOSIS — K219 Gastro-esophageal reflux disease without esophagitis: Secondary | ICD-10-CM

## 2019-09-26 DIAGNOSIS — E038 Other specified hypothyroidism: Secondary | ICD-10-CM

## 2019-09-26 DIAGNOSIS — E559 Vitamin D deficiency, unspecified: Secondary | ICD-10-CM | POA: Diagnosis not present

## 2019-09-26 DIAGNOSIS — Z6834 Body mass index (BMI) 34.0-34.9, adult: Secondary | ICD-10-CM

## 2019-09-26 DIAGNOSIS — F3289 Other specified depressive episodes: Secondary | ICD-10-CM | POA: Diagnosis not present

## 2019-09-26 DIAGNOSIS — Z0289 Encounter for other administrative examinations: Secondary | ICD-10-CM

## 2019-09-26 DIAGNOSIS — R7303 Prediabetes: Secondary | ICD-10-CM | POA: Diagnosis not present

## 2019-09-26 DIAGNOSIS — R0602 Shortness of breath: Secondary | ICD-10-CM | POA: Diagnosis not present

## 2019-09-26 DIAGNOSIS — E6609 Other obesity due to excess calories: Secondary | ICD-10-CM

## 2019-09-26 DIAGNOSIS — R5383 Other fatigue: Secondary | ICD-10-CM | POA: Diagnosis not present

## 2019-09-26 DIAGNOSIS — Z9189 Other specified personal risk factors, not elsewhere classified: Secondary | ICD-10-CM

## 2019-09-27 ENCOUNTER — Encounter (INDEPENDENT_AMBULATORY_CARE_PROVIDER_SITE_OTHER): Payer: Self-pay | Admitting: Bariatrics

## 2019-09-27 DIAGNOSIS — E7849 Other hyperlipidemia: Secondary | ICD-10-CM | POA: Insufficient documentation

## 2019-09-27 DIAGNOSIS — E781 Pure hyperglyceridemia: Secondary | ICD-10-CM | POA: Insufficient documentation

## 2019-09-27 DIAGNOSIS — R7303 Prediabetes: Secondary | ICD-10-CM | POA: Insufficient documentation

## 2019-09-27 LAB — COMPREHENSIVE METABOLIC PANEL
ALT: 17 IU/L (ref 0–32)
AST: 19 IU/L (ref 0–40)
Albumin/Globulin Ratio: 1.7 (ref 1.2–2.2)
Albumin: 4.4 g/dL (ref 3.8–4.8)
Alkaline Phosphatase: 68 IU/L (ref 39–117)
BUN/Creatinine Ratio: 14 (ref 9–23)
BUN: 8 mg/dL (ref 6–20)
Bilirubin Total: 0.5 mg/dL (ref 0.0–1.2)
CO2: 25 mmol/L (ref 20–29)
Calcium: 9.6 mg/dL (ref 8.7–10.2)
Chloride: 101 mmol/L (ref 96–106)
Creatinine, Ser: 0.59 mg/dL (ref 0.57–1.00)
GFR calc Af Amer: 134 mL/min/{1.73_m2} (ref >59)
GFR calc non Af Amer: 116 mL/min/{1.73_m2} (ref >59)
Globulin, Total: 2.6 g/dL (ref 1.5–4.5)
Glucose: 118 mg/dL — ABNORMAL HIGH (ref 65–99)
Potassium: 4.2 mmol/L (ref 3.5–5.2)
Sodium: 138 mmol/L (ref 134–144)
Total Protein: 7 g/dL (ref 6.0–8.5)

## 2019-09-27 LAB — LIPID PANEL WITH LDL/HDL RATIO
Cholesterol, Total: 157 mg/dL (ref 100–199)
HDL: 38 mg/dL — ABNORMAL LOW (ref >39)
LDL Chol Calc (NIH): 90 mg/dL (ref 0–99)
LDL/HDL Ratio: 2.4 ratio (ref 0.0–3.2)
Triglycerides: 170 mg/dL — ABNORMAL HIGH (ref 0–149)
VLDL Cholesterol Cal: 29 mg/dL (ref 5–40)

## 2019-09-27 LAB — VITAMIN D 25 HYDROXY (VIT D DEFICIENCY, FRACTURES): Vit D, 25-Hydroxy: 36.7 ng/mL (ref 30.0–100.0)

## 2019-09-27 LAB — INSULIN, RANDOM: INSULIN: 14.8 u[IU]/mL (ref 2.6–24.9)

## 2019-09-27 LAB — HEMOGLOBIN A1C
Est. average glucose Bld gHb Est-mCnc: 128 mg/dL
Hgb A1c MFr Bld: 6.1 % — ABNORMAL HIGH (ref 4.8–5.6)

## 2019-09-27 NOTE — Progress Notes (Addendum)
Chief Complaint:   OBESITY Nicole [name] (MR# [id number]) is a 39 y.o. female who presents for evaluation and treatment of obesity and related comorbidities. Current BMI is Body mass index is 34.95 kg/m.Marland Kitchen Nicole [name] has been struggling with her weight for many years and has been unsuccessful in either losing weight, maintaining weight loss, or reaching her healthy weight goal.  Nicole [name] states she is currently in the action stage of change and ready to dedicate time achieving and maintaining a healthier weight. Nicole [name] is interested in becoming our patient and working on intensive lifestyle modifications including (but not limited to) diet and exercise for weight loss.  Nicole [name] states she likes to [employer], but struggles with being fatigued if she has not planned ahead.  She craves sugar and carbohydrates.  She occasionally skips meals.  Nicole [name]'s habits were reviewed today and are as follows: Her family eats meals together, she thinks her family will eat healthier with her, her desired weight loss is 51 pounds, she has been heavy most of her adult life, she started gaining weight in college, her heaviest weight ever was 218 pounds, she snacks frequently in the evenings, she occasionally skips meals, she frequently makes poor food choices, she sometimes eats larger portions than normal and she struggles with emotional eating.  Recent labs reviewed:  CMP, CBC, glucose (from 04/26/2019 and 05/04/2019)  Depression Screen Nicole [name]'s Food and Mood (modified PHQ-9) score was 16.  Depression screen PHQ 2/9 09/26/2019  Decreased Interest 2  Down, Depressed, Hopeless 1  PHQ - 2 Score 3  Altered sleeping 3  Tired, decreased energy 3  Change in appetite 3  Feeling bad or failure about yourself  1  Trouble concentrating 3  Moving slowly or fidgety/restless 0  Suicidal thoughts 0  PHQ-9 Score 16  Difficult doing work/chores Somewhat difficult   Subjective:   1. Other fatigue Nicole [name] feels her energy is lower than  it should be. This has worsened with weight gain and has worsened recently. Nicole [name] admits to daytime somnolence and reports waking up still tired. Patient is at risk for obstructive sleep apnea. Patient generally gets 6 or 7 hours of sleep per night, and states they sometimes sleep well. Snoring is present. Apneic episodes are not present. Epworth Sleepiness Score is 11.  2. Shortness of breath on exertion Nicole [name] notes increasing shortness of breath with exercising and seems to be worsening over time with weight gain. She notes getting out of breath sooner with activity than she used to. This has gotten worse recently. Nicole [name] denies shortness of breath at rest or orthopnea.  3. Other specified hypothyroidism Nicole [name] is currently taking Synthroid 50 mcg daily and is well controlled on this.  Lab Results  Component Value Date   TSH 1.640 08/25/2018   4. Gastroesophageal reflux disease, unspecified whether esophagitis present She has been experiencing belching.  She takes Pepcid or Nexium over the counter for this.  5. Vitamin D deficiency Nicole [name]'s Vitamin D level was 25.4 on 08/25/2018. She is currently taking vit D 1000 IU daily. She denies nausea, vomiting or muscle weakness.  6. Prediabetes Nicole [name] has a diagnosis of prediabetes based on her elevated HgA1c and was informed this puts her at greater risk of developing diabetes. She continues to work on diet and exercise to decrease her risk of diabetes. She denies nausea or hypoglycemia.  She is not taking any medications.  She states her appetite is normal.  7. Other depression, with emotional eating Nicole [name] is struggling with  emotional eating and using food for comfort to the extent that it is negatively impacting her health. She often snacks when she is not hungry. Nicole [name] sometimes feels she is out of control and then feels guilty that she made poor food choices. She has been working on behavior modification techniques to help reduce her emotional  eating and has been unsuccessful. She shows no sign of suicidal or homicidal ideations.  She sees a Veterinary surgeon.  8. At risk for hypoglycemia Nicole [name] is at increased risk for hypoglycemia due to changes in diet, diagnosis of diabetes, and/or insulin use. Nicole [name] is not not currently taking insulin.   Assessment/Plan:   1. Other fatigue Nicole [name] was informed fatigue may be related to obesity, depression or many other causes. Labs will be ordered, and in the meanwhile Nicole [name] has agreed to work on diet, exercise and weight loss. - EKG 12-Lead  2. Shortness of breath on exertion Nicole [name]'s shortness of breath appears to be obesity related and exercise induced. She has agreed to work on weight loss and gradually increase exercise to treat her exercise induced shortness of breath. Will continue to monitor closely. - Lipid Panel With LDL/HDL Ratio  3. Other specified hypothyroidism Patient with long-standing hypothyroidism, on levothyroxine therapy. She appears euthyroid. Orders and follow up as documented in patient record.  Counseling . Good thyroid control is important for overall health. Supratherapeutic thyroid levels are dangerous and will not improve weight loss results. . The correct way to take levothyroxine is fasting, with water, separated by at least 30 minutes from breakfast, and separated by more than 4 hours from calcium, iron, multivitamins, acid reflux medications (PPIs).   4. Gastroesophageal reflux disease, unspecified whether esophagitis present Intensive lifestyle modifications are the first line treatment for this issue. We discussed several lifestyle modifications today and she will continue to work on diet, exercise and weight loss efforts. Orders and follow up as documented in patient record.  She should continue medications and decrease triggers.   Counseling . If a person has gastroesophageal reflux disease (GERD), food and stomach acid move back up into the esophagus and cause  symptoms or problems such as damage to the esophagus. . Anti-reflux measures include: raising the head of the bed, avoiding tight clothing or belts, avoiding eating late at night, not lying down shortly after mealtime, and achieving weight loss. . Avoid ASA, NSAID's, caffeine, alcohol, and tobacco.  . OTC Pepcid and/or Tums are often very helpful for as needed use.  Marland Kitchen However, for persisting chronic or daily symptoms, stronger medications like Omeprazole may be needed. . You may need to avoid foods and drinks such as: ? Coffee and tea (with or without caffeine). ? Drinks that contain alcohol. ? Energy drinks and sports drinks. ? Bubbly (carbonated) drinks or sodas. ? Chocolate and cocoa. ? Peppermint and mint flavorings. ? Garlic and onions. ? Horseradish. ? Spicy and acidic foods. These include peppers, chili powder, curry powder, vinegar, hot sauces, and BBQ sauce. ? Citrus fruit juices and citrus fruits, such as oranges, lemons, and limes. ? Tomato-based foods. These include red sauce, chili, salsa, and pizza with red sauce. ? Fried and fatty foods. These include donuts, french fries, potato chips, and high-fat dressings. ? High-fat meats. These include hot dogs, rib eye steak, sausage, ham, and bacon.  5. Vitamin D deficiency Low Vitamin D level contributes to fatigue and are associated with obesity, breast, and colon cancer. She agrees to continue to take prescription Vitamin D @1 ,000 IU everyday and  will follow-up for routine testing of vitamin D, at least 2-3 times per year to avoid over-replacement. - Vitamin D (25 hydroxy)  6. Prediabetes Nicole [name] will continue to work on weight loss, exercise, and decreasing simple carbohydrates to help decrease the risk of diabetes.  - HgB A1c - Insulin, random - Comprehensive Metabolic Panel (CMET)  7. Other depression, with emotional eating Behavior modification techniques were discussed today to help Nicole [name] deal with her emotional/non-hunger  eating behaviors.  Orders and follow up as documented in patient record.  Patient was referred to Dr. Dewaine Conger, our Bariatric Psychologist, for evaluation due to her elevated PHQ-9 score and significant struggles with emotional eating.  8. At risk for hypoglycemia Nicole [name] was given approximately 15 minutes of counseling today regarding prevention of hypoglycemia secondary to prediabetes and dietary changes. . She was advised of symptoms of hypoglycemia. Nicole [name] was instructed to eat regular meals.   9. Class 2 severe obesity with serious comorbidity and body mass index (BMI) of 35.0 to 35.9 in adult, unspecified obesity type [facility]) Nicole [name] is currently in the action stage of change and her goal is to continue with weight loss efforts. I recommend Nicole [name] begin the structured treatment plan as follows:  She has agreed to go on the Category 3 Plan.  We discussed the following exercise goals today: For substantial health benefits, adults should do at least 150 minutes (2 hours and 30 minutes) a week of moderate-intensity, or 75 minutes (1 hour and 15 minutes) a week of vigorous-intensity aerobic physical activity, or an equivalent combination of moderate- and vigorous-intensity aerobic activity. Aerobic activity should be performed in episodes of at least 10 minutes, and preferably, it should be spread throughout the week. Adults should also include muscle-strengthening activities that involve all major muscle groups on 2 or more days a week.   We discussed the following behavioral modification strategies today: increasing lean protein intake, decreasing simple carbohydrates, increasing vegetables, increasing water intake, decreasing eating out, no skipping meals, meal planning and cooking strategies, keeping healthy foods in the home and planning for success.  She was informed of the importance of frequent follow-up visits to maximize her success with intensive lifestyle modifications for her multiple health  conditions. She was informed we would discuss her lab results at her next visit unless there is a critical issue that needs to be addressed sooner. Nicole [name] agreed to keep her next visit at the agreed upon time to discuss these results.  Objective:   Blood pressure 112/76, pulse 76, temperature 98.6 F (37 C), height 5\' 5"  (1.651 m), weight 210 lb (95.3 kg), last menstrual period 08/27/2019, SpO2 99 %. Body mass index is 34.95 kg/m.  EKG: Normal sinus rhythm, rate 73 bpm.  Indirect Calorimeter completed today shows a VO2 of 299 and a REE of 2082.  Her calculated basal metabolic rate is 2083 thus her basal metabolic rate is better than expected.  General: Cooperative, alert, well developed, in no acute distress. HEENT: Conjunctivae and lids unremarkable. Neck: No thyromegaly.  Cardiovascular: Regular rhythm.  Lungs: Normal work of breathing. Extremities: No edema.  Neurologic: No focal deficits.   Lab Results  Component Value Date   CREATININE 0.59 09/26/2019   BUN 8 09/26/2019   NA 138 09/26/2019   K 4.2 09/26/2019   CL 101 09/26/2019   CO2 25 09/26/2019   Lab Results  Component Value Date   ALT 17 09/26/2019   AST 19 09/26/2019   ALKPHOS 68 09/26/2019   BILITOT 0.5 09/26/2019  Lab Results  Component Value Date   HGBA1C 6.1 (H) 09/26/2019   HGBA1C 6.0 (H) 09/21/2018   HGBA1C 6.0 (H) 05/31/2014   Lab Results  Component Value Date   INSULIN 14.8 09/26/2019   Lab Results  Component Value Date   TSH 1.640 08/25/2018   Lab Results  Component Value Date   CHOL 157 09/26/2019   HDL 38 (L) 09/26/2019   LDLCALC 90 09/26/2019   TRIG 170 (H) 09/26/2019   CHOLHDL 3.8 08/25/2018   Lab Results  Component Value Date   WBC 6.5 05/04/2019   HGB 13.0 05/04/2019   HCT 38.7 05/04/2019   MCV 91.5 05/04/2019   PLT 289 05/04/2019   Attestation Statements:   Reviewed by clinician on day of visit: allergies, medications, problem list, medical history, surgical history, family  history, social history, and previous encounter notes.  I, Water quality scientist, CMA, am acting as Location manager for [employer], DO.  I have reviewed the above documentation for accuracy and completeness, and I agree with the above. Jearld Lesch, DO

## 2019-09-29 ENCOUNTER — Encounter (INDEPENDENT_AMBULATORY_CARE_PROVIDER_SITE_OTHER): Payer: Self-pay | Admitting: Bariatrics

## 2019-09-29 DIAGNOSIS — E669 Obesity, unspecified: Secondary | ICD-10-CM | POA: Insufficient documentation

## 2019-10-02 DIAGNOSIS — F329 Major depressive disorder, single episode, unspecified: Secondary | ICD-10-CM | POA: Diagnosis not present

## 2019-10-10 ENCOUNTER — Encounter (INDEPENDENT_AMBULATORY_CARE_PROVIDER_SITE_OTHER): Payer: Self-pay | Admitting: Bariatrics

## 2019-10-10 ENCOUNTER — Other Ambulatory Visit: Payer: Self-pay

## 2019-10-10 ENCOUNTER — Ambulatory Visit (INDEPENDENT_AMBULATORY_CARE_PROVIDER_SITE_OTHER): Payer: BC Managed Care – PPO | Admitting: Bariatrics

## 2019-10-10 VITALS — BP 111/70 | HR 79 | Temp 98.6°F | Ht 65.0 in | Wt 207.0 lb

## 2019-10-10 DIAGNOSIS — Z9189 Other specified personal risk factors, not elsewhere classified: Secondary | ICD-10-CM | POA: Diagnosis not present

## 2019-10-10 DIAGNOSIS — R7303 Prediabetes: Secondary | ICD-10-CM

## 2019-10-10 DIAGNOSIS — E559 Vitamin D deficiency, unspecified: Secondary | ICD-10-CM | POA: Diagnosis not present

## 2019-10-10 DIAGNOSIS — E786 Lipoprotein deficiency: Secondary | ICD-10-CM | POA: Diagnosis not present

## 2019-10-10 DIAGNOSIS — Z6834 Body mass index (BMI) 34.0-34.9, adult: Secondary | ICD-10-CM

## 2019-10-10 DIAGNOSIS — E669 Obesity, unspecified: Secondary | ICD-10-CM

## 2019-10-10 MED ORDER — VITAMIN D (ERGOCALCIFEROL) 1.25 MG (50000 UNIT) PO CAPS: 50000.0000 [IU] | ORAL_CAPSULE | ORAL | 0 refills | Status: DC

## 2019-10-11 ENCOUNTER — Encounter (INDEPENDENT_AMBULATORY_CARE_PROVIDER_SITE_OTHER): Payer: Self-pay | Admitting: Bariatrics

## 2019-10-11 NOTE — Progress Notes (Signed)
Chief Complaint:   OBESITY Nicole [name] is here to discuss her progress with her obesity treatment plan along with follow-up of her obesity related diagnoses. Nicole [name] is on the Category 3 Plan and states she is following her eating plan approximately 30% of the time. Nicole [name] states she is walking 20 minutes 2 times per week.  Today's visit was #: 2 Starting weight: 210 lbs Starting date: 09/26/2019 Today's weight: 207 lbs Today's date: 10/10/2019 Total lbs lost to date: 3 Total lbs lost since last in-office visit: 3  Interim History: Nicole [name] is down 3 lbs. She did not feel like the plan was too bad. She denies significant cravings.  Subjective:   Vitamin D deficiency. Nicole [name] is not taking Vitamin D. Last Vitamin D level was 36.7 on 09/26/2019.  Low HDL (under 40). Nicole [name] had a low HDL level of 38 on 09/26/2019.  Prediabetes. Nicole [name] has a diagnosis of prediabetes based on her elevated HgA1c and was informed this puts her at greater risk of developing diabetes. She continues to work on diet and exercise to decrease her risk of diabetes. She denies nausea or hypoglycemia. She will not skip meals.  Lab Results  Component Value Date   HGBA1C 6.1 (H) 09/26/2019   Lab Results  Component Value Date   INSULIN 14.8 09/26/2019   At risk for hypoglycemia. Nicole [name] is at increased risk for hypoglycemia due to carbohydrates and protein. She is not skipping meals.  Nicole [name] is not currently taking insulin.   Assessment/Plan:   Vitamin D deficiency.   Low Vitamin D level contributes to fatigue and are associated with obesity, breast, and colon cancer. She was given a prescription for Vitamin D, Ergocalciferol, (DRISDOL) 1.25 MG (50000 UNIT) CAPS capsule every week #4 with 0 refills and will follow-up for routine testing of Vitamin D, at least 2-3 times per year to avoid over-replacement.   Low HDL (under 40). Nicole [name] will decrease saturated fats, increase MUFA's and PUFA's, and will  exercise.  Prediabetes. Nicole [name] will continue to work on weight loss, exercise, increasing healthy fats and protein, and decreasing simple carbohydrates to help decrease the risk of diabetes. She was given handout on Insulin Resistance and Prediabetes.  At risk for hypoglycemia. Itsel was given approximately 15 minutes of counseling today regarding prevention of hypoglycemia. She was advised of symptoms of hypoglycemia. Nicole [name] was instructed to eat regular meals.   Class 1 obesity with serious comorbidity and body mass index (BMI) of 34.0 to 34.9 in adult, unspecified obesity type.  Nicole [name] is currently in the action stage of change. As such, her goal is to continue with weight loss efforts. She has agreed to the Category 3 Plan.   She will work on meal planning and intentional eating.  She was given handout on  Protein Content of Foods and additional lunch and dinner options.  Exercise goals: All adults should avoid inactivity. Some physical activity is better than none, and adults who participate in any amount of physical activity gain some health benefits.  Behavioral modification strategies: increasing lean protein intake, decreasing simple carbohydrates, increasing vegetables, increasing water intake, decreasing eating out, no skipping meals, meal planning and cooking strategies, keeping healthy foods in the home and planning for success.  Nicole [name] has agreed to follow-up with our clinic in 2 weeks. She was informed of the importance of frequent follow-up visits to maximize her success with intensive lifestyle modifications for her multiple health conditions.   Objective:   Blood pressure 111/70, pulse 79,  temperature 98.6 F (37 C), height 5\' 5"  (1.651 m), weight 207 lb (93.9 kg), last menstrual period 09/26/2019, SpO2 98 %. Body mass index is 34.45 kg/m.  General: Cooperative, alert, well developed, in no acute distress. HEENT: Conjunctivae and lids unremarkable. Cardiovascular: Regular  rhythm.  Lungs: Normal work of breathing. Neurologic: No focal deficits.   Lab Results  Component Value Date   CREATININE 0.59 09/26/2019   BUN 8 09/26/2019   NA 138 09/26/2019   K 4.2 09/26/2019   CL 101 09/26/2019   CO2 25 09/26/2019   Lab Results  Component Value Date   ALT 17 09/26/2019   AST 19 09/26/2019   ALKPHOS 68 09/26/2019   BILITOT 0.5 09/26/2019   Lab Results  Component Value Date   HGBA1C 6.1 (H) 09/26/2019   HGBA1C 6.0 (H) 09/21/2018   HGBA1C 6.0 (H) 05/31/2014   Lab Results  Component Value Date   INSULIN 14.8 09/26/2019   Lab Results  Component Value Date   TSH 1.640 08/25/2018   Lab Results  Component Value Date   CHOL 157 09/26/2019   HDL 38 (L) 09/26/2019   LDLCALC 90 09/26/2019   TRIG 170 (H) 09/26/2019   CHOLHDL 3.8 08/25/2018   Lab Results  Component Value Date   WBC 6.5 05/04/2019   HGB 13.0 05/04/2019   HCT 38.7 05/04/2019   MCV 91.5 05/04/2019   PLT 289 05/04/2019   No results found for: IRON, TIBC, FERRITIN  Attestation Statements:   Reviewed by clinician on day of visit: allergies, medications, problem list, medical history, surgical history, family history, social history, and previous encounter notes.  Migdalia Dk, am acting as Location manager for [employer], DO   I have reviewed the above documentation for accuracy and completeness, and I agree with the above. Jearld Lesch, DO

## 2019-10-16 DIAGNOSIS — F329 Major depressive disorder, single episode, unspecified: Secondary | ICD-10-CM | POA: Diagnosis not present

## 2019-10-20 DIAGNOSIS — G4733 Obstructive sleep apnea (adult) (pediatric): Secondary | ICD-10-CM | POA: Diagnosis not present

## 2019-10-24 ENCOUNTER — Other Ambulatory Visit: Payer: Self-pay

## 2019-10-24 ENCOUNTER — Ambulatory Visit (INDEPENDENT_AMBULATORY_CARE_PROVIDER_SITE_OTHER): Payer: BC Managed Care – PPO | Admitting: Family Medicine

## 2019-10-24 ENCOUNTER — Encounter (INDEPENDENT_AMBULATORY_CARE_PROVIDER_SITE_OTHER): Payer: Self-pay | Admitting: Family Medicine

## 2019-10-24 VITALS — BP 126/74 | HR 82 | Temp 98.6°F | Ht 65.0 in | Wt 207.0 lb

## 2019-10-24 DIAGNOSIS — E669 Obesity, unspecified: Secondary | ICD-10-CM

## 2019-10-24 DIAGNOSIS — Z6834 Body mass index (BMI) 34.0-34.9, adult: Secondary | ICD-10-CM

## 2019-10-24 DIAGNOSIS — F3289 Other specified depressive episodes: Secondary | ICD-10-CM | POA: Diagnosis not present

## 2019-10-24 NOTE — Progress Notes (Signed)
Office: [phone]  /  Fax: [phone]    Date: October 31, 2019   Appointment Start Time: 11:01am Duration: 32 minutes Provider: Glennie [name], Psy.D. Type of Session: Intake for Individual Therapy  Location of Patient: Home Location of Provider: Provider's Home Type of Contact: Telepsychological Visit via [employer] Web[employer]  Informed Consent: Prior to proceeding with today's appointment, two pieces of identifying information were obtained. In addition, Nicole [name]'s physical location at the time of this appointment was obtained as well a phone number she could be reached at in the event of technical difficulties. Nicole [name] and this provider participated in today's telepsychological service.   The provider's role was explained to Nicole [name]. The provider reviewed and discussed issues of confidentiality, privacy, and limits therein (e.g., reporting obligations). In addition to verbal informed consent, written informed consent for psychological services was obtained prior to the initial appointment. Since the clinic is not a 24/7 crisis center, mental health emergency resources were shared and this  provider explained MyChart, e-mail, voicemail, and/or other messaging systems should be utilized only for non-emergency reasons. This provider also explained that information obtained during appointments will be placed in Nicole [name]'s medical record and relevant information will be shared with other providers at [facility] for coordination of care. Moreover, Nicole [name] agreed information may be shared with other [facility] providers as needed for coordination of care. By signing the service agreement document, Nicole [name] provided written consent for coordination of care. Prior to initiating telepsychological services, Nicole [name] completed an informed consent document, which included the development of a safety plan (i.e., an emergency contact, nearest emergency room, and emergency resources) in the event  of an emergency/crisis. Nicole [name] expressed understanding of the rationale of the safety plan. Nicole [name] verbally acknowledged understanding she is ultimately responsible for understanding her insurance benefits for telepsychological and in-person services. This provider also reviewed confidentiality, as it relates to telepsychological services, as well as the rationale for telepsychological services (i.e., to reduce exposure risk to COVID-19). Nicole [name]  acknowledged understanding that appointments cannot be recorded without both party consent and she is aware she is responsible for securing confidentiality on her end of the session. Nicole [name] verbally consented to proceed.  Chief Complaint/HPI: Nicole [name] was referred by Nicole Bathe, FNP-C due to depression with emotional eating behaviors. Per the note for the visit with Nicole Bathe, FNP-C on October 24, 2019, "Nicole [name] struggles with emotional eating in the afternoons. She cannot tolerate bupropion due to tinnitus." The note for the initial appointment with Dr. Jearld [name] on September 26, 2019 indicated the following: "Her family eats meals together, she thinks her family will eat healthier with her, her desired weight loss is 51 pounds, she has been heavy most of her adult life, she started gaining weight in college, her heaviest weight ever was 218 pounds, she snacks frequently in the evenings, she occasionally skips meals, she frequently makes poor food choices, she sometimes eats larger portions than normal and she struggles with emotional eating." Nicole [name]'s Food and Mood (modified PHQ-9) score on September 26, 2019 was 16.  During today's appointment, Nicole [name] was verbally administered a questionnaire assessing various behaviors related to emotional eating. Nicole [name] endorsed the following: overeat when you are celebrating, experience food cravings on a regular basis, eat certain foods when you are anxious, stressed, depressed, or your feelings are hurt, use food to help you cope with  emotional situations, find food is comforting to you, overeat when you are angry or upset, overeat when you are worried about  something, overeat frequently when you are bored or lonely, overeat when you are alone, but eat much less when you are with other people and eat as a reward. She shared she craves "mostly sweets." [name] believes the onset of emotional eating was in childhood and described the current frequency of emotional eating as "maybe once a week." In addition, Nicole [name] endorsed a history of binge eating. She described engaging in binge eating "approximately once a year," noting the last time was "within the last three months." Nicole [name] denied a history of restricting food intake, purging and engagement in other compensatory strategies, and has never been diagnosed with an eating disorder. She also denied a history of treatment for emotional eating. Moreover, Nicole [name] indicated stress triggers emotional eating, whereas staying busy makes emotional eating better. Furthermore, Nicole [name] denied other problems of concern.    Mental Status Examination:  Appearance: well groomed and appropriate hygiene  Behavior: appropriate to circumstances Mood: euthymic Affect: mood congruent Speech: normal in rate, volume, and tone Eye Contact: appropriate Psychomotor Activity: appropriate Gait: unable to assess Thought Process: linear, logical, and goal directed  Thought Content/Perception: denies suicidal and homicidal ideation, plan, and intent and no hallucinations, delusions, bizarre thinking or behavior reported or observed Orientation: time, person, place and purpose of appointment Memory/Concentration: memory, attention, language, and fund of knowledge intact  Insight/Judgment: good  Family & Psychosocial History: Nicole [name] reported she is married and she has three children (ages 38, 64, and 67). 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 Additionally, Nicole [name] shared her highest level of education obtained  is a bachelor's degree. Currently, Nicole [name]'s social support system consists of her husband, parents (live in town), and church family. Moreover, Nicole [name] stated she resides with her husband and children.   Medical History:  Past Medical History:  Diagnosis Date  . Anxiety   . Back pain   . Depression   . GERD (gastroesophageal reflux disease)   . Hypothyroidism   . Palpitations   . Pre-diabetes   . Vitamin D deficiency    Past Surgical History:  Procedure Laterality Date  . MOUTH SURGERY     Current Outpatient Medications on File Prior to Visit  Medication Sig Dispense Refill  . b complex vitamins capsule Take 1 capsule by mouth daily.    . cholecalciferol (VITAMIN D3) 25 MCG (1000 UT) tablet Take 1,000 Units by mouth daily.    . famotidine (PEPCID) 20 MG tablet Take 20 mg by mouth 2 (two) times daily.    . fexofenadine (ALLEGRA) 180 MG tablet Take 180 mg by mouth daily.    Marland Kitchen ibuprofen (ADVIL) 200 MG tablet Take 200 mg by mouth every 6 (six) hours as needed.    Marland Kitchen levothyroxine (SYNTHROID) 50 MCG tablet TAKE 1 TABLET(50 MCG) BY MOUTH DAILY 90 tablet 3  . magnesium 30 MG tablet Take 30 mg by mouth 2 (two) times daily.    . mometasone (NASONEX) 50 MCG/ACT nasal spray Place 2 sprays into the nose daily.    . Multiple Vitamin (MULTIVITAMIN) tablet Take 1 tablet by mouth daily.    . Vitamin D, Ergocalciferol, (DRISDOL) 1.25 MG (50000 UNIT) CAPS capsule Take 1 capsule (50,000 Units total) by mouth every 7 (seven) days. 4 capsule 0   No current facility-administered medications on file prior to visit.  Carlisha denied a history of head injuries and loss of consciousness.   Mental Health History: Nicole [name] reported she initiated therapeutic services two years ago, adding she "wrapped up therapy" yesterday. The focus  of treatment was reducing symptoms of anxiety and depression. Mata reported there is no history of hospitalizations for psychiatric concerns. She reported she started meeting with a  psychiatrist around 2019, noting she was prescribed antidepressants. She last met with the psychiatrist in October 2020 and their follow-up appointment is in May 2021 for a check-in. Nicole [name] stated she is not currently prescribed any psychotropic medications. Nicole [name] endorsed a family history of mental health related concerns. She stated depression runs on both sides of her family, and anxiety runs on her father's side. Nicole [name] reported there is no history of trauma including psychological, physical  and sexual abuse, as well as neglect.   Nicole [name] described her typical mood lately as "stable, not super happy but pretty even keel right now." She believes changes in her mood are "hormonal." Aside from concerns noted above and endorsed on the PHQ-9 and GAD-7, Nicole [name] reported feeling self-conscious around 5th grade and started "comparing" herself to other girls in 6th grade due to weight. During college, she indicated she gained weight, adding she did not have a regular menstrual cycle. Since then, she described fluctuations in her weight. She indicated she continues to work towards coping with body image concerns. Nicole [name] denied current alcohol use. She denied tobacco use. She denied illicit/recreational substance use. Regarding caffeine intake, Nicole [name] reported consuming typically one cup of coffee daily. Furthermore, Nicole [name] indicated she is not experiencing the following: hopelessness, hallucinations and delusions, paranoia, symptoms of mania (e.g., expansive mood, flighty ideas, decreased need for sleep, engagement in risky behaviors), social withdrawal, crying spells, panic attacks and decreased motivation. She also denied history of and current suicidal ideation, plan, and intent; history of and current homicidal ideation, plan, and intent; and history of and current engagement in self-harm.  The following strengths were reported by Nicole [name]: creative, good at taking care of family, good at teaching, and good at listening. The  following strengths were observed by this provider: ability to express thoughts and feelings during the therapeutic session, ability to establish and benefit from a therapeutic relationship, willingness to work toward established goal(s) with the clinic and ability to engage in reciprocal conversation.  Legal History: Nicole [name] reported there is no history of legal involvement.   Structured Assessments Results: The Patient Health Questionnaire-9 (PHQ-9) is a self-report measure that assesses symptoms and severity of depression over the course of the last two weeks. Leina obtained a score of 1 suggesting minimal depression. Falon finds the endorsed symptoms to be somewhat difficult. [0= Not at all; 1= Several days; 2= More than half the days; 3= Nearly every day] Little interest or pleasure in doing things 0  Feeling down, depressed, or hopeless 0  Trouble falling or staying asleep, or sleeping too much 0  Feeling tired or having little energy 1  Poor appetite or overeating 0  Feeling bad about yourself --- or that you are a failure or have let yourself or your family down 0  Trouble concentrating on things, such as reading the newspaper or watching television 0  Moving or speaking so slowly that other people could have noticed? Or the opposite --- being so fidgety or restless that you have been moving around a lot more than usual 0  Thoughts that you would be better off dead or hurting yourself in some way 0  PHQ-9 Score 1    The Generalized Anxiety Disorder-7 (GAD-7) is a brief self-report measure that assesses symptoms of anxiety over the course of the last two weeks. Arpita obtained a  score of 1 suggesting minimal anxiety. Karisha finds the endorsed symptoms to be somewhat difficult. [0= Not at all; 1= Several days; 2= Over half the days; 3= Nearly every day] Feeling nervous, anxious, on edge 0  Not being able to stop or control worrying 0  Worrying too much about different things 0  Trouble  relaxing 0  Being so restless that it's hard to sit still 0  Becoming easily annoyed or irritable 1  Feeling afraid as if something awful might happen 0  GAD-7 Score 1   Interventions:  Conducted a chart review Focused on rapport building Verbally administered PHQ-9 and GAD-7 for symptom monitoring Verbally administered Food & Mood questionnaire to assess various behaviors related to emotional eating. Provided emphatic reflections and validation Collaborated with patient on a treatment goal  Psychoeducation provided regarding physical versus emotional hunger  Provisional DSM-5 Diagnosis: 311 (F32.8) Other Specified Depressive Disorder, Emotional Eating Behaviors  Plan: Kennedy appears able and willing to participate as evidenced by collaboration on a treatment goal, engagement in reciprocal conversation, and asking questions as needed for clarification. The next appointment will be scheduled in two weeks, which will be via [employer]. The following treatment goal was established: decrease emotional eating. This provider will regularly review the treatment plan and medical chart to keep informed of status changes. Jalaya expressed understanding and agreement with the initial treatment plan of care. Brunetta will be sent a handout via e-mail to utilize between now and the next appointment to increase awareness of hunger patterns and subsequent eating. Trana provided verbal consent during today's appointment for this provider to send the handout via e-mail.

## 2019-10-24 NOTE — Progress Notes (Signed)
Chief Complaint:   OBESITY Nicole [name] is here to discuss her progress with her obesity treatment plan along with follow-up of her obesity related diagnoses. Nicole [name] is on the Category 3 Plan and states she is following her eating plan approximately 50% of the time. Nicole [name] states she is walking for 30 minutes 1 time per week.  Today's visit was #: 3 Starting weight: 210 lbs Starting date: 09/26/2019 Today's weight: 207 lbs Today's date: 10/24/2019 Total lbs lost to date: 3 Total lbs lost since last in-office visit: 0  Interim History: Nicole [name] has struggled to eat all of the protein in on the plan. 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 When she stays on the plan her hunger is satisfied. She is deviating from the plan by adding walnuts, chia seeds, and using Ezekial bread.  Subjective:   1. Other depression, with emotional eating Nicole [name] struggles with emotional eating in the afternoons. She cannot tolerate bupropion due to tinnitus.  Assessment/Plan:   1. Other depression, with emotional eating Behavior modification techniques were discussed today to help Nicole [name] deal with her emotional/non-hunger eating behaviors. We will refer to Dr. Mallie Mussel, our Bariatric Psychologist for evaluation. Orders and follow up as documented in patient record.   2. Class 1 obesity with serious comorbidity and body mass index (BMI) of 34.0 to 34.9 in adult, unspecified obesity type Nicole [name] is currently in the action stage of change. As such, her goal is to continue with weight loss efforts. She has agreed to the Category 3 Plan or the Vegetarian Plan + 300 calories. We discussed protein exchanges for 2 oz of meat so she may eat 2 oz less of meat at meals and then add a protein snack to compensate.  Exercise goals: Nicole [name] is to continue her current exercise regimen as is.   Behavioral modification strategies: increasing lean protein intake, decreasing simple carbohydrates and meal  planning and cooking strategies.  Nicole [name] has agreed to follow-up with our clinic in 2 weeks. She was informed of the importance of frequent follow-up visits to maximize her success with intensive lifestyle modifications for her multiple health conditions.   Objective:   Blood pressure 126/74, pulse 82, temperature 98.6 F (37 C), temperature source Oral, height 5\' 5"  (1.651 m), weight 207 lb (93.9 kg), last menstrual period 09/26/2019, SpO2 98 %. Body mass index is 34.45 kg/m.  General: Cooperative, alert, well developed, in no acute distress. HEENT: Conjunctivae and lids unremarkable. Cardiovascular: Regular rhythm.  Lungs: Normal work of breathing. Neurologic: No focal deficits.   Lab Results  Component Value Date   CREATININE 0.59 09/26/2019   BUN 8 09/26/2019   NA 138 09/26/2019   K 4.2 09/26/2019   CL 101 09/26/2019   CO2 25 09/26/2019   Lab Results  Component Value Date   ALT 17 09/26/2019   AST 19 09/26/2019   ALKPHOS 68 09/26/2019   BILITOT 0.5 09/26/2019   Lab Results  Component Value Date   HGBA1C 6.1 (H) 09/26/2019   HGBA1C 6.0 (H) 09/21/2018   HGBA1C 6.0 (H) 05/31/2014   Lab Results  Component Value Date   INSULIN 14.8 09/26/2019   Lab Results  Component Value Date   TSH 1.640 08/25/2018   Lab Results  Component Value Date   CHOL 157 09/26/2019   HDL 38 (L) 09/26/2019   LDLCALC 90 09/26/2019   TRIG 170 (H) 09/26/2019   CHOLHDL 3.8 08/25/2018   Lab Results  Component Value Date   WBC 6.5 05/04/2019   HGB 13.0 05/04/2019   HCT 38.7 05/04/2019   MCV 91.5 05/04/2019   PLT 289 05/04/2019   No results found for: IRON, TIBC, FERRITIN  Attestation Statements:   Reviewed by clinician on day of visit: allergies, medications, problem list, medical history, surgical history, family history, social history, and previous encounter notes.   Trude Mcburney, am acting as Energy manager for [place], FNP-C.  I have reviewed the above  documentation for accuracy and completeness, and I agree with the above. - Jesse Sans, FNP

## 2019-10-25 ENCOUNTER — Encounter (INDEPENDENT_AMBULATORY_CARE_PROVIDER_SITE_OTHER): Payer: Self-pay | Admitting: Family Medicine

## 2019-10-30 DIAGNOSIS — F329 Major depressive disorder, single episode, unspecified: Secondary | ICD-10-CM | POA: Diagnosis not present

## 2019-10-31 ENCOUNTER — Ambulatory Visit (INDEPENDENT_AMBULATORY_CARE_PROVIDER_SITE_OTHER): Payer: BC Managed Care – PPO | Admitting: Psychology

## 2019-10-31 ENCOUNTER — Other Ambulatory Visit: Payer: Self-pay

## 2019-10-31 DIAGNOSIS — F3289 Other specified depressive episodes: Secondary | ICD-10-CM

## 2019-10-31 NOTE — Progress Notes (Signed)
  Office: [phone]  /  Fax: [phone]    Date: November 14, 2019   Appointment Start Time: 2:03pm Duration: 24 minutes Provider: Lawerance Cruel, Psy.D. Type of Session: Individual Therapy  Location of Patient: Home Location of Provider: Provider's Home Type of Contact: Telepsychological Visit via [employer] Web[employer]  Session Content: This provider called Nicole [name] at 2:02pm as she did not present for the WebEx appointment. This provider was unable to leave a voicemail as [employer] was full. She was observed joining shortly after. As such, today's appointment was initiated 3 minutes late. Nicole [name] is a 39 y.o. female presenting via [employer] Web[employer] for a follow-up appointment to address the previously established treatment goal of decreasing emotional eating. Today's appointment was a telepsychological visit due to COVID-19. Nicole [name] provided verbal consent for today's telepsychological appointment and she is aware she is responsible for securing confidentiality on her end of the session. Prior to proceeding with today's appointment, Nicole [name]'s physical location at the time of this appointment was obtained as well a phone number she could be reached at in the event of technical difficulties. Nicole [name] and this provider participated in today's telepsychological service.   This provider conducted a brief check-in. Nicole [name] shared about recent events, including recent deviations from the meal plan. Emotional and physical hunger was reviewed. Psychoeducation regarding triggers for emotional eating was provided. Nicole [name] was provided a handout, and encouraged to utilize the handout between now and the next appointment to increase awareness of triggers and frequency. Nicole [name] agreed. This provider also discussed behavioral strategies for specific triggers, such as placing the utensil down when conversing to avoid mindless eating. Nicole [name] provided verbal consent during today's appointment for this provider to send a handout about triggers  via e-mail. Nicole [name] was receptive to today's appointment as evidenced by openness to sharing, responsiveness to feedback, and willingness to explore triggers for emotional eating.  Mental Status Examination:  Appearance: well groomed and appropriate hygiene  Behavior: appropriate to circumstances Mood: euthymic Affect: mood congruent Speech: normal in rate, volume, and tone Eye Contact: appropriate Psychomotor Activity: appropriate Gait: unable to assess Thought Process: linear, logical, and goal directed  Thought Content/Perception: no hallucinations, delusions, bizarre thinking or behavior reported or observed and no evidence of suicidal and homicidal ideation, plan, and intent Orientation: time, person, place and purpose of appointment Memory/Concentration: memory, attention, language, and fund of knowledge intact  Insight/Judgment: good  Interventions:  Conducted a brief chart review Provided empathic reflections and validation Reviewed content from the previous session Employed supportive psychotherapy interventions to facilitate reduced distress and to improve coping skills with identified stressors Employed motivational interviewing skills to assess patient's willingness/desire to adhere to recommended medical treatments and assignments Psychoeducation provided regarding triggers for emotional eating  DSM-5 Diagnosis: 311 (F32.8) Other Specified Depressive Disorder, Emotional Eating Behaviors  Treatment Goal & Progress: During the initial appointment with this provider, the following treatment goal was established: decrease emotional eating. Nicole [name] has demonstrated progress in her goal as evidenced by increased awareness of hunger patterns.   Plan: The next appointment will be scheduled in two weeks, which will be via [employer]. The next session will focus on working towards the established treatment goal.

## 2019-11-05 DIAGNOSIS — R002 Palpitations: Secondary | ICD-10-CM | POA: Diagnosis not present

## 2019-11-05 DIAGNOSIS — Z6834 Body mass index (BMI) 34.0-34.9, adult: Secondary | ICD-10-CM | POA: Diagnosis not present

## 2019-11-05 DIAGNOSIS — E6609 Other obesity due to excess calories: Secondary | ICD-10-CM | POA: Diagnosis not present

## 2019-11-05 DIAGNOSIS — E039 Hypothyroidism, unspecified: Secondary | ICD-10-CM | POA: Diagnosis not present

## 2019-11-07 ENCOUNTER — Telehealth (INDEPENDENT_AMBULATORY_CARE_PROVIDER_SITE_OTHER): Payer: BC Managed Care – PPO | Admitting: Family Medicine

## 2019-11-07 ENCOUNTER — Other Ambulatory Visit: Payer: Self-pay

## 2019-11-07 ENCOUNTER — Encounter (INDEPENDENT_AMBULATORY_CARE_PROVIDER_SITE_OTHER): Payer: Self-pay | Admitting: Family Medicine

## 2019-11-07 DIAGNOSIS — Z6834 Body mass index (BMI) 34.0-34.9, adult: Secondary | ICD-10-CM

## 2019-11-07 DIAGNOSIS — E559 Vitamin D deficiency, unspecified: Secondary | ICD-10-CM

## 2019-11-07 DIAGNOSIS — F3289 Other specified depressive episodes: Secondary | ICD-10-CM

## 2019-11-07 DIAGNOSIS — E669 Obesity, unspecified: Secondary | ICD-10-CM

## 2019-11-07 MED ORDER — VITAMIN D (ERGOCALCIFEROL) 1.25 MG (50000 UNIT) PO CAPS: 50000.0000 [IU] | ORAL_CAPSULE | ORAL | 0 refills | Status: DC

## 2019-11-08 DIAGNOSIS — E559 Vitamin D deficiency, unspecified: Secondary | ICD-10-CM | POA: Insufficient documentation
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
## 2019-11-14 ENCOUNTER — Ambulatory Visit (INDEPENDENT_AMBULATORY_CARE_PROVIDER_SITE_OTHER): Payer: BC Managed Care – PPO | Admitting: Psychology

## 2019-11-14 ENCOUNTER — Other Ambulatory Visit: Payer: Self-pay

## 2019-11-14 DIAGNOSIS — F3289 Other specified depressive episodes: Secondary | ICD-10-CM

## 2019-11-14 NOTE — Progress Notes (Signed)
  Office: [phone]  /  Fax: [phone]    Date: November 28, 2019   Appointment Start Time: 2:32pm Duration: 28 minutes Provider: Lawerance Cruel, Psy.D. Type of Session: Individual Therapy  Location of Patient: Home Location of Provider: Provider's Home Type of Contact: Telepsychological Visit via [employer] Web[employer]  Session Content: Nicole [name] is a 39 y.o. female presenting via [employer] WebEx for a follow-up appointment to address the previously established treatment goal of decreasing emotional eating. Today's appointment was a telepsychological visit due to COVID-19. [name] provided verbal consent for today's telepsychological appointment and she is aware she is responsible for securing confidentiality on her end of the session. Prior to proceeding with today's appointment, [name]'s physical location at the time of this appointment was obtained as well a phone number she could be reached at in the event of technical difficulties. [name] and this provider participated in today's telepsychological service.   This provider conducted a brief check-in. [name] shared about recent events, including painting her home and her recent appointment with [facility], FNP-C. She stated she is regularly journaling this week. Triggers for emotional eating were reviewed and [name] noted a reduction in emotional eating. Moreover, psychoeducation regarding mindfulness was provided. A handout was provided to [name] with further information regarding mindfulness, including exercises. This provider also explained the benefit of mindfulness as it relates to emotional eating. [name] was encouraged to engage in the provided exercises between now and the next appointment with this provider. [name] agreed. During today's appointment, [name] was led through a mindfulness exercise involving her senses. [name] provided verbal consent during today's appointment for this provider to send a handout about mindfulness via e-mail. [name] was receptive to  today's appointment as evidenced by openness to sharing, responsiveness to feedback, and willingness to engage in mindfulness exercises to assist with coping.  Mental Status Examination:  Appearance: well groomed and appropriate hygiene  Behavior: appropriate to circumstances Mood: euthymic Affect: mood congruent Speech: normal in rate, volume, and tone Eye Contact: appropriate Psychomotor Activity: appropriate Gait: unable to assess Thought Process: linear, logical, and goal directed  Thought Content/Perception: no hallucinations, delusions, bizarre thinking or behavior reported or observed and no evidence of suicidal and homicidal ideation, plan, and intent Orientation: time, person, place and purpose of appointment Memory/Concentration: memory, attention, language, and fund of knowledge intact  Insight/Judgment: good  Interventions:  Conducted a brief chart review Provided empathic reflections and validation Reviewed content from the previous session Employed supportive psychotherapy interventions to facilitate reduced distress and to improve coping skills with identified stressors Employed motivational interviewing skills to assess patient's willingness/desire to adhere to recommended medical treatments and assignments Psychoeducation provided regarding mindfulness Engaged patient in mindfulness exercise(s) Employed acceptance and commitment interventions to emphasize mindfulness and acceptance without struggle  DSM-5 Diagnosis(es): 311 (F32.8) Other Specified Depressive Disorder, Emotional Eating Behaviors  Treatment Goal & Progress: During the initial appointment with this provider, the following treatment goal was established: decrease emotional eating. [name] has demonstrated progress in her goal as evidenced by increased awareness of hunger patterns, increased awareness of triggers for emotional eating and reduction in emotional eating. [name] also demonstrates willingness to  engage in mindfulness exercises.  Plan: The next appointment will be scheduled in two weeks, which will be via [employer]. The next session will focus on working towards the established treatment goal.

## 2019-11-16 DIAGNOSIS — R002 Palpitations: Secondary | ICD-10-CM | POA: Diagnosis not present

## 2019-11-19 DIAGNOSIS — R002 Palpitations: Secondary | ICD-10-CM | POA: Insufficient documentation

## 2019-11-21 ENCOUNTER — Other Ambulatory Visit (INDEPENDENT_AMBULATORY_CARE_PROVIDER_SITE_OTHER): Payer: Self-pay | Admitting: Family Medicine

## 2019-11-21 ENCOUNTER — Other Ambulatory Visit: Payer: Self-pay

## 2019-11-21 ENCOUNTER — Encounter (INDEPENDENT_AMBULATORY_CARE_PROVIDER_SITE_OTHER): Payer: Self-pay | Admitting: Family Medicine

## 2019-11-21 ENCOUNTER — Ambulatory Visit (INDEPENDENT_AMBULATORY_CARE_PROVIDER_SITE_OTHER): Payer: BC Managed Care – PPO | Admitting: Family Medicine

## 2019-11-21 VITALS — BP 127/76 | HR 78 | Temp 98.1°F | Ht 65.0 in | Wt 208.0 lb

## 2019-11-21 DIAGNOSIS — R7303 Prediabetes: Secondary | ICD-10-CM

## 2019-11-21 DIAGNOSIS — E669 Obesity, unspecified: Secondary | ICD-10-CM

## 2019-11-21 DIAGNOSIS — E559 Vitamin D deficiency, unspecified: Secondary | ICD-10-CM | POA: Diagnosis not present

## 2019-11-21 DIAGNOSIS — Z9189 Other specified personal risk factors, not elsewhere classified: Secondary | ICD-10-CM | POA: Diagnosis not present

## 2019-11-21 DIAGNOSIS — Z6834 Body mass index (BMI) 34.0-34.9, adult: Secondary | ICD-10-CM

## 2019-11-21 MED ORDER — VITAMIN D (ERGOCALCIFEROL) 1.25 MG (50000 UNIT) PO CAPS: 50000.0000 [IU] | ORAL_CAPSULE | ORAL | 0 refills | Status: DC

## 2019-11-21 MED ORDER — METFORMIN HCL 500 MG PO TABS: 500.0000 mg | ORAL_TABLET | Freq: Every day | ORAL | 0 refills | Status: DC

## 2019-11-22 DIAGNOSIS — Z23 Encounter for immunization: Secondary | ICD-10-CM | POA: Diagnosis not present

## 2019-11-22 NOTE — Progress Notes (Signed)
Chief Complaint:   OBESITY Nicole [name] is here to discuss her progress with her obesity treatment plan along with follow-up of her obesity related diagnoses. Nicole [name] is on the Category 3 Plan and states she is following her eating plan approximately 30% of the time. Nicole [name] states she is walking 30 minutes 2 times per week.  Today's visit was #: 5 Starting weight: 210 lbs Starting date: 09/26/2019 Today's weight: 208 lbs Today's date: 11/21/2019 Total lbs lost to date: 2 Total lbs lost since last in-office visit: 0  Interim History: Nicole [name] is able to adhere to plan at breakfast and lunch and then deviates from plan in the afternoon and later. She does struggle with wanting to snack. 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 WE discussed journaling at last visit but she has not done much of this. She notes that journaling is probably the best plan for her.   Subjective:   Prediabetes. Nicole [name] has a diagnosis of prediabetes based on her elevated HgA1c and was informed this puts her at greater risk of developing diabetes. She continues to work on diet and exercise to decrease her risk of diabetes. She denies nausea or hypoglycemia. Ethlyn reports increased polyphagia with breakfast and reports cravings.  Lab Results  Component Value Date   HGBA1C 6.1 (H) 09/26/2019   Lab Results  Component Value Date   INSULIN 14.8 09/26/2019   Vitamin D deficiency. Last Vitamin D level was not at goal - 36.7 on 09/26/2019.  At risk for diabetes mellitus. Nicole [name] is at higher than average risk for developing diabetes due to her obesity.   Assessment/Plan:   Prediabetes. Cherysh will continue to work on weight loss, exercise, and decreasing simple carbohydrates to help decrease the risk of diabetes. She was given a refill on her metFORMIN (GLUCOPHAGE) 500 MG tablet QAM with meal #30 with 0 refills.  Vitamin D deficiency. Low Vitamin D level  contributes to fatigue and are associated with obesity, breast, and colon cancer. She was given a refill on her Vitamin D, Ergocalciferol, (DRISDOL) 1.25 MG (50000 UNIT) CAPS capsule every week #4 with 0 refills and will follow-up for routine testing of Vitamin D, at least 2-3 times per year to avoid over-replacement.   At risk for diabetes mellitus. Nicole [name] was given approximately 15 minutes of diabetes education and counseling today. We discussed intensive lifestyle modifications today with an emphasis on weight loss as well as increasing exercise and decreasing simple carbohydrates in her diet. We also reviewed medication options with an emphasis on risk versus benefit of those discussed.   Repetitive spaced learning was employed today to elicit superior memory formation and behavioral change.  Class 1 obesity with serious comorbidity and body mass index (BMI) of 34.0 to 34.9 in adult, unspecified obesity type.  Nicole [name] is currently in the action stage of change. As such, her goal is to continue with weight loss efforts. She has agreed to keeping a food journal and adhering to recommended goals of 1500-1600 calories and 85 grams of protein daily. She will journal at least 5 out of 7 days.  Handout was given on Recipes.  Exercise goals: Nicole [name] will continue her current exercise regimen.  Behavioral modification strategies: decreasing simple carbohydrates, planning for success and keeping a strict food journal.  [name] has agreed to follow-up with our clinic in 2 weeks. She was informed of the importance of frequent follow-up visits  to maximize her success with intensive lifestyle modifications for her multiple health conditions.   Objective:   Blood pressure 127/76, pulse 78, temperature 98.1 F (36.7 C), temperature source Oral, height 5\' 5"  (1.651 m), weight 208 lb (94.3 kg), last menstrual period 10/25/2019, SpO2 99 %. Body mass index is 34.61 kg/m.  General: Cooperative, alert, well  developed, in no acute distress. HEENT: Conjunctivae and lids unremarkable. Cardiovascular: Regular rhythm.  Lungs: Normal work of breathing. Neurologic: No focal deficits.   Lab Results  Component Value Date   CREATININE 0.59 09/26/2019   BUN 8 09/26/2019   NA 138 09/26/2019   K 4.2 09/26/2019   CL 101 09/26/2019   CO2 25 09/26/2019   Lab Results  Component Value Date   ALT 17 09/26/2019   AST 19 09/26/2019   ALKPHOS 68 09/26/2019   BILITOT 0.5 09/26/2019   Lab Results  Component Value Date   HGBA1C 6.1 (H) 09/26/2019   HGBA1C 6.0 (H) 09/21/2018   HGBA1C 6.0 (H) 05/31/2014   Lab Results  Component Value Date   INSULIN 14.8 09/26/2019   Lab Results  Component Value Date   TSH 1.640 08/25/2018   Lab Results  Component Value Date   CHOL 157 09/26/2019   HDL 38 (L) 09/26/2019   LDLCALC 90 09/26/2019   TRIG 170 (H) 09/26/2019   CHOLHDL 3.8 08/25/2018   Lab Results  Component Value Date   WBC 6.5 05/04/2019   HGB 13.0 05/04/2019   HCT 38.7 05/04/2019   MCV 91.5 05/04/2019   PLT 289 05/04/2019   No results found for: IRON, TIBC, FERRITIN  Attestation Statements:   Reviewed by clinician on day of visit: allergies, medications, problem list, medical history, surgical history, family history, social history, and previous encounter notes.  IMichaelene Song, am acting as Location manager for [employer], FNP   I have reviewed the above documentation for accuracy and completeness, and I agree with the above. -  Georgianne Fick, FNP

## 2019-11-28 ENCOUNTER — Ambulatory Visit (INDEPENDENT_AMBULATORY_CARE_PROVIDER_SITE_OTHER): Payer: BC Managed Care – PPO | Admitting: Psychology

## 2019-11-28 ENCOUNTER — Other Ambulatory Visit: Payer: Self-pay

## 2019-11-28 DIAGNOSIS — F3289 Other specified depressive episodes: Secondary | ICD-10-CM

## 2019-11-28 NOTE — Progress Notes (Signed)
Office: [phone]  /  Fax: [phone]    Date: December 11, 2019   Appointment Start Time: 8:35am Duration: 26 minutes Provider: Glennie Isle, Psy.D. Type of Session: Individual Therapy  Location of Patient: Home Location of Provider: Provider's Home Type of Contact: Telepsychological Visit via Telephone Call  Session Content: This provider called Nicole [name] at 8:32am as she did not present for the WebEx appointment. The e-mail with the secure link was re-sent. Due to difficulty connecting via Nicole [name] provided verbal consent to proceed via telephone call. As such, today's appointment was initiated 5 minutes late. Nicole [name] is a 39 y.o. female presenting via Telephone Call for a follow-up appointment to address the previously established treatment goal of decreasing emotional eating. Today's appointment was a telepsychological visit due to COVID-19. Nicole [name] provided verbal consent for today's telepsychological appointment and she is aware she is responsible for securing confidentiality on her end of the session. Prior to proceeding with today's appointment, Nicole [name]'s physical location at the time of this appointment was obtained as well a phone number she could be reached at in the event of technical difficulties. Nicole [name] and this provider participated in today's telepsychological service.   This provider conducted a brief check-in. Nicole [name] shared, "Things have been good." She described metformin as helpful and reported she is journaling regularly. Nicole [name] reported a reduction in emotional eating; however, she reported feeling not optimistic that her progress will continue based on her past experiences with weight loss as she recognized mindlessly eating in the past week. As such, session focused further on mindfulness to assist with coping. Nicole [name] was led through a mindfulness exercise (A Taste of Mindfulness) and her experience was processed. Nicole [name] provided verbal consent during today's appointment for this  provider to send the handout for today's exercise via e-mail. This provider also discussed the utilization of YouTube for mindfulness exercises (e.g., exercises by Nicole [name]). Furthermore, termination planning was discussed. Nicole [name] was receptive to a follow-up appointment in 3-4 weeks and an additional follow-up/termination appointment in 3-4 weeks after that. Overall, Nicole [name] was receptive to today's appointment as evidenced by openness to sharing, responsiveness to feedback, and willingness to continue engaging in mindfulness exercises.  Mental Status Examination:  Appearance: unable to assess  Behavior: unable to assess Mood: euthymic Affect: unable to fully assess Speech: normal in rate, volume, and tone Eye Contact: unable to assess Psychomotor Activity: unable to assess  Gait: unable to assess Thought Process: linear, logical, and goal directed  Thought Content/Perception: no hallucinations, delusions, bizarre thinking or behavior reported or observed and no evidence of suicidal and homicidal ideation, plan, and intent Orientation: time, person, place and purpose of appointment Memory/Concentration: memory, attention, language, and fund of knowledge intact  Insight/Judgment: good  Interventions:  Conducted a brief chart review Provided empathic reflections and validation Reviewed content from the previous session Employed supportive psychotherapy interventions to facilitate reduced distress and to improve coping skills with identified stressors Employed motivational interviewing skills to assess patient's willingness/desire to adhere to recommended medical treatments and assignments Engaged patient in mindfulness exercise(s) Employed acceptance and commitment interventions to emphasize mindfulness and acceptance without struggle Discussed termination planning  DSM-5 Diagnosis(es): 311 (F32.8) Other Specified Depressive Disorder, Emotional Eating Behaviors  Treatment Goal &  Progress: During the initial appointment with this provider, the following treatment goal was established: decrease emotional eating. Nicole [name] has demonstrated progress in her goal as evidenced by increased awareness of hunger patterns, increased awareness of triggers for emotional eating and reduction in emotional eating. Shelia also  continues to demonstrate willingness to engage in learned skill(s).  Plan: The next appointment will be scheduled in three weeks, which will be via MyChart Video Visit. The next session will focus on working towards the established treatment goal.

## 2019-12-03 DIAGNOSIS — E039 Hypothyroidism, unspecified: Secondary | ICD-10-CM | POA: Diagnosis not present

## 2019-12-03 DIAGNOSIS — E6609 Other obesity due to excess calories: Secondary | ICD-10-CM | POA: Diagnosis not present

## 2019-12-03 DIAGNOSIS — R002 Palpitations: Secondary | ICD-10-CM | POA: Diagnosis not present

## 2019-12-03 DIAGNOSIS — R0789 Other chest pain: Secondary | ICD-10-CM | POA: Diagnosis not present

## 2019-12-05 ENCOUNTER — Other Ambulatory Visit: Payer: Self-pay

## 2019-12-05 ENCOUNTER — Ambulatory Visit (INDEPENDENT_AMBULATORY_CARE_PROVIDER_SITE_OTHER): Payer: BC Managed Care – PPO | Admitting: Family Medicine

## 2019-12-05 ENCOUNTER — Encounter (INDEPENDENT_AMBULATORY_CARE_PROVIDER_SITE_OTHER): Payer: Self-pay | Admitting: Family Medicine

## 2019-12-05 ENCOUNTER — Other Ambulatory Visit (INDEPENDENT_AMBULATORY_CARE_PROVIDER_SITE_OTHER): Payer: Self-pay | Admitting: Family Medicine

## 2019-12-05 VITALS — BP 121/78 | HR 80 | Temp 98.5°F | Ht 65.0 in | Wt 205.0 lb

## 2019-12-05 DIAGNOSIS — E669 Obesity, unspecified: Secondary | ICD-10-CM

## 2019-12-05 DIAGNOSIS — E559 Vitamin D deficiency, unspecified: Secondary | ICD-10-CM

## 2019-12-05 DIAGNOSIS — Z9189 Other specified personal risk factors, not elsewhere classified: Secondary | ICD-10-CM

## 2019-12-05 DIAGNOSIS — R7303 Prediabetes: Secondary | ICD-10-CM | POA: Diagnosis not present

## 2019-12-05 DIAGNOSIS — Z6834 Body mass index (BMI) 34.0-34.9, adult: Secondary | ICD-10-CM

## 2019-12-05 MED ORDER — VITAMIN D (ERGOCALCIFEROL) 1.25 MG (50000 UNIT) PO CAPS: 50000.0000 [IU] | ORAL_CAPSULE | ORAL | 0 refills | Status: DC

## 2019-12-05 MED ORDER — METFORMIN HCL 500 MG PO TABS: 500.0000 mg | ORAL_TABLET | Freq: Two times a day (BID) | ORAL | 0 refills | Status: DC

## 2019-12-06 NOTE — Progress Notes (Signed)
Chief Complaint:   OBESITY Nicole [name] is here to discuss her progress with her obesity treatment plan along with follow-up of her obesity related diagnoses. Nicole [name] is on keeping a food journal and adhering to recommended goals of 1500-1600 calories and 85 grams of protein daily and states she is following her eating plan approximately 80% of the time. Nicole [name] states she is walking for 30 minutes 2 times per week.  Today's visit was #: 6 Starting weight: 210 lbs Starting date: 09/26/2019 Today's weight: 205 lbs Today's date: 12/05/2019 Total lbs lost to date: 5 Total lbs lost since last in-office visit: 3  Interim History: Nicole [name] has been journaling 5 days out of 7 days as directed at the last visit. She does not always meat protein goal. She feels that not having to journal everyday of the week has helped her to stick to the plan better.  Subjective:   1. Pre-diabetes Nicole [name] feels metformin helps with polyphagia. She still reports some polyphagia. Lab Results  Component Value Date   HGBA1C 6.1 (H) 09/26/2019    2. Vitamin D deficiency Nicole [name]'s Vit D level is not at goal. Last Vit D level was low at 36.7. She is on prescription Vit D.  3. At risk for osteoporosis Nicole [name] is at higher risk of osteopenia and osteoporosis due to Vitamin D deficiency.   Assessment/Plan:   1. Pre-diabetes Nicole [name] will continue to work on weight loss, exercise, and decreasing simple carbohydrates to help decrease the risk of diabetes. Nicole [name] agreed to increase metformin to 500 mg BID with no refills.  - metFORMIN (GLUCOPHAGE) 500 MG tablet; Take 1 tablet (500 mg total) by mouth 2 (two) times daily with a meal.  Dispense: 60 tablet; Refill: 0  2. Vitamin D deficiency Low Vitamin D level contributes to fatigue and are associated with obesity, breast, and colon cancer. We will refill prescription Vitamin D for 1 month. Nicole [name] will follow-up for routine testing of Vitamin D, at least 2-3 times per year to avoid  over-replacement.  - Vitamin D, Ergocalciferol, (DRISDOL) 1.25 MG (50000 UNIT) CAPS capsule; Take 1 capsule (50,000 Units total) by mouth every 7 (seven) days.  Dispense: 4 capsule; Refill: 0  3. At risk for osteoporosis Nicole [name] was given approximately 15 minutes of osteoporosis prevention counseling today. Nicole [name] is at risk for osteopenia and osteoporosis due to her Vitamin D deficiency. She was encouraged to take her Vitamin D and follow her higher calcium diet and increase strengthening exercise to help strengthen her bones and decrease her risk of osteopenia and osteoporosis.  Repetitive spaced learning was employed today to elicit superior memory formation and behavioral change.  4. Class 1 obesity with serious comorbidity and body mass index (BMI) of 34.0 to 34.9 in adult, unspecified obesity type Nicole [name] is currently in the action stage of change. As such, her goal is to continue with weight loss efforts. She has agreed to keeping a food journal and adhering to recommended goals of 1500-1600 calories and 85 grams of protein daily.   Nicole [name] is to continue to journal 5 days out of 7 days per week.  Exercise goals: As is.  Behavioral modification strategies: better snacking choices, planning for success and keeping a strict food journal.  Nicole [name] has agreed to follow-up with our clinic in 2 weeks. She was informed of the importance of frequent follow-up visits to maximize her success with intensive lifestyle modifications for her multiple health conditions.   Objective:   Blood pressure 121/78, pulse  80, temperature 98.5 F (36.9 C), temperature source Oral, height 5\' 5"  (1.651 m), weight 205 lb (93 kg), last menstrual period 11/28/2019, SpO2 97 %. Body mass index is 34.11 kg/m.  General: Cooperative, alert, well developed, in no acute distress. HEENT: Conjunctivae and lids unremarkable. Cardiovascular: Regular rhythm.  Lungs: Normal work of breathing. Neurologic: No focal deficits.    Lab Results  Component Value Date   CREATININE 0.59 09/26/2019   BUN 8 09/26/2019   NA 138 09/26/2019   K 4.2 09/26/2019   CL 101 09/26/2019   CO2 25 09/26/2019   Lab Results  Component Value Date   ALT 17 09/26/2019   AST 19 09/26/2019   ALKPHOS 68 09/26/2019   BILITOT 0.5 09/26/2019   Lab Results  Component Value Date   HGBA1C 6.1 (H) 09/26/2019   HGBA1C 6.0 (H) 09/21/2018   HGBA1C 6.0 (H) 05/31/2014   Lab Results  Component Value Date   INSULIN 14.8 09/26/2019   Lab Results  Component Value Date   TSH 1.640 08/25/2018   Lab Results  Component Value Date   CHOL 157 09/26/2019   HDL 38 (L) 09/26/2019   LDLCALC 90 09/26/2019   TRIG 170 (H) 09/26/2019   CHOLHDL 3.8 08/25/2018   Lab Results  Component Value Date   WBC 6.5 05/04/2019   HGB 13.0 05/04/2019   HCT 38.7 05/04/2019   MCV 91.5 05/04/2019   PLT 289 05/04/2019   No results found for: IRON, TIBC, FERRITIN  Attestation Statements:   Reviewed by clinician on day of visit: allergies, medications, problem list, medical history, surgical history, family history, social history, and previous encounter notes.   Wilhemena Durie, am acting as Location manager for [employer], FNP-C.  I have reviewed the above documentation for accuracy and completeness, and I agree with the above. -  Georgianne Fick, FNP

## 2019-12-11 ENCOUNTER — Ambulatory Visit (INDEPENDENT_AMBULATORY_CARE_PROVIDER_SITE_OTHER): Payer: BC Managed Care – PPO | Admitting: Psychology

## 2019-12-11 ENCOUNTER — Other Ambulatory Visit: Payer: Self-pay

## 2019-12-11 DIAGNOSIS — F3289 Other specified depressive episodes: Secondary | ICD-10-CM

## 2019-12-13 DIAGNOSIS — R002 Palpitations: Secondary | ICD-10-CM | POA: Diagnosis not present

## 2019-12-13 DIAGNOSIS — R0789 Other chest pain: Secondary | ICD-10-CM | POA: Diagnosis not present

## 2019-12-18 NOTE — Progress Notes (Signed)
Office: [phone]  /  Fax: [phone]    Date: January 01, 2020   Appointment Start Time: 8:30am Duration: 25 minutes Provider: Lawerance Cruel, Psy.D. Type of Session: Individual Therapy  Location of Patient: Home Location of Provider: Provider's Home Type of Contact: Telepsychological Visit via MyChart Video Visit  Session Content: Nicole [name] is a 39 y.o. female presenting via MyChart Video Visit for a follow-up appointment to address the previously established treatment goal of decreasing emotional eating. Today's appointment was a telepsychological visit due to COVID-19. Nicole [name] provided verbal consent for today's telepsychological appointment and she is aware she is responsible for securing confidentiality on her end of the session. Prior to proceeding with today's appointment, Nicole [name]'s physical location at the time of this appointment was obtained as well a phone number she could be reached at in the event of technical difficulties. Nicole [name] and this provider participated in today's telepsychological service.   Chapel acknowledged understanding that for today's appointment and future telepsychological appointments, MyChart will be utilized. She also verbally acknowledged understanding that the information outlined in the telepsychological informed consent form signed at the onset of treatment would still be applicable despite the change in the videoconferencing platform.   This provider conducted a brief check-in. Nicole [name] shared about recent events. Regarding eating, she noted, "It's been a little up and down." Nicole [name] acknowledged engaging in "comfort eating" before her husband went out of town and while he was away. Compared to the past, Nicole [name] noted a reduction in emotional eating in response to him to being away. Positive reinforcement was provided. Additionally, Nicole [name] described anticipatory anxiety about her husband's upcoming trip and expressed a belief it may be more challenging as it relates to  eating. This was further explored and she was engaged in problem solving to help her prepare for her husband's upcoming trip. She was receptive as evidenced by her stating, "It does make me feel better." This provider also discussed engaging in the 5-4-3-2-1 exercise previously shared as well as other mindfulness exercises to assist with coping with anticipatory anxiety and to help reduce comfort eating. Overall, Nicole [name] was receptive to today's appointment as evidenced by openness to sharing, responsiveness to feedback, and willingness to implement discussed strategies .  Mental Status Examination:  Appearance: well groomed and appropriate hygiene  Behavior: appropriate to circumstances Mood: euthymic Affect: mood congruent Speech: normal in rate, volume, and tone Eye Contact: appropriate Psychomotor Activity: appropriate Gait: unable to assess Thought Process: linear, logical, and goal directed  Thought Content/Perception: no hallucinations, delusions, bizarre thinking or behavior reported or observed and no evidence of suicidal and homicidal ideation, plan, and intent Orientation: time, person, place and purpose of appointment Memory/Concentration: memory, attention, language, and fund of knowledge intact  Insight/Judgment: good  Interventions:  Conducted a brief chart review Provided empathic reflections and validation Employed supportive psychotherapy interventions to facilitate reduced distress and to improve coping skills with identified stressors Employed motivational interviewing skills to assess patient's willingness/desire to adhere to recommended medical treatments and assignments Engaged patient in problem solving  Positive reinforcement provided  DSM-5 Diagnosis(es): 311 (F32.8) Other Specified Depressive Disorder, Emotional Eating Behaviors  Treatment Goal & Progress: During the initial appointment with this provider, the following treatment goal was established: decrease  emotional eating. Nicole [name] has demonstrated progress in her goal as evidenced by increased awareness of hunger patterns, increased awareness of triggers for emotional eating and reduction in emotional eating. Nicole [name] also continues to demonstrate willingness to engage in learned skill(s).  Plan: The next appointment  will be scheduled in one month, which will be via MyChart Video Visit. The next session will focus on termination.

## 2019-12-20 ENCOUNTER — Ambulatory Visit (INDEPENDENT_AMBULATORY_CARE_PROVIDER_SITE_OTHER): Payer: BC Managed Care – PPO | Admitting: Family Medicine

## 2019-12-22 DIAGNOSIS — Z23 Encounter for immunization: Secondary | ICD-10-CM | POA: Diagnosis not present

## 2019-12-27 ENCOUNTER — Other Ambulatory Visit: Payer: Self-pay

## 2019-12-27 ENCOUNTER — Encounter (INDEPENDENT_AMBULATORY_CARE_PROVIDER_SITE_OTHER): Payer: Self-pay | Admitting: Bariatrics

## 2019-12-27 ENCOUNTER — Ambulatory Visit (INDEPENDENT_AMBULATORY_CARE_PROVIDER_SITE_OTHER): Payer: BC Managed Care – PPO | Admitting: Bariatrics

## 2019-12-27 VITALS — BP 121/70 | HR 76 | Temp 98.3°F | Ht 65.0 in | Wt 206.0 lb

## 2019-12-27 DIAGNOSIS — E669 Obesity, unspecified: Secondary | ICD-10-CM

## 2019-12-27 DIAGNOSIS — R002 Palpitations: Secondary | ICD-10-CM | POA: Diagnosis not present

## 2019-12-27 DIAGNOSIS — E559 Vitamin D deficiency, unspecified: Secondary | ICD-10-CM | POA: Diagnosis not present

## 2019-12-27 DIAGNOSIS — Z9189 Other specified personal risk factors, not elsewhere classified: Secondary | ICD-10-CM | POA: Diagnosis not present

## 2019-12-27 DIAGNOSIS — E8881 Metabolic syndrome: Secondary | ICD-10-CM

## 2019-12-27 DIAGNOSIS — Z6834 Body mass index (BMI) 34.0-34.9, adult: Secondary | ICD-10-CM

## 2019-12-28 LAB — VITAMIN D 25 HYDROXY (VIT D DEFICIENCY, FRACTURES): Vit D, 25-Hydroxy: 51.8 ng/mL (ref 30.0–100.0)

## 2019-12-28 LAB — COMPREHENSIVE METABOLIC PANEL
ALT: 20 IU/L (ref 0–32)
AST: 18 IU/L (ref 0–40)
Albumin/Globulin Ratio: 1.6 (ref 1.2–2.2)
Albumin: 4.1 g/dL (ref 3.8–4.8)
Alkaline Phosphatase: 64 IU/L (ref 39–117)
BUN/Creatinine Ratio: 14 (ref 9–23)
BUN: 8 mg/dL (ref 6–24)
Bilirubin Total: 0.4 mg/dL (ref 0.0–1.2)
CO2: 23 mmol/L (ref 20–29)
Calcium: 9 mg/dL (ref 8.7–10.2)
Chloride: 101 mmol/L (ref 96–106)
Creatinine, Ser: 0.59 mg/dL (ref 0.57–1.00)
GFR calc Af Amer: 133 mL/min/{1.73_m2} (ref >59)
GFR calc non Af Amer: 115 mL/min/{1.73_m2} (ref >59)
Globulin, Total: 2.5 g/dL (ref 1.5–4.5)
Glucose: 123 mg/dL — ABNORMAL HIGH (ref 65–99)
Potassium: 4.3 mmol/L (ref 3.5–5.2)
Sodium: 139 mmol/L (ref 134–144)
Total Protein: 6.6 g/dL (ref 6.0–8.5)

## 2019-12-28 LAB — INSULIN, RANDOM: INSULIN: 14.7 u[IU]/mL (ref 2.6–24.9)

## 2019-12-28 LAB — HEMOGLOBIN A1C
Est. average glucose Bld gHb Est-mCnc: 120 mg/dL
Hgb A1c MFr Bld: 5.8 % — ABNORMAL HIGH (ref 4.8–5.6)

## 2019-12-28 LAB — LIPID PANEL WITH LDL/HDL RATIO
Cholesterol, Total: 160 mg/dL (ref 100–199)
HDL: 38 mg/dL — ABNORMAL LOW (ref >39)
LDL Chol Calc (NIH): 94 mg/dL (ref 0–99)
LDL/HDL Ratio: 2.5 ratio (ref 0.0–3.2)
Triglycerides: 159 mg/dL — ABNORMAL HIGH (ref 0–149)
VLDL Cholesterol Cal: 28 mg/dL (ref 5–40)

## 2019-12-28 LAB — T3: T3, Total: 128 ng/dL (ref 71–180)

## 2019-12-28 LAB — TSH: TSH: 1.53 u[IU]/mL (ref 0.450–4.500)

## 2019-12-28 LAB — T4, FREE: Free T4: 1.26 ng/dL (ref 0.82–1.77)

## 2019-12-31 ENCOUNTER — Encounter (INDEPENDENT_AMBULATORY_CARE_PROVIDER_SITE_OTHER): Payer: Self-pay | Admitting: Bariatrics

## 2019-12-31 NOTE — Progress Notes (Signed)
Chief Complaint:   OBESITY Nicole [name] is here to discuss her progress with her obesity treatment plan along with follow-up of her obesity related diagnoses. Nicole [name] is keeping a food journal and adhering to recommended goals of 1500-1600 calories and 85 grams of protein and states she is following her eating plan approximately 30% of the time. Nicole [name] states she is walking 20-30 minutes 3 times per week.  Today's visit was #: 7 Starting weight: 210 lbs Starting date: 09/26/2019 Today's weight: 206 lbs Today's date: 12/27/2019 Total lbs lost to date: 4 Total lbs lost since last in-office visit: 0  Interim History: Nicole [name] is up 1 lb today. Her husband just got back from a trip and notes comfort eating while he was away. She notes inadequate water intake.  Subjective:   Vitamin D deficiency. Nicole [name] is on high dose Vitamin D 50,000 IU weekly. Last Vitamin D 36.7 on 09/26/2019.  Insulin resistance. Nicole [name] has a diagnosis of insulin resistance based on her elevated fasting insulin level >5. She continues to work on diet and exercise to decrease her risk of diabetes. She is on metformin. A1c 6.1 on 09/26/2019 with an insulin of 14.8.  Lab Results  Component Value Date   INSULIN 14.8 09/26/2019   At risk for dehydration. Nicole [name] is at risk of dehydration secondary to not drinking enough water.  Assessment/Plan:   Vitamin D deficiency. Low Vitamin D level contributes to fatigue and are associated with obesity, breast, and colon cancer. She agrees to continue to take Vitamin D and VITAMIN D 25 Hydroxy (Vit-D Deficiency, Fractures) level was ordered today.   Insulin resistance. Nicole [name] will continue to work on weight loss, exercise, and decreasing simple carbohydrates to help decrease the risk of diabetes. Nicole [name] agreed to follow-up with [place] as directed to closely monitor her progress. Comprehensive metabolic panel, Hemoglobin A1c, Insulin, random labs ordered today.  At risk for dehydration. Nicole [name]  was given approximately 15 minutes dehydration prevention counseling today. Nicole [name] is at risk for dehydration due to weight loss and current medication(s). She was encouraged to hydrate and monitor fluid status to avoid dehydration as well as weight loss plateaus.   Class 1 obesity with serious comorbidity and body mass index (BMI) of 34.0 to 34.9 in adult, unspecified obesity type.  Nicole [name] is currently in the action stage of change. As such, her goal is to continue with weight loss efforts. She has agreed to keeping a food journal and adhering to recommended goals of 1500-1600 calories and 85 grams of protein daily.   She will work on meal planning.  Exercise goals: Nicole [name] will continue her current exercise regimen.  Behavioral modification strategies: increasing lean protein intake, decreasing simple carbohydrates, increasing vegetables, increasing water intake and meal planning and cooking strategies.  Nicole [name] has agreed to follow-up with our clinic in 4 weeks. She was informed of the importance of frequent follow-up visits to maximize her success with intensive lifestyle modifications for her multiple health conditions.   Nicole [name] was informed we would discuss her lab results at her next visit unless there is a critical issue that needs to be addressed sooner. Nicole [name] agreed to keep her next visit at the agreed upon time to discuss these results.  Objective:   Blood pressure 121/70, pulse 76, temperature 98.3 F (36.8 C), height 5\' 5"  (1.651 m), weight 206 lb (93.4 kg), last menstrual period 11/28/2019, SpO2 98 %. Body mass index is 34.28 kg/m.  General: Cooperative, alert, well developed, in no acute  distress. HEENT: Conjunctivae and lids unremarkable. Cardiovascular: Regular rhythm.  Lungs: Normal work of breathing. Neurologic: No focal deficits.   Lab Results  Component Value Date   CREATININE 0.59 12/27/2019   BUN 8 12/27/2019   NA 139 12/27/2019   K 4.3 12/27/2019   CL 101  12/27/2019   CO2 23 12/27/2019   Lab Results  Component Value Date   ALT 20 12/27/2019   AST 18 12/27/2019   ALKPHOS 64 12/27/2019   BILITOT 0.4 12/27/2019   Lab Results  Component Value Date   HGBA1C 5.8 (H) 12/27/2019   HGBA1C 6.1 (H) 09/26/2019   HGBA1C 6.0 (H) 09/21/2018   HGBA1C 6.0 (H) 05/31/2014   Lab Results  Component Value Date   INSULIN 14.7 12/27/2019   INSULIN 14.8 09/26/2019   Lab Results  Component Value Date   TSH 1.530 12/27/2019   Lab Results  Component Value Date   CHOL 160 12/27/2019   HDL 38 (L) 12/27/2019   LDLCALC 94 12/27/2019   TRIG 159 (H) 12/27/2019   CHOLHDL 3.8 08/25/2018   Lab Results  Component Value Date   WBC 6.5 05/04/2019   HGB 13.0 05/04/2019   HCT 38.7 05/04/2019   MCV 91.5 05/04/2019   PLT 289 05/04/2019   No results found for: IRON, TIBC, FERRITIN  Attestation Statements:   Reviewed by clinician on day of visit: allergies, medications, problem list, medical history, surgical history, family history, social history, and previous encounter notes.  Fernanda Drum, am acting as Energy manager for [employer], DO   I have reviewed the above documentation for accuracy and completeness, and I agree with the above. Corinna Capra, DO

## 2020-01-01 ENCOUNTER — Other Ambulatory Visit: Payer: Self-pay

## 2020-01-01 ENCOUNTER — Telehealth (INDEPENDENT_AMBULATORY_CARE_PROVIDER_SITE_OTHER): Payer: BC Managed Care – PPO | Admitting: Psychology

## 2020-01-01 DIAGNOSIS — F3289 Other specified depressive episodes: Secondary | ICD-10-CM

## 2020-01-10 ENCOUNTER — Other Ambulatory Visit (INDEPENDENT_AMBULATORY_CARE_PROVIDER_SITE_OTHER): Payer: Self-pay | Admitting: Family Medicine

## 2020-01-10 DIAGNOSIS — R7303 Prediabetes: Secondary | ICD-10-CM

## 2020-01-11 DIAGNOSIS — R002 Palpitations: Secondary | ICD-10-CM | POA: Diagnosis not present

## 2020-01-15 NOTE — Progress Notes (Signed)
  Office: [phone]  /  Fax: [phone]    Date: Jan 29, 2020   Appointment Start Time: 10:30am Duration: 22 minutes Provider: Lawerance Cruel, Psy.D. Type of Session: Individual Therapy  Location of Patient: Home Location of Provider: Provider's Home Type of Contact: Telepsychological Visit via MyChart Video Visit  Session Content: Nicole [name] is a 39 y.o. female presenting via MyChart Video Visit for a follow-up appointment to address the previously established treatment goal of decreasing emotional eating. Today's appointment was a telepsychological visit due to COVID-19. Nicole [name] provided verbal consent for today's telepsychological appointment and she is aware she is responsible for securing confidentiality on her end of the session. Prior to proceeding with today's appointment, Nicole [name]'s physical location at the time of this appointment was obtained as well a phone number she could be reached at in the event of technical difficulties. Nicole [name] and this provider participated in today's telepsychological service.   This provider conducted a brief check-in. Nicole [name] shared about recent events, including challenges with eating as her husband prepared to leave for a trip. The previously established plan to help cope when her husband is away was reviewed. Moreover, a plan was developed to help Nicole [name] cope with emotional eating in the future using learned skills. She wrote down the following plan: focus on hydration; be prepared with snacks congruent to the meal plan; pause to ask questions when triggered to eat (e.g., Am I really hungry?, Is there something bothering me?, and Will I feel better if I eat?); and engage in discussed coping strategies after going through the aforementioned questions. Overall, Nicole [name] was receptive to today's appointment as evidenced by openness to sharing, responsiveness to feedback, and willingness to continue engaging in learned skills.  Mental Status Examination:  Appearance: well  groomed and appropriate hygiene  Behavior: appropriate to circumstances Mood: euthymic Affect: mood congruent Speech: normal in rate, volume, and tone Eye Contact: appropriate Psychomotor Activity: appropriate Gait: unable to assess Thought Process: linear, logical, and goal directed  Thought Content/Perception: no hallucinations, delusions, bizarre thinking or behavior reported or observed and no evidence of suicidal and homicidal ideation, plan, and intent Orientation: time, person, place and purpose of appointment Memory/Concentration: memory, attention, language, and fund of knowledge intact  Insight/Judgment: good   Interventions:  Conducted a brief chart review Provided empathic reflections and validation Reviewed content from the previous session Employed supportive psychotherapy interventions to facilitate reduced distress and to improve coping skills with identified stressors Employed motivational interviewing skills to assess patient's willingness/desire to adhere to recommended medical treatments and assignments Reviewed learned skills  DSM-5 Diagnosis(es): 311 (F32.8) Other Specified Depressive Disorder, Emotional Eating Behaviors  Treatment Goal & Progress: During the initial appointment with this provider, the following treatment goal was established: decrease emotional eating. Nicole [name] demonstrated progress in her goal as evidenced by increased awareness of hunger patterns, increased awareness of triggers for emotional eating and reduction in emotional eating. Nicole [name] also continues to demonstrate willingness to engage in learned skill(s).  Plan: As previously planned, today was Nicole [name]'s last appointment with this provider. She acknowledged understanding that she may request a follow-up appointment with this provider in the future as long as she is still established with the clinic. No further follow-up planned by this provider.

## 2020-01-24 ENCOUNTER — Encounter (INDEPENDENT_AMBULATORY_CARE_PROVIDER_SITE_OTHER): Payer: Self-pay | Admitting: Bariatrics

## 2020-01-24 ENCOUNTER — Other Ambulatory Visit: Payer: Self-pay

## 2020-01-24 ENCOUNTER — Ambulatory Visit (INDEPENDENT_AMBULATORY_CARE_PROVIDER_SITE_OTHER): Payer: BC Managed Care – PPO | Admitting: Bariatrics

## 2020-01-24 VITALS — BP 107/76 | HR 79 | Temp 98.3°F | Ht 65.0 in | Wt 204.0 lb

## 2020-01-24 DIAGNOSIS — E669 Obesity, unspecified: Secondary | ICD-10-CM | POA: Diagnosis not present

## 2020-01-24 DIAGNOSIS — E8881 Metabolic syndrome: Secondary | ICD-10-CM | POA: Diagnosis not present

## 2020-01-24 DIAGNOSIS — Z6834 Body mass index (BMI) 34.0-34.9, adult: Secondary | ICD-10-CM

## 2020-01-24 DIAGNOSIS — E038 Other specified hypothyroidism: Secondary | ICD-10-CM

## 2020-01-24 NOTE — Progress Notes (Signed)
Chief Complaint:   OBESITY Nicole [name] is here to discuss her progress with her obesity treatment plan along with follow-up of her obesity related diagnoses. Nicole [name] is keeping a food journal and adhering to recommended goals of 1500-1600 calories and 85 grams of protein and states she is following her eating plan approximately 40% of the time. Douglas states she is walking 15-20 minutes 3 times per week.  Today's visit was #: 8 Starting weight: 210 lbs Starting date: 09/26/2019 Today's weight: 204 lbs Today's date: 01/24/2020 Total lbs lost to date: 6 Total lbs lost since last in-office visit: 2  Interim History: Nicole [name] is down 2 lbs. She reports drinking adequate water, but struggles with protein.  Subjective:   Insulin resistance. [name] has a diagnosis of insulin resistance based on her elevated fasting insulin level >5. She continues to work on diet and exercise to decrease her risk of diabetes. No polyphagia.  Lab Results  Component Value Date   INSULIN 14.7 12/27/2019   INSULIN 14.8 09/26/2019   Lab Results  Component Value Date   HGBA1C 5.8 (H) 12/27/2019   Other specified hypothyroidism. Nicole [name] is taking Synthroid.  Lab Results  Component Value Date   TSH 1.530 12/27/2019   Assessment/Plan:   Insulin resistance. Nicole [name] will continue to work on weight loss, increasing activity, increasing healthy fats and protein, and decreasing simple carbohydrates to help decrease the risk of diabetes. Nicole [name] agreed to follow-up with [place] as directed to closely monitor her progress.  Other specified hypothyroidism. Patient with long-standing hypothyroidism, on levothyroxine therapy. She appears euthyroid. Orders and follow up as documented in patient record. Nicole [name] will continue her medication as directed.  Counseling . Good thyroid control is important for overall health. Supratherapeutic thyroid levels are dangerous and will not improve weight loss results. . The correct way to take  levothyroxine is fasting, with water, separated by at least 30 minutes from breakfast, and separated by more than 4 hours from calcium, iron, multivitamins, acid reflux medications (PPIs).   Class 1 obesity with serious comorbidity and body mass index (BMI) of 34.0 to 34.9 in adult, unspecified obesity type.  [name] is currently in the action stage of change. As such, her goal is to continue with weight loss efforts. She has agreed to keeping a food journal and adhering to recommended goals of 1500-1600 calories and 85+ grams of protein.   She will work on meal planning, intentional eating, mindful eating, and increasing her protein intake.  Exercise goals: Nicole [name] will continue walking for exercise.  Behavioral modification strategies: increasing lean protein intake, decreasing simple carbohydrates, increasing vegetables, increasing water intake, decreasing eating out, no skipping meals, meal planning and cooking strategies and keeping healthy foods in the home.  Nicole [name] has agreed to follow-up with our clinic in 2 weeks. She was informed of the importance of frequent follow-up visits to maximize her success with intensive lifestyle modifications for her multiple health conditions.   Objective:   Blood pressure 107/76, pulse 79, temperature 98.3 F (36.8 C), height 5\' 5"  (1.651 m), weight 204 lb (92.5 kg), last menstrual period 01/06/2020, SpO2 98 %. Body mass index is 33.95 kg/m.  General: Cooperative, alert, well developed, in no acute distress. HEENT: Conjunctivae and lids unremarkable. Cardiovascular: Regular rhythm.  Lungs: Normal work of breathing. Neurologic: No focal deficits.   Lab Results  Component Value Date   CREATININE 0.59 12/27/2019   BUN 8 12/27/2019   NA 139 12/27/2019   K 4.3 12/27/2019  CL 101 12/27/2019   CO2 23 12/27/2019   Lab Results  Component Value Date   ALT 20 12/27/2019   AST 18 12/27/2019   ALKPHOS 64 12/27/2019   BILITOT 0.4 12/27/2019   Lab  Results  Component Value Date   HGBA1C 5.8 (H) 12/27/2019   HGBA1C 6.1 (H) 09/26/2019   HGBA1C 6.0 (H) 09/21/2018   HGBA1C 6.0 (H) 05/31/2014   Lab Results  Component Value Date   INSULIN 14.7 12/27/2019   INSULIN 14.8 09/26/2019   Lab Results  Component Value Date   TSH 1.530 12/27/2019   Lab Results  Component Value Date   CHOL 160 12/27/2019   HDL 38 (L) 12/27/2019   LDLCALC 94 12/27/2019   TRIG 159 (H) 12/27/2019   CHOLHDL 3.8 08/25/2018   Lab Results  Component Value Date   WBC 6.5 05/04/2019   HGB 13.0 05/04/2019   HCT 38.7 05/04/2019   MCV 91.5 05/04/2019   PLT 289 05/04/2019   No results found for: IRON, TIBC, FERRITIN  Attestation Statements:   Reviewed by clinician on day of visit: allergies, medications, problem list, medical history, surgical history, family history, social history, and previous encounter notes.  Time spent on visit including pre-visit chart review and post-visit charting and care was 20 minutes.   Migdalia Dk, am acting as Location manager for [employer], DO   I have reviewed the above documentation for accuracy and completeness, and I agree with the above. Jearld Lesch, DO

## 2020-01-29 ENCOUNTER — Telehealth (INDEPENDENT_AMBULATORY_CARE_PROVIDER_SITE_OTHER): Payer: BC Managed Care – PPO | Admitting: Psychology

## 2020-01-29 DIAGNOSIS — F3289 Other specified depressive episodes: Secondary | ICD-10-CM | POA: Diagnosis not present

## 2020-02-04 DIAGNOSIS — F422 Mixed obsessional thoughts and acts: Secondary | ICD-10-CM | POA: Diagnosis not present

## 2020-02-04 DIAGNOSIS — F331 Major depressive disorder, recurrent, moderate: Secondary | ICD-10-CM | POA: Diagnosis not present

## 2020-02-04 DIAGNOSIS — F411 Generalized anxiety disorder: Secondary | ICD-10-CM | POA: Diagnosis not present

## 2020-02-04 DIAGNOSIS — F424 Excoriation (skin-picking) disorder: Secondary | ICD-10-CM | POA: Diagnosis not present

## 2020-02-07 ENCOUNTER — Ambulatory Visit (INDEPENDENT_AMBULATORY_CARE_PROVIDER_SITE_OTHER): Payer: BC Managed Care – PPO | Admitting: Bariatrics

## 2020-02-21 ENCOUNTER — Encounter (INDEPENDENT_AMBULATORY_CARE_PROVIDER_SITE_OTHER): Payer: Self-pay | Admitting: Family Medicine

## 2020-02-21 ENCOUNTER — Other Ambulatory Visit: Payer: Self-pay

## 2020-02-21 ENCOUNTER — Ambulatory Visit (INDEPENDENT_AMBULATORY_CARE_PROVIDER_SITE_OTHER): Payer: BC Managed Care – PPO | Admitting: Family Medicine

## 2020-02-21 VITALS — BP 112/73 | HR 73 | Temp 98.3°F | Ht 65.0 in | Wt 201.0 lb

## 2020-02-21 DIAGNOSIS — Z9189 Other specified personal risk factors, not elsewhere classified: Secondary | ICD-10-CM | POA: Diagnosis not present

## 2020-02-21 DIAGNOSIS — R7303 Prediabetes: Secondary | ICD-10-CM | POA: Diagnosis not present

## 2020-02-21 DIAGNOSIS — F3289 Other specified depressive episodes: Secondary | ICD-10-CM

## 2020-02-21 DIAGNOSIS — E669 Obesity, unspecified: Secondary | ICD-10-CM

## 2020-02-21 DIAGNOSIS — E559 Vitamin D deficiency, unspecified: Secondary | ICD-10-CM | POA: Diagnosis not present

## 2020-02-21 DIAGNOSIS — Z6833 Body mass index (BMI) 33.0-33.9, adult: Secondary | ICD-10-CM

## 2020-02-21 MED ORDER — VITAMIN D (ERGOCALCIFEROL) 1.25 MG (50000 UNIT) PO CAPS: 50000.0000 [IU] | ORAL_CAPSULE | ORAL | 0 refills | Status: DC

## 2020-02-21 NOTE — Progress Notes (Signed)
Chief Complaint:   OBESITY Nicole [name] is here to discuss her progress with her obesity treatment plan along with follow-up of her obesity related diagnoses. [name] is on keeping a food journal and adhering to recommended goals of 1500-1600 calories and 85+ grams of protein daily and states she is following her eating plan approximately 50% of the time. [name] states she is doing 0 minutes 0 times per week.  Today's visit was #: 9 Starting weight: 210 lbs Starting date: 09/26/2019 Today's weight: 201 lbs Today's date: 02/21/2020 Total lbs lost to date: 9 Total lbs lost since last in-office visit: 3  Interim History: Nicole [name] is focusing on mindful eating and hunger cues. She is journaling very little but feels that she knows the calorie and protein amounts of most food that she eats. She does admit that she does not eat enough protein She has worked on making less casseroles and more meat and vegetables.  Subjective:   1. Pre-diabetes Paticia has a diagnosis of pre-diabetes based on her elevated Hgb A1c and was informed this puts her at greater risk of developing diabetes. She is on metformin BID, but she has not been consistent with taking it. She notes some GI upset with it. She continues to work on diet and exercise to decrease her risk of diabetes. She denies nausea or hypoglycemia.  Lab Results  Component Value Date   HGBA1C 5.8 (H) 12/27/2019   Lab Results  Component Value Date   INSULIN 14.7 12/27/2019   INSULIN 14.8 09/26/2019   2. Vitamin D deficiency Nicole [name]'s Vit D level is at goal. She is on weekly prescription Vit D.  3. Other depression, with emotional  Nicole [name] notes emotional eating is better recently. She notes Psychiatry has put her back on bupropion. She sees psych for anxiety and depression.  4. At risk for deficient intake of food The patient is at a higher than average risk of deficient intake of food due to inadequate protein intake.  Assessment/Plan:   1.  Pre-diabetes Nicole [name] will continue to work on weight loss, exercise, and decreasing simple carbohydrates to help decrease the risk of diabetes. Nicole [name] is to take 250 mg of metformin daily.  2. Vitamin D deficiency Low Vitamin D level contributes to fatigue and are associated with obesity, breast, and colon cancer. We will refill prescription Vitamin D for 1 month. Nicole [name] will follow-up for routine testing of Vitamin D, at least 2-3 times per year to avoid over-replacement.  - Vitamin D, Ergocalciferol, (DRISDOL) 1.25 MG (50000 UNIT) CAPS capsule; Take 1 capsule (50,000 Units total) by mouth every 7 (seven) days.  Dispense: 4 capsule; Refill: 0  3. Other depression, with emotional  Behavior modification techniques were discussed today to help Nicole [name] deal with her emotional/non-hunger eating behaviors. [name] will continue bupropion as directed by her Psychiatrist. Orders and follow up as documented in patient record.   4. At risk for deficient intake of food Nicole [name] was given approximately 15 minutes of deficit intake of food prevention counseling today. Nicole [name] is at risk for eating too few calories based on current food recall. She was encouraged to focus on meeting caloric and protein goals according to her recommended meal plan.   5. Class 1 obesity with serious comorbidity and body mass index (BMI) of 33.0 to 33.9 in adult, unspecified obesity type Nicole [name] is currently in the action stage of change. As such, her goal is to continue with weight loss efforts. She has agreed to keeping a food  journal and adhering to recommended goals of 1500-1600 calories and 85+ grams of protein daily.   Exercise goals: No exercise has been prescribed at this time.  Behavioral modification strategies: increasing lean protein intake, increasing water intake and meal planning and cooking strategies.  Nicole [name] has agreed to follow-up with our clinic in 4 weeks at her request. She was informed of the importance of frequent  follow-up visits to maximize her success with intensive lifestyle modifications for her multiple health conditions.   Objective:   Blood pressure 112/73, pulse 73, temperature 98.3 F (36.8 C), temperature source Oral, height 5\' 5"  (1.651 m), weight 201 lb (91.2 kg), SpO2 97 %. Body mass index is 33.45 kg/m.  General: Cooperative, alert, well developed, in no acute distress. HEENT: Conjunctivae and lids unremarkable. Cardiovascular: Regular rhythm.  Lungs: Normal work of breathing. Neurologic: No focal deficits.   Lab Results  Component Value Date   CREATININE 0.59 12/27/2019   BUN 8 12/27/2019   NA 139 12/27/2019   K 4.3 12/27/2019   CL 101 12/27/2019   CO2 23 12/27/2019   Lab Results  Component Value Date   ALT 20 12/27/2019   AST 18 12/27/2019   ALKPHOS 64 12/27/2019   BILITOT 0.4 12/27/2019   Lab Results  Component Value Date   HGBA1C 5.8 (H) 12/27/2019   HGBA1C 6.1 (H) 09/26/2019   HGBA1C 6.0 (H) 09/21/2018   HGBA1C 6.0 (H) 05/31/2014   Lab Results  Component Value Date   INSULIN 14.7 12/27/2019   INSULIN 14.8 09/26/2019   Lab Results  Component Value Date   TSH 1.530 12/27/2019   Lab Results  Component Value Date   CHOL 160 12/27/2019   HDL 38 (L) 12/27/2019   LDLCALC 94 12/27/2019   TRIG 159 (H) 12/27/2019   CHOLHDL 3.8 08/25/2018   Lab Results  Component Value Date   WBC 6.5 05/04/2019   HGB 13.0 05/04/2019   HCT 38.7 05/04/2019   MCV 91.5 05/04/2019   PLT 289 05/04/2019   No results found for: IRON, TIBC, FERRITIN  Attestation Statements:   Reviewed by clinician on day of visit: allergies, medications, problem list, medical history, surgical history, family history, social history, and previous encounter notes.   Wilhemena Durie, am acting as Location manager for [employer], FNP-C.  I have reviewed the above documentation for accuracy and completeness, and I agree with the above. -  Georgianne Fick, FNP

## 2020-03-10 ENCOUNTER — Other Ambulatory Visit (INDEPENDENT_AMBULATORY_CARE_PROVIDER_SITE_OTHER): Payer: Self-pay | Admitting: Family Medicine

## 2020-03-10 DIAGNOSIS — E559 Vitamin D deficiency, unspecified: Secondary | ICD-10-CM

## 2020-03-20 ENCOUNTER — Encounter (INDEPENDENT_AMBULATORY_CARE_PROVIDER_SITE_OTHER): Payer: Self-pay | Admitting: Family Medicine

## 2020-03-20 ENCOUNTER — Ambulatory Visit (INDEPENDENT_AMBULATORY_CARE_PROVIDER_SITE_OTHER): Payer: BC Managed Care – PPO | Admitting: Family Medicine

## 2020-03-20 ENCOUNTER — Other Ambulatory Visit: Payer: Self-pay

## 2020-03-20 VITALS — BP 104/69 | HR 87 | Temp 98.9°F | Ht 65.0 in | Wt 202.0 lb

## 2020-03-20 DIAGNOSIS — F3289 Other specified depressive episodes: Secondary | ICD-10-CM

## 2020-03-20 DIAGNOSIS — R7303 Prediabetes: Secondary | ICD-10-CM | POA: Diagnosis not present

## 2020-03-20 DIAGNOSIS — E669 Obesity, unspecified: Secondary | ICD-10-CM

## 2020-03-20 DIAGNOSIS — Z6833 Body mass index (BMI) 33.0-33.9, adult: Secondary | ICD-10-CM | POA: Diagnosis not present

## 2020-03-25 NOTE — Progress Notes (Signed)
Chief Complaint:   OBESITY Nicole [name] is here to discuss her progress with her obesity treatment plan along with follow-up of her obesity related diagnoses. Nicole [name] is on keeping a food journal and adhering to recommended goals of 1500-1600 calories and 85+ grams of protein daily and states she is following her eating plan approximately 65% of the time. Nicole [name] states she is walking for 30 minutes 1 time per week.  Today's visit was #: 10 Starting weight: 210 lbs Starting date: 09/26/2019 Today's weight: 202 lbs Today's date: 03/20/2020 Total lbs lost to date: 8 Total lbs lost since last in-office visit: 0  Interim History: Nicole [name] notes several special events and has been off the plan somewhat. She is journaling in her head and paying more attention to protein and calories. She is practicing intentional eating.  Subjective:   1. Pre-diabetes Nile has a diagnosis of pre-diabetes based on her elevated Hgb A1c and was informed this puts her at greater risk of developing diabetes. She denies polyphagia. Last A1c was 5.8 (down from 6.1). She notes diarrhea with metformin with too many carbohydrates. She has not been taking metformin.  Lab Results  Component Value Date   HGBA1C 5.8 (H) 12/27/2019   Lab Results  Component Value Date   INSULIN 14.7 12/27/2019   INSULIN 14.8 09/26/2019   2. Other depression, with emotional  Nicole [name] notes she has a history of binging but she is not purging. She notes this has improved. She is finished with counseling with Dr. Dewaine Conger.  Assessment/Plan:   1. Pre-diabetes Nicole [name] will continue to work on weight loss, exercise, and decreasing simple carbohydrates to help decrease the risk of diabetes. Nicole [name] will try talking 250 mg of metformin.  2. Other depression, with emotional  Behavior modification techniques were discussed today, and Nicole [name] will continue strategies to help decrease emotional/non-hunger eating behaviors. Orders and follow up as documented in  patient record.   3. Class 1 obesity with serious comorbidity and body mass index (BMI) of 33.0 to 33.9 in adult, unspecified obesity type Nicole [name] is currently in the action stage of change. As such, her goal is to continue with weight loss efforts. She has agreed to keeping a food journal and adhering to recommended goals of 1500-1600 calories and 85+ grams of protein daily.   Nicole [name] will try to journal more formally using [employer].  Exercise goals: As is.  Behavioral modification strategies: keeping a strict food journal.  Nicole [name] has agreed to follow-up with our clinic in 4 weeks. She was informed of the importance of frequent follow-up visits to maximize her success with intensive lifestyle modifications for her multiple health conditions.   Objective:   Blood pressure 104/69, pulse 87, temperature 98.9 F (37.2 C), temperature source Oral, height 5\' 5"  (1.651 m), weight 202 lb (91.6 kg), last menstrual period 03/20/2020, SpO2 97 %. Body mass index is 33.61 kg/m.  General: Cooperative, alert, well developed, in no acute distress. HEENT: Conjunctivae and lids unremarkable. Cardiovascular: Regular rhythm.  Lungs: Normal work of breathing. Neurologic: No focal deficits.   Lab Results  Component Value Date   CREATININE 0.59 12/27/2019   BUN 8 12/27/2019   NA 139 12/27/2019   K 4.3 12/27/2019   CL 101 12/27/2019   CO2 23 12/27/2019   Lab Results  Component Value Date   ALT 20 12/27/2019   AST 18 12/27/2019   ALKPHOS 64 12/27/2019   BILITOT 0.4 12/27/2019   Lab Results  Component Value Date  HGBA1C 5.8 (H) 12/27/2019   HGBA1C 6.1 (H) 09/26/2019   HGBA1C 6.0 (H) 09/21/2018   HGBA1C 6.0 (H) 05/31/2014   Lab Results  Component Value Date   INSULIN 14.7 12/27/2019   INSULIN 14.8 09/26/2019   Lab Results  Component Value Date   TSH 1.530 12/27/2019   Lab Results  Component Value Date   CHOL 160 12/27/2019   HDL 38 (L) 12/27/2019   LDLCALC 94 12/27/2019   TRIG  159 (H) 12/27/2019   CHOLHDL 3.8 08/25/2018   Lab Results  Component Value Date   WBC 6.5 05/04/2019   HGB 13.0 05/04/2019   HCT 38.7 05/04/2019   MCV 91.5 05/04/2019   PLT 289 05/04/2019   No results found for: IRON, TIBC, FERRITIN  Attestation Statements:   Reviewed by clinician on day of visit: allergies, medications, problem list, medical history, surgical history, family history, social history, and previous encounter notes.   Trude Mcburney, am acting as Energy manager for [place], FNP-C.  I have reviewed the above documentation for accuracy and completeness, and I agree with the above. -  Jesse Sans, FNP

## 2020-03-26 ENCOUNTER — Encounter (INDEPENDENT_AMBULATORY_CARE_PROVIDER_SITE_OTHER): Payer: Self-pay | Admitting: Family Medicine

## 2020-04-07 ENCOUNTER — Other Ambulatory Visit (INDEPENDENT_AMBULATORY_CARE_PROVIDER_SITE_OTHER): Payer: Self-pay | Admitting: Family Medicine

## 2020-04-07 DIAGNOSIS — E559 Vitamin D deficiency, unspecified: Secondary | ICD-10-CM

## 2020-04-16 ENCOUNTER — Other Ambulatory Visit: Payer: Self-pay

## 2020-04-16 ENCOUNTER — Ambulatory Visit (INDEPENDENT_AMBULATORY_CARE_PROVIDER_SITE_OTHER): Payer: BC Managed Care – PPO | Admitting: Family Medicine

## 2020-04-16 ENCOUNTER — Encounter (INDEPENDENT_AMBULATORY_CARE_PROVIDER_SITE_OTHER): Payer: Self-pay | Admitting: Family Medicine

## 2020-04-16 VITALS — BP 121/77 | HR 77 | Temp 97.6°F | Ht 65.0 in | Wt 201.0 lb

## 2020-04-16 DIAGNOSIS — E038 Other specified hypothyroidism: Secondary | ICD-10-CM

## 2020-04-16 DIAGNOSIS — R7303 Prediabetes: Secondary | ICD-10-CM

## 2020-04-16 DIAGNOSIS — E559 Vitamin D deficiency, unspecified: Secondary | ICD-10-CM

## 2020-04-16 DIAGNOSIS — E7849 Other hyperlipidemia: Secondary | ICD-10-CM | POA: Diagnosis not present

## 2020-04-16 DIAGNOSIS — E669 Obesity, unspecified: Secondary | ICD-10-CM

## 2020-04-16 DIAGNOSIS — Z6833 Body mass index (BMI) 33.0-33.9, adult: Secondary | ICD-10-CM

## 2020-04-16 MED ORDER — VITAMIN D (ERGOCALCIFEROL) 1.25 MG (50000 UNIT) PO CAPS: 50000.0000 [IU] | ORAL_CAPSULE | ORAL | 0 refills | Status: DC

## 2020-04-16 NOTE — Progress Notes (Signed)
Chief Complaint:   OBESITY Nicole [name] is here to discuss her progress with her obesity treatment plan along with follow-up of her obesity related diagnoses. Nicole [name] is on keeping a food journal and adhering to recommended goals of 1500-1600 calories and 85+ grams of protein daily and states she is following her eating plan approximately 50% of the time. Nicole [name] states she is walking for 30 minutes 2 times per week.  Today's visit was #: 11 Starting weight: 210 lbs Starting date: 09/26/2019 Today's weight: 201 lbs Today's date: 04/16/2020 Total lbs lost to date: 9 Total lbs lost since last in-office visit: 1  Interim History: Nicole [name] has been very busy with home and church responsibilities. She is not journaling. She has eaten a lot of meals at church recently, but she has made smart choices.  She is considering going to the gym to work out.  Subjective:   1. Vitamin D deficiency Nicole [name]'s last Vit D was at goal at 51.8. She is on weekly prescription Vit D.  2. Pre-diabetes Nicole [name] has a diagnosis of pre-diabetes based on her elevated Hgb A1c and was informed this puts her at greater risk of developing diabetes. She is able to tolerate 250 mg of metformin BID. Last A1c was 5.8. She continues to work on diet and exercise to decrease her risk of diabetes. She denies nausea or hypoglycemia.  Lab Results  Component Value Date   HGBA1C 5.8 (H) 12/27/2019   Lab Results  Component Value Date   INSULIN 14.7 12/27/2019   INSULIN 14.8 09/26/2019   3. Other hyperlipidemia Nicole [name] has hyperlipidemia and has been trying to improve her cholesterol levels with intensive lifestyle modification including a low saturated fat diet, exercise and weight loss. Last triglycerides was elevated at 159, HDL low at 38, and LDL within normal limits at 94. She is not on statin. She denies any chest pain, claudication or myalgias.  Lab Results  Component Value Date   ALT 20 12/27/2019   AST 18 12/27/2019   ALKPHOS 64  12/27/2019   BILITOT 0.4 12/27/2019   Lab Results  Component Value Date   CHOL 160 12/27/2019   HDL 38 (L) 12/27/2019   LDLCALC 94 12/27/2019   TRIG 159 (H) 12/27/2019   CHOLHDL 3.8 08/25/2018   4. Other specified hypothyroidism Nicole [name] is stable on 50 mcg levothyroxine. Current symptoms: none.   Lab Results  Component Value Date   TSH 1.530 12/27/2019   Assessment/Plan:   1. Vitamin D deficiency Low Vitamin D level contributes to fatigue and are associated with obesity, breast, and colon cancer. We will check labs today, and we will refill prescription Vitamin D for 1 month. Ilda will follow-up for routine testing of Vitamin D, at least 2-3 times per year to avoid over-replacement.  - VITAMIN D 25 Hydroxy (Vit-D Deficiency, Fractures) - Vitamin D, Ergocalciferol, (DRISDOL) 1.25 MG (50000 UNIT) CAPS capsule; Take 1 capsule (50,000 Units total) by mouth every 7 (seven) days.  Dispense: 4 capsule; Refill: 0  2. Pre-diabetes Nicole [name] will continue metformin, and will continue to work on weight loss, exercise, and decreasing simple carbohydrates to help decrease the risk of diabetes. We will recheck labs today.  - Comprehensive metabolic panel - Hemoglobin A1c - Insulin, random  3. Other hyperlipidemia Cardiovascular risk and specific lipid/LDL goals reviewed. We discussed several lifestyle modifications today. Nicole [name] will continue to work on diet, exercise and weight loss efforts. We will check labs today.   - Comprehensive metabolic panel -  Lipid Panel With LDL/HDL Ratio  4. Other specified hypothyroidism Nicole [name] is with long-standing hypothyroidism, and will continue levothyroxine. She appears euthyroid. We will check labs today.  - Comprehensive metabolic panel - T3 - T4, free - TSH  5. Class 1 obesity with serious comorbidity and body mass index (BMI) of 33.0 to 33.9 in adult, unspecified obesity type Nicole [name] is currently in the action stage of change. As such, her goal is to  continue with weight loss efforts. She has agreed to keeping a food journal and adhering to recommended goals of 1500-1600 calories and 85+ grams of protein daily.   Exercise goals: As is.  Behavioral modification strategies: increasing lean protein intake, decreasing simple carbohydrates and planning for success.  Nicole [name] has agreed to follow-up with our clinic in 4 weeks. She was informed of the importance of frequent follow-up visits to maximize her success with intensive lifestyle modifications for her multiple health conditions.   Nicole [name] was informed we would discuss her lab results at her next visit unless there is a critical issue that needs to be addressed sooner. Nicole [name] agreed to keep her next visit at the agreed upon time to discuss these results.  Objective:   Blood pressure 121/77, pulse 77, temperature 97.6 F (36.4 C), temperature source Oral, height 5\' 5"  (1.651 m), weight 201 lb (91.2 kg), last menstrual period 03/20/2020, SpO2 98 %. Body mass index is 33.45 kg/m.  General: Cooperative, alert, well developed, in no acute distress. HEENT: Conjunctivae and lids unremarkable. Cardiovascular: Regular rhythm.  Lungs: Normal work of breathing. Neurologic: No focal deficits.   Lab Results  Component Value Date   CREATININE 0.59 12/27/2019   BUN 8 12/27/2019   NA 139 12/27/2019   K 4.3 12/27/2019   CL 101 12/27/2019   CO2 23 12/27/2019   Lab Results  Component Value Date   ALT 20 12/27/2019   AST 18 12/27/2019   ALKPHOS 64 12/27/2019   BILITOT 0.4 12/27/2019   Lab Results  Component Value Date   HGBA1C 5.8 (H) 12/27/2019   HGBA1C 6.1 (H) 09/26/2019   HGBA1C 6.0 (H) 09/21/2018   HGBA1C 6.0 (H) 05/31/2014   Lab Results  Component Value Date   INSULIN 14.7 12/27/2019   INSULIN 14.8 09/26/2019   Lab Results  Component Value Date   TSH 1.530 12/27/2019   Lab Results  Component Value Date   CHOL 160 12/27/2019   HDL 38 (L) 12/27/2019   LDLCALC 94 12/27/2019    TRIG 159 (H) 12/27/2019   CHOLHDL 3.8 08/25/2018   Lab Results  Component Value Date   WBC 6.5 05/04/2019   HGB 13.0 05/04/2019   HCT 38.7 05/04/2019   MCV 91.5 05/04/2019   PLT 289 05/04/2019   No results found for: IRON, TIBC, FERRITIN  Attestation Statements:   Reviewed by clinician on day of visit: allergies, medications, problem list, medical history, surgical history, family history, social history, and previous encounter notes.   05/06/2019, am acting as Trude Mcburney for Energy manager, FNP-C.  I have reviewed the above documentation for accuracy and completeness, and I agree with the above. -  [place], FNP

## 2020-04-17 LAB — COMPREHENSIVE METABOLIC PANEL
ALT: 13 IU/L (ref 0–32)
AST: 12 IU/L (ref 0–40)
Albumin/Globulin Ratio: 1.8 (ref 1.2–2.2)
Albumin: 4.6 g/dL (ref 3.8–4.8)
Alkaline Phosphatase: 75 IU/L (ref 48–121)
BUN/Creatinine Ratio: 13 (ref 9–23)
BUN: 9 mg/dL (ref 6–24)
Bilirubin Total: 0.4 mg/dL (ref 0.0–1.2)
CO2: 23 mmol/L (ref 20–29)
Calcium: 10 mg/dL (ref 8.7–10.2)
Chloride: 100 mmol/L (ref 96–106)
Creatinine, Ser: 0.71 mg/dL (ref 0.57–1.00)
GFR calc Af Amer: 123 mL/min/{1.73_m2} (ref >59)
GFR calc non Af Amer: 107 mL/min/{1.73_m2} (ref >59)
Globulin, Total: 2.5 g/dL (ref 1.5–4.5)
Glucose: 129 mg/dL — ABNORMAL HIGH (ref 65–99)
Potassium: 4.5 mmol/L (ref 3.5–5.2)
Sodium: 138 mmol/L (ref 134–144)
Total Protein: 7.1 g/dL (ref 6.0–8.5)

## 2020-04-17 LAB — HEMOGLOBIN A1C
Est. average glucose Bld gHb Est-mCnc: 128 mg/dL
Hgb A1c MFr Bld: 6.1 % — ABNORMAL HIGH (ref 4.8–5.6)

## 2020-04-17 LAB — LIPID PANEL WITH LDL/HDL RATIO
Cholesterol, Total: 186 mg/dL (ref 100–199)
HDL: 39 mg/dL — ABNORMAL LOW (ref >39)
LDL Chol Calc (NIH): 112 mg/dL — ABNORMAL HIGH (ref 0–99)
LDL/HDL Ratio: 2.9 ratio (ref 0.0–3.2)
Triglycerides: 198 mg/dL — ABNORMAL HIGH (ref 0–149)
VLDL Cholesterol Cal: 35 mg/dL (ref 5–40)

## 2020-04-17 LAB — INSULIN, RANDOM: INSULIN: 18.4 u[IU]/mL (ref 2.6–24.9)

## 2020-04-17 LAB — T4, FREE: Free T4: 1.49 ng/dL (ref 0.82–1.77)

## 2020-04-17 LAB — VITAMIN D 25 HYDROXY (VIT D DEFICIENCY, FRACTURES): Vit D, 25-Hydroxy: 50 ng/mL (ref 30.0–100.0)

## 2020-04-17 LAB — TSH: TSH: 2.24 u[IU]/mL (ref 0.450–4.500)

## 2020-04-17 LAB — T3: T3, Total: 123 ng/dL (ref 71–180)

## 2020-05-07 DIAGNOSIS — F424 Excoriation (skin-picking) disorder: Secondary | ICD-10-CM | POA: Diagnosis not present

## 2020-05-07 DIAGNOSIS — F422 Mixed obsessional thoughts and acts: Secondary | ICD-10-CM | POA: Diagnosis not present

## 2020-05-07 DIAGNOSIS — F411 Generalized anxiety disorder: Secondary | ICD-10-CM | POA: Diagnosis not present

## 2020-05-07 DIAGNOSIS — F331 Major depressive disorder, recurrent, moderate: Secondary | ICD-10-CM | POA: Diagnosis not present

## 2020-05-14 ENCOUNTER — Encounter (INDEPENDENT_AMBULATORY_CARE_PROVIDER_SITE_OTHER): Payer: Self-pay | Admitting: Family Medicine

## 2020-05-14 ENCOUNTER — Ambulatory Visit (INDEPENDENT_AMBULATORY_CARE_PROVIDER_SITE_OTHER): Payer: BC Managed Care – PPO | Admitting: Family Medicine

## 2020-05-14 DIAGNOSIS — E559 Vitamin D deficiency, unspecified: Secondary | ICD-10-CM

## 2020-05-14 MED ORDER — VITAMIN D (ERGOCALCIFEROL) 1.25 MG (50000 UNIT) PO CAPS: 50000.0000 [IU] | ORAL_CAPSULE | ORAL | 0 refills | Status: DC

## 2020-05-26 ENCOUNTER — Other Ambulatory Visit: Payer: Self-pay

## 2020-05-26 ENCOUNTER — Ambulatory Visit (INDEPENDENT_AMBULATORY_CARE_PROVIDER_SITE_OTHER): Payer: BC Managed Care – PPO | Admitting: Family Medicine

## 2020-05-26 ENCOUNTER — Encounter (INDEPENDENT_AMBULATORY_CARE_PROVIDER_SITE_OTHER): Payer: Self-pay | Admitting: Family Medicine

## 2020-05-26 ENCOUNTER — Other Ambulatory Visit (INDEPENDENT_AMBULATORY_CARE_PROVIDER_SITE_OTHER): Payer: Self-pay | Admitting: Family Medicine

## 2020-05-26 VITALS — BP 117/78 | HR 81 | Temp 98.1°F | Ht 65.0 in | Wt 203.0 lb

## 2020-05-26 DIAGNOSIS — E669 Obesity, unspecified: Secondary | ICD-10-CM | POA: Diagnosis not present

## 2020-05-26 DIAGNOSIS — E038 Other specified hypothyroidism: Secondary | ICD-10-CM | POA: Diagnosis not present

## 2020-05-26 DIAGNOSIS — E559 Vitamin D deficiency, unspecified: Secondary | ICD-10-CM | POA: Diagnosis not present

## 2020-05-26 DIAGNOSIS — R7303 Prediabetes: Secondary | ICD-10-CM

## 2020-05-26 DIAGNOSIS — Z9189 Other specified personal risk factors, not elsewhere classified: Secondary | ICD-10-CM

## 2020-05-26 DIAGNOSIS — Z6833 Body mass index (BMI) 33.0-33.9, adult: Secondary | ICD-10-CM

## 2020-05-26 MED ORDER — VITAMIN D (ERGOCALCIFEROL) 1.25 MG (50000 UNIT) PO CAPS: 50000.0000 [IU] | ORAL_CAPSULE | ORAL | 1 refills | Status: DC

## 2020-05-26 MED ORDER — METFORMIN HCL 500 MG PO TABS: 500.0000 mg | ORAL_TABLET | Freq: Every day | ORAL | 1 refills | Status: DC

## 2020-05-26 NOTE — Progress Notes (Signed)
Chief Complaint:   OBESITY Nicole [name] is here to discuss her progress with her obesity treatment plan along with follow-up of her obesity related diagnoses. Nicole [name] is on keeping a food journal and adhering to recommended goals of 1500-1600 calories and 85+ grams of protein daily and states she is following her eating plan approximately 50% of the time. Nicole [name] states she is doing strengthening and walking for 30-40 minutes 1-3 times per week.  Today's visit was #: 12 Starting weight: 210 lbs Starting date: 09/26/2019 Today's weight: 203 lbs Today's date: 05/26/2020 Total lbs lost to date: 7 Total lbs lost since last in-office visit: 0  Interim History: Nicole [name] notes she is practicing "intuitive"eating. She has not been journaling. She notes foods that were previously tempting such as sweets are not tempting to her. She is no longer hiding food (usually sweets) to eat later. She is up 2 lbs today.  Subjective:   1. Pre-diabetes Mayerli's A1c is up to 6.1. She is able to tolerate 250 mg of metformin  daily. . She denies nausea or hypoglycemia.  Lab Results  Component Value Date   HGBA1C 6.1 (H) 04/16/2020   Lab Results  Component Value Date   INSULIN 18.4 04/16/2020   INSULIN 14.7 12/27/2019   INSULIN 14.8 09/26/2019   2. Other specified hypothyroidism Nicole [name]'s TSH is up to 2.240 from 1.530. she notes she feels better when her TSH is closer to 1. Her hypothyroidism is managed by Endocrinology. She notes fatigue and sluggishness.  Lab Results  Component Value Date   TSH 2.240 04/16/2020   3. Vitamin D deficiency Nicole [name]'s Vit D level is at goal. She is on weekly prescription Vit D.  4. At risk for diabetes mellitus Nicole [name] is at higher than average risk for developing diabetes due to her obesity and prediabetic status.   Assessment/Plan:   1. Pre-diabetes  We will refill metformin for 1 month with 1 refill, Nicole [name] may take 250 mg.  - metFORMIN (GLUCOPHAGE) 500 MG tablet; Take 1  tablet (500 mg total) by mouth daily with breakfast.  Dispense: 30 tablet; Refill: 1  2. Other specified hypothyroidism She will continue levothyroxine and will follow up with Endocrinology.   3. Vitamin D deficiency  We will refill prescription Vitamin D for 1 month, with 1 refill. Nicole [name] will follow-up for routine testing of Vitamin D, at least 2-3 times per year to avoid over-replacement.  - Vitamin D, Ergocalciferol, (DRISDOL) 1.25 MG (50000 UNIT) CAPS capsule; Take 1 capsule (50,000 Units total) by mouth every 7 (seven) days.  Dispense: 4 capsule; Refill: 1  4. At risk for diabetes mellitus Nicole [name] was given approximately 15 minutes of diabetes education and counseling today. We discussed intensive lifestyle modifications today with an emphasis on weight loss as well as increasing exercise and decreasing simple carbohydrates in her diet. We also reviewed medication options with an emphasis on risk versus benefit of those discussed.   Repetitive spaced learning was employed today to elicit superior memory formation and behavioral change.  5. Class 1 obesity with serious comorbidity and body mass index (BMI) of 33.0 to 33.9 in adult, unspecified obesity type Nicole [name] is currently in the action stage of change. As such, her goal is to continue with weight loss efforts. She has agreed to practicing portion control and making smarter food choices, such as increasing vegetables and decreasing simple carbohydrates.   Exercise goals: Increase cardio frequency to every other day.  Behavioral modification strategies: increasing lean protein intake  and decreasing simple carbohydrates.  Nicole [name] has agreed to follow-up with our clinic in 8 to 10 weeks per patient's request.  Objective:   Blood pressure 117/78, pulse 81, temperature 98.1 F (36.7 C), temperature source Oral, height 5\' 5"  (1.651 m), weight 203 lb (92.1 kg), SpO2 98 %. Body mass index is 33.78 kg/m.  General: Cooperative, alert, well  developed, in no acute distress. HEENT: Conjunctivae and lids unremarkable. Cardiovascular: Regular rhythm.  Lungs: Normal work of breathing. Neurologic: No focal deficits.   Lab Results  Component Value Date   CREATININE 0.71 04/16/2020   BUN 9 04/16/2020   NA 138 04/16/2020   K 4.5 04/16/2020   CL 100 04/16/2020   CO2 23 04/16/2020   Lab Results  Component Value Date   ALT 13 04/16/2020   AST 12 04/16/2020   ALKPHOS 75 04/16/2020   BILITOT 0.4 04/16/2020   Lab Results  Component Value Date   HGBA1C 6.1 (H) 04/16/2020   HGBA1C 5.8 (H) 12/27/2019   HGBA1C 6.1 (H) 09/26/2019   HGBA1C 6.0 (H) 09/21/2018   HGBA1C 6.0 (H) 05/31/2014   Lab Results  Component Value Date   INSULIN 18.4 04/16/2020   INSULIN 14.7 12/27/2019   INSULIN 14.8 09/26/2019   Lab Results  Component Value Date   TSH 2.240 04/16/2020   Lab Results  Component Value Date   CHOL 186 04/16/2020   HDL 39 (L) 04/16/2020   LDLCALC 112 (H) 04/16/2020   TRIG 198 (H) 04/16/2020   CHOLHDL 3.8 08/25/2018   Lab Results  Component Value Date   WBC 6.5 05/04/2019   HGB 13.0 05/04/2019   HCT 38.7 05/04/2019   MCV 91.5 05/04/2019   PLT 289 05/04/2019   No results found for: IRON, TIBC, FERRITIN  Attestation Statements:   Reviewed by clinician on day of visit: allergies, medications, problem list, medical history, surgical history, family history, social history, and previous encounter notes.   05/06/2019, am acting as Trude Mcburney for Energy manager, FNP-C.  I have reviewed the above documentation for accuracy and completeness, and I agree with the above. -  [place], FNP

## 2020-05-26 NOTE — Progress Notes (Signed)
The 10-year ASCVD risk score Denman George DC Montez Hageman., et al., 2013) is: 0.8%   Values used to calculate the score:     Age: 39 years     Sex: Female     Is Non-Hispanic African American: No     Diabetic: No     Tobacco smoker: No     Systolic Blood Pressure: 117 mmHg     Is BP treated: No     HDL Cholesterol: 39 mg/dL     Total Cholesterol: 186 mg/dL

## 2020-05-27 ENCOUNTER — Encounter (INDEPENDENT_AMBULATORY_CARE_PROVIDER_SITE_OTHER): Payer: Self-pay | Admitting: Family Medicine

## 2020-06-04 DIAGNOSIS — F424 Excoriation (skin-picking) disorder: Secondary | ICD-10-CM | POA: Diagnosis not present

## 2020-06-04 DIAGNOSIS — F902 Attention-deficit hyperactivity disorder, combined type: Secondary | ICD-10-CM | POA: Diagnosis not present

## 2020-06-04 DIAGNOSIS — F422 Mixed obsessional thoughts and acts: Secondary | ICD-10-CM | POA: Diagnosis not present

## 2020-06-04 DIAGNOSIS — F331 Major depressive disorder, recurrent, moderate: Secondary | ICD-10-CM | POA: Diagnosis not present

## 2020-06-06 DIAGNOSIS — E039 Hypothyroidism, unspecified: Secondary | ICD-10-CM | POA: Diagnosis not present

## 2020-07-07 DIAGNOSIS — F424 Excoriation (skin-picking) disorder: Secondary | ICD-10-CM | POA: Diagnosis not present

## 2020-07-07 DIAGNOSIS — F331 Major depressive disorder, recurrent, moderate: Secondary | ICD-10-CM | POA: Diagnosis not present

## 2020-07-07 DIAGNOSIS — F902 Attention-deficit hyperactivity disorder, combined type: Secondary | ICD-10-CM | POA: Diagnosis not present

## 2020-07-07 DIAGNOSIS — F422 Mixed obsessional thoughts and acts: Secondary | ICD-10-CM | POA: Diagnosis not present

## 2020-07-28 ENCOUNTER — Other Ambulatory Visit: Payer: Self-pay

## 2020-07-28 ENCOUNTER — Other Ambulatory Visit (INDEPENDENT_AMBULATORY_CARE_PROVIDER_SITE_OTHER): Payer: Self-pay | Admitting: Family Medicine

## 2020-07-28 ENCOUNTER — Encounter (INDEPENDENT_AMBULATORY_CARE_PROVIDER_SITE_OTHER): Payer: Self-pay | Admitting: Family Medicine

## 2020-07-28 ENCOUNTER — Ambulatory Visit (INDEPENDENT_AMBULATORY_CARE_PROVIDER_SITE_OTHER): Payer: BC Managed Care – PPO | Admitting: Family Medicine

## 2020-07-28 VITALS — BP 100/68 | HR 82 | Temp 97.5°F | Ht 65.0 in | Wt 203.0 lb

## 2020-07-28 DIAGNOSIS — R7303 Prediabetes: Secondary | ICD-10-CM

## 2020-07-28 DIAGNOSIS — E559 Vitamin D deficiency, unspecified: Secondary | ICD-10-CM

## 2020-07-28 DIAGNOSIS — E7849 Other hyperlipidemia: Secondary | ICD-10-CM | POA: Diagnosis not present

## 2020-07-28 DIAGNOSIS — Z6833 Body mass index (BMI) 33.0-33.9, adult: Secondary | ICD-10-CM

## 2020-07-28 DIAGNOSIS — E038 Other specified hypothyroidism: Secondary | ICD-10-CM | POA: Diagnosis not present

## 2020-07-28 DIAGNOSIS — Z9189 Other specified personal risk factors, not elsewhere classified: Secondary | ICD-10-CM | POA: Diagnosis not present

## 2020-07-28 DIAGNOSIS — E669 Obesity, unspecified: Secondary | ICD-10-CM

## 2020-07-28 MED ORDER — METFORMIN HCL 500 MG PO TABS: 500.0000 mg | ORAL_TABLET | Freq: Every day | ORAL | 1 refills | Status: DC

## 2020-07-28 NOTE — Telephone Encounter (Signed)
Last seen by Dawn  

## 2020-07-29 ENCOUNTER — Encounter (INDEPENDENT_AMBULATORY_CARE_PROVIDER_SITE_OTHER): Payer: Self-pay | Admitting: Family Medicine

## 2020-07-29 DIAGNOSIS — R7303 Prediabetes: Secondary | ICD-10-CM

## 2020-07-29 LAB — T3: T3, Total: 114 ng/dL (ref 71–180)

## 2020-07-29 LAB — COMPREHENSIVE METABOLIC PANEL
ALT: 14 IU/L (ref 0–32)
AST: 15 IU/L (ref 0–40)
Albumin/Globulin Ratio: 1.7 (ref 1.2–2.2)
Albumin: 4.3 g/dL (ref 3.8–4.8)
Alkaline Phosphatase: 66 IU/L (ref 44–121)
BUN/Creatinine Ratio: 13 (ref 9–23)
BUN: 8 mg/dL (ref 6–24)
Bilirubin Total: 0.3 mg/dL (ref 0.0–1.2)
CO2: 25 mmol/L (ref 20–29)
Calcium: 9.2 mg/dL (ref 8.7–10.2)
Chloride: 102 mmol/L (ref 96–106)
Creatinine, Ser: 0.61 mg/dL (ref 0.57–1.00)
GFR calc Af Amer: 131 mL/min/{1.73_m2} (ref >59)
GFR calc non Af Amer: 114 mL/min/{1.73_m2} (ref >59)
Globulin, Total: 2.6 g/dL (ref 1.5–4.5)
Glucose: 129 mg/dL — ABNORMAL HIGH (ref 65–99)
Potassium: 4.6 mmol/L (ref 3.5–5.2)
Sodium: 139 mmol/L (ref 134–144)
Total Protein: 6.9 g/dL (ref 6.0–8.5)

## 2020-07-29 LAB — TSH: TSH: 1.4 u[IU]/mL (ref 0.450–4.500)

## 2020-07-29 LAB — LIPID PANEL WITH LDL/HDL RATIO
Cholesterol, Total: 178 mg/dL (ref 100–199)
HDL: 42 mg/dL (ref >39)
LDL Chol Calc (NIH): 111 mg/dL — ABNORMAL HIGH (ref 0–99)
LDL/HDL Ratio: 2.6 ratio (ref 0.0–3.2)
Triglycerides: 138 mg/dL (ref 0–149)
VLDL Cholesterol Cal: 25 mg/dL (ref 5–40)

## 2020-07-29 LAB — HEMOGLOBIN A1C
Est. average glucose Bld gHb Est-mCnc: 137 mg/dL
Hgb A1c MFr Bld: 6.4 % — ABNORMAL HIGH (ref 4.8–5.6)

## 2020-07-29 LAB — INSULIN, RANDOM: INSULIN: 13.8 u[IU]/mL (ref 2.6–24.9)

## 2020-07-29 LAB — T4, FREE: Free T4: 1.48 ng/dL (ref 0.82–1.77)

## 2020-07-29 LAB — VITAMIN D 25 HYDROXY (VIT D DEFICIENCY, FRACTURES): Vit D, 25-Hydroxy: 75.9 ng/mL (ref 30.0–100.0)

## 2020-07-29 MED ORDER — METFORMIN HCL ER 500 MG PO TB24: 500.0000 mg | ORAL_TABLET | Freq: Every day | ORAL | 0 refills | Status: DC

## 2020-07-29 NOTE — Telephone Encounter (Signed)
Please advise 

## 2020-07-29 NOTE — Telephone Encounter (Signed)
[email or web address]

## 2020-07-29 NOTE — Progress Notes (Signed)
Chief Complaint:   OBESITY Nicole [name] is here to discuss her progress with her obesity treatment plan along with follow-up of her obesity related diagnoses. Nicole [name] is on practicing portion control and making smarter food choices, such as increasing vegetables and decreasing simple carbohydrates and states she is following her eating plan approximately 90% of the time. Nicole [name] states she is walking and doing weights for 30 minutes 3-4 times per week.  Today's visit was #: 13 Starting weight: 210 lbs Starting date: 09/26/2019 Today's weight: 201 lbs Today's date: 07/28/2020 Total lbs lost to date: 9 Total lbs lost since last in-office visit: 2  Interim History: Nicole [name] feels she is craving sweets less. She is having some sweets but able to control  Her portion size. She says she is not getting enough protein. Her Adderall was added by her Psychiatrist for likely ADHD and bupropion was discontinued.  She notes binge tendencies are much decreased.  Subjective:   1. Other hyperlipidemia Nicole [name] is not on statin. Last LDL was slightly elevated at 112, HDL was low at 39, and triglycerides within normal limits at 131. She denies any chest pain, claudication or myalgias.  Lab Results  Component Value Date   ALT 14 07/28/2020   AST 15 07/28/2020   ALKPHOS 66 07/28/2020   BILITOT 0.3 07/28/2020   Lab Results  Component Value Date   CHOL 178 07/28/2020   HDL 42 07/28/2020   LDLCALC 111 (H) 07/28/2020   TRIG 138 07/28/2020   CHOLHDL 3.8 08/25/2018   2. Pre-diabetes Nicole [name] denies excessive hunger or cravings. She only tolerates 250 mg of metformin daily. Last A1c was 6.1 (04/16/20).   Lab Results  Component Value Date   HGBA1C 6.4 (H) 07/28/2020   Lab Results  Component Value Date   INSULIN 13.8 07/28/2020   INSULIN 18.4 04/16/2020   INSULIN 14.7 12/27/2019   INSULIN 14.8 09/26/2019   3. Vitamin D deficiency Nicole [name] is on Vit D 50,000 IU weekly Vit D. Her last Vit D level was at goal at  50.  4. Other specified hypothyroidism Nicole [name]'s last TSH was 2.24. Her Endocrinologist increased her levothyroxine to 100 mcg daily week, and 50 mcg 6 days per week. She feels better with lower TSH.   Lab Results  Component Value Date   TSH 1.400 07/28/2020   5. At risk for diabetes mellitus Nicole [name] is at higher than average risk for developing diabetes due to obesity and A1c of 6.1.   Assessment/Plan:   1. Other hyperlipidemia  We will check labs today.  - Lipid Panel With LDL/HDL Ratio  2. Pre-diabetes We will check labs today, and we will refill metformin 500 mg q daily #30 for 1 month. She is currently taking 250 mg daily because she does not tolerate it well.   - Comprehensive metabolic panel - Hemoglobin A1c - Insulin, random  3. Vitamin D deficiency We will check labs today, and [place] will follow-up for routine testing of Vitamin D, at least 2-3 times per year to avoid over-replacement.  - VITAMIN D 25 Hydroxy (Vit-D Deficiency, Fractures)  4. Other specified hypothyroidism Nicole [name] requests that we check labs today since her dose of levothyroxine was increased by endocrinology the end of September. - T3 - T4, free - TSH  5. At risk for diabetes mellitus Nicole [name] was given approximately 15 minutes of diabetes education and counseling today. We discussed intensive lifestyle modifications today with an emphasis on weight loss as well as increasing exercise and  decreasing simple carbohydrates in her diet. We also reviewed medication options with an emphasis on risk versus benefit of those discussed.   Repetitive spaced learning was employed today to elicit superior memory formation and behavioral change.  6. Class 1 obesity with serious comorbidity and body mass index (BMI) of 33.0 to 33.9 in adult, unspecified obesity type Nicole [name] is currently in the action stage of change. As such, her goal is to continue with weight loss efforts. She has agreed to practicing portion control and  making smarter food choices, such as increasing vegetables and decreasing simple carbohydrates, 90 grams of protein daily.  Exercise goals: As is.  Behavioral modification strategies: increasing lean protein intake and increasing water intake.  Nicole [name] has agreed to follow-up with our clinic in 8 weeks per her request.  Nicole [name] was informed we would discuss her lab results at her next visit unless there is a critical issue that needs to be addressed sooner. Nicole [name] agreed to keep her next visit at the agreed upon time to discuss these results.  Objective:   Blood pressure 100/68, pulse 82, temperature (!) 97.5 F (36.4 C), height 5\' 5"  (1.651 m), weight 203 lb (92.1 kg), SpO2 99 %. Body mass index is 33.78 kg/m.  General: Cooperative, alert, well developed, in no acute distress. HEENT: Conjunctivae and lids unremarkable. Cardiovascular: Regular rhythm.  Lungs: Normal work of breathing. Neurologic: No focal deficits.   Lab Results  Component Value Date   CREATININE 0.61 07/28/2020   BUN 8 07/28/2020   NA 139 07/28/2020   K 4.6 07/28/2020   CL 102 07/28/2020   CO2 25 07/28/2020   Lab Results  Component Value Date   ALT 14 07/28/2020   AST 15 07/28/2020   ALKPHOS 66 07/28/2020   BILITOT 0.3 07/28/2020   Lab Results  Component Value Date   HGBA1C 6.4 (H) 07/28/2020   HGBA1C 6.1 (H) 04/16/2020   HGBA1C 5.8 (H) 12/27/2019   HGBA1C 6.1 (H) 09/26/2019   HGBA1C 6.0 (H) 09/21/2018   Lab Results  Component Value Date   INSULIN 13.8 07/28/2020   INSULIN 18.4 04/16/2020   INSULIN 14.7 12/27/2019   INSULIN 14.8 09/26/2019   Lab Results  Component Value Date   TSH 1.400 07/28/2020   Lab Results  Component Value Date   CHOL 178 07/28/2020   HDL 42 07/28/2020   LDLCALC 111 (H) 07/28/2020   TRIG 138 07/28/2020   CHOLHDL 3.8 08/25/2018   Lab Results  Component Value Date   WBC 6.5 05/04/2019   HGB 13.0 05/04/2019   HCT 38.7 05/04/2019   MCV 91.5 05/04/2019   PLT 289  05/04/2019   No results found for: IRON, TIBC, FERRITIN  Attestation Statements:   Reviewed by clinician on day of visit: allergies, medications, problem list, medical history, surgical history, family history, social history, and previous encounter notes.   05/06/2019, am acting as Trude Mcburney for Energy manager, FNP-C.  I have reviewed the above documentation for accuracy and completeness, and I agree with the above. -  [place], FNP

## 2020-07-30 ENCOUNTER — Encounter (INDEPENDENT_AMBULATORY_CARE_PROVIDER_SITE_OTHER): Payer: Self-pay | Admitting: Family Medicine

## 2020-08-04 DIAGNOSIS — F331 Major depressive disorder, recurrent, moderate: Secondary | ICD-10-CM | POA: Diagnosis not present

## 2020-08-04 DIAGNOSIS — F422 Mixed obsessional thoughts and acts: Secondary | ICD-10-CM | POA: Diagnosis not present

## 2020-08-04 DIAGNOSIS — F902 Attention-deficit hyperactivity disorder, combined type: Secondary | ICD-10-CM | POA: Diagnosis not present

## 2020-08-06 ENCOUNTER — Other Ambulatory Visit (INDEPENDENT_AMBULATORY_CARE_PROVIDER_SITE_OTHER): Payer: Self-pay | Admitting: Family Medicine

## 2020-08-06 DIAGNOSIS — E559 Vitamin D deficiency, unspecified: Secondary | ICD-10-CM

## 2020-08-11 ENCOUNTER — Encounter (INDEPENDENT_AMBULATORY_CARE_PROVIDER_SITE_OTHER): Payer: Self-pay | Admitting: Family Medicine

## 2020-08-11 DIAGNOSIS — E559 Vitamin D deficiency, unspecified: Secondary | ICD-10-CM

## 2020-08-11 MED ORDER — VITAMIN D 125 MCG (5000 UT) PO CAPS: 1.0000 | ORAL_CAPSULE | Freq: Every day | ORAL | 0 refills | Status: AC

## 2020-08-11 NOTE — Telephone Encounter (Signed)
Please advise 

## 2020-08-12 DIAGNOSIS — L821 Other seborrheic keratosis: Secondary | ICD-10-CM | POA: Diagnosis not present

## 2020-08-12 DIAGNOSIS — L814 Other melanin hyperpigmentation: Secondary | ICD-10-CM | POA: Diagnosis not present

## 2020-08-12 DIAGNOSIS — D225 Melanocytic nevi of trunk: Secondary | ICD-10-CM | POA: Diagnosis not present

## 2020-08-12 DIAGNOSIS — L578 Other skin changes due to chronic exposure to nonionizing radiation: Secondary | ICD-10-CM | POA: Diagnosis not present

## 2020-08-20 DIAGNOSIS — F422 Mixed obsessional thoughts and acts: Secondary | ICD-10-CM | POA: Diagnosis not present

## 2020-08-20 DIAGNOSIS — F331 Major depressive disorder, recurrent, moderate: Secondary | ICD-10-CM | POA: Diagnosis not present

## 2020-08-20 DIAGNOSIS — F902 Attention-deficit hyperactivity disorder, combined type: Secondary | ICD-10-CM | POA: Diagnosis not present

## 2020-08-28 DIAGNOSIS — E038 Other specified hypothyroidism: Secondary | ICD-10-CM | POA: Diagnosis not present

## 2020-09-29 ENCOUNTER — Telehealth (INDEPENDENT_AMBULATORY_CARE_PROVIDER_SITE_OTHER): Payer: BC Managed Care – PPO | Admitting: Family Medicine

## 2020-09-29 ENCOUNTER — Encounter (INDEPENDENT_AMBULATORY_CARE_PROVIDER_SITE_OTHER): Payer: Self-pay | Admitting: Family Medicine

## 2020-09-29 DIAGNOSIS — R7303 Prediabetes: Secondary | ICD-10-CM | POA: Diagnosis not present

## 2020-09-29 DIAGNOSIS — Z6833 Body mass index (BMI) 33.0-33.9, adult: Secondary | ICD-10-CM | POA: Diagnosis not present

## 2020-09-29 DIAGNOSIS — E669 Obesity, unspecified: Secondary | ICD-10-CM

## 2020-10-01 NOTE — Progress Notes (Signed)
TeleHealth Visit:  Due to the COVID-19 pandemic, this visit was completed with telemedicine (audio/video) technology to reduce patient and provider exposure as well as to preserve personal protective equipment.   Nicole [name] has verbally consented to this TeleHealth visit. The patient is located at home, the provider is located at the [facility] office. The participants in this visit include the listed provider and patient. The visit was conducted today via MyChart Video.     Chief Complaint: OBESITY Nicole [name] is here to discuss her progress with her obesity treatment plan along with follow-up of her obesity related diagnoses. Nicole [name] is on practicing portion control and making smarter food choices, such as increasing vegetables and decreasing simple carbohydrates and states she is following her eating plan approximately 70% of the time. Nicole [name] states she is not exercising.  Today's visit was #: 14 Starting weight: 210 lbs Starting date: 09/26/2019  Interim History: Nicole [name]'s last office visit was 07/28/2020. Weight was 203 lbs at last visit. She notes weight is 199 lbs at home a few days ago. Nicole [name] is eating 3 meals per day and having protein with all meals. She reports eating better over the holidays than she ever has. She was able to eat sensibly.  Subjective:    1. Pre-diabetes  Nicole [name]'s last A1c was 6.4. She has not been taking metformin XR due to upset stomach and diarrhea.   Lab Results  Component Value Date   HGBA1C 6.4 (H) 07/28/2020   Lab Results  Component Value Date   INSULIN 13.8 07/28/2020   INSULIN 18.4 04/16/2020   INSULIN 14.7 12/27/2019   INSULIN 14.8 09/26/2019                                                                                         Assessment/Plan:   1. Pre-diabetes . Nicole [name] will try to take 1/2 of the 500 mg metformin prescription daily. We will check A1c at next office visit.  Class 1 obesity with serious comorbidity and body  mass index (BMI) of 33.0 to 33.9 in adult, unspecified obesity type Nicole [name] is currently in the action stage of change. As such, her goal is to continue with weight loss efforts. She has agreed to practicing portion control and making smarter food choices, such as increasing vegetables and decreasing simple carbohydrates.   Nicole [name] will work on cutting down simple carbs.   Exercise goals: Some exercise.  Behavioral modification strategies: increasing lean protein intake, decreasing simple carbohydrates and meal planning and cooking strategies.  Nicole [name] has agreed to follow-up with our clinic in 2 months per patient request.   Objective:   VITALS: Per patient if applicable, see vitals. GENERAL: Alert and in no acute distress. CARDIOPULMONARY: No increased WOB. Speaking in clear sentences.  PSYCH: Pleasant and cooperative. Speech normal rate and rhythm. Affect is appropriate. Insight and judgement are appropriate. Attention is focused, linear, and appropriate.  NEURO: Oriented as arrived to appointment on time with no prompting.   Lab Results  Component Value Date   CREATININE 0.61 07/28/2020   BUN 8 07/28/2020   NA 139 07/28/2020   K 4.6 07/28/2020  CL 102 07/28/2020   CO2 25 07/28/2020   Lab Results  Component Value Date   ALT 14 07/28/2020   AST 15 07/28/2020   ALKPHOS 66 07/28/2020   BILITOT 0.3 07/28/2020   Lab Results  Component Value Date   HGBA1C 6.4 (H) 07/28/2020   HGBA1C 6.1 (H) 04/16/2020   HGBA1C 5.8 (H) 12/27/2019   HGBA1C 6.1 (H) 09/26/2019   HGBA1C 6.0 (H) 09/21/2018   Lab Results  Component Value Date   INSULIN 13.8 07/28/2020   INSULIN 18.4 04/16/2020   INSULIN 14.7 12/27/2019   INSULIN 14.8 09/26/2019   Lab Results  Component Value Date   TSH 1.400 07/28/2020   Lab Results  Component Value Date   CHOL 178 07/28/2020   HDL 42 07/28/2020   LDLCALC 111 (H) 07/28/2020   TRIG 138 07/28/2020   CHOLHDL 3.8 08/25/2018   Lab Results  Component Value  Date   WBC 6.5 05/04/2019   HGB 13.0 05/04/2019   HCT 38.7 05/04/2019   MCV 91.5 05/04/2019   PLT 289 05/04/2019   No results found for: IRON, TIBC, FERRITIN  Attestation Statements:   Reviewed by clinician on day of visit: allergies, medications, problem list, medical history, surgical history, family history, social history, and previous encounter notes.   Felecia Jan, am acting as Energy manager for Jesse Sans, FNP.  I have reviewed the above documentation for accuracy and completeness, and I agree with the above. - Jesse Sans, FNP

## 2020-11-03 DIAGNOSIS — E039 Hypothyroidism, unspecified: Secondary | ICD-10-CM | POA: Diagnosis not present

## 2020-11-24 ENCOUNTER — Ambulatory Visit (INDEPENDENT_AMBULATORY_CARE_PROVIDER_SITE_OTHER): Payer: Self-pay | Admitting: Family Medicine

## 2020-12-09 ENCOUNTER — Other Ambulatory Visit: Payer: Self-pay

## 2020-12-09 ENCOUNTER — Ambulatory Visit (INDEPENDENT_AMBULATORY_CARE_PROVIDER_SITE_OTHER): Payer: BC Managed Care – PPO | Admitting: Family Medicine

## 2020-12-09 ENCOUNTER — Encounter (INDEPENDENT_AMBULATORY_CARE_PROVIDER_SITE_OTHER): Payer: Self-pay | Admitting: Family Medicine

## 2020-12-09 VITALS — BP 108/72 | HR 86 | Temp 98.1°F | Ht 65.0 in | Wt 201.0 lb

## 2020-12-09 DIAGNOSIS — Z9189 Other specified personal risk factors, not elsewhere classified: Secondary | ICD-10-CM

## 2020-12-09 DIAGNOSIS — E559 Vitamin D deficiency, unspecified: Secondary | ICD-10-CM | POA: Diagnosis not present

## 2020-12-09 DIAGNOSIS — E669 Obesity, unspecified: Secondary | ICD-10-CM | POA: Diagnosis not present

## 2020-12-09 DIAGNOSIS — R7303 Prediabetes: Secondary | ICD-10-CM

## 2020-12-09 DIAGNOSIS — Z6834 Body mass index (BMI) 34.0-34.9, adult: Secondary | ICD-10-CM

## 2020-12-09 DIAGNOSIS — E7849 Other hyperlipidemia: Secondary | ICD-10-CM

## 2020-12-10 LAB — COMPREHENSIVE METABOLIC PANEL
ALT: 10 IU/L (ref 0–32)
AST: 13 IU/L (ref 0–40)
Albumin/Globulin Ratio: 1.8 (ref 1.2–2.2)
Albumin: 4.4 g/dL (ref 3.8–4.8)
Alkaline Phosphatase: 78 IU/L (ref 44–121)
BUN/Creatinine Ratio: 14 (ref 9–23)
BUN: 8 mg/dL (ref 6–24)
Bilirubin Total: 0.5 mg/dL (ref 0.0–1.2)
CO2: 22 mmol/L (ref 20–29)
Calcium: 9.2 mg/dL (ref 8.7–10.2)
Chloride: 102 mmol/L (ref 96–106)
Creatinine, Ser: 0.59 mg/dL (ref 0.57–1.00)
Globulin, Total: 2.4 g/dL (ref 1.5–4.5)
Glucose: 137 mg/dL — ABNORMAL HIGH (ref 65–99)
Potassium: 4.4 mmol/L (ref 3.5–5.2)
Sodium: 140 mmol/L (ref 134–144)
Total Protein: 6.8 g/dL (ref 6.0–8.5)
eGFR: 117 mL/min/{1.73_m2} (ref >59)

## 2020-12-10 LAB — VITAMIN D 25 HYDROXY (VIT D DEFICIENCY, FRACTURES): Vit D, 25-Hydroxy: 38.1 ng/mL (ref 30.0–100.0)

## 2020-12-10 LAB — LIPID PANEL WITH LDL/HDL RATIO
Cholesterol, Total: 176 mg/dL (ref 100–199)
HDL: 39 mg/dL — ABNORMAL LOW (ref >39)
LDL Chol Calc (NIH): 111 mg/dL — ABNORMAL HIGH (ref 0–99)
LDL/HDL Ratio: 2.8 ratio (ref 0.0–3.2)
Triglycerides: 146 mg/dL (ref 0–149)
VLDL Cholesterol Cal: 26 mg/dL (ref 5–40)

## 2020-12-10 LAB — HEMOGLOBIN A1C
Est. average glucose Bld gHb Est-mCnc: 140 mg/dL
Hgb A1c MFr Bld: 6.5 % — ABNORMAL HIGH (ref 4.8–5.6)

## 2020-12-10 LAB — INSULIN, RANDOM: INSULIN: 15.4 u[IU]/mL (ref 2.6–24.9)

## 2020-12-11 DIAGNOSIS — F902 Attention-deficit hyperactivity disorder, combined type: Secondary | ICD-10-CM | POA: Diagnosis not present

## 2020-12-11 DIAGNOSIS — F422 Mixed obsessional thoughts and acts: Secondary | ICD-10-CM | POA: Diagnosis not present

## 2020-12-11 DIAGNOSIS — F331 Major depressive disorder, recurrent, moderate: Secondary | ICD-10-CM | POA: Diagnosis not present

## 2020-12-16 ENCOUNTER — Encounter (INDEPENDENT_AMBULATORY_CARE_PROVIDER_SITE_OTHER): Payer: Self-pay | Admitting: Family Medicine

## 2020-12-16 NOTE — Progress Notes (Signed)
Chief Complaint:   OBESITY Nicole [name] is here to discuss her progress with her obesity treatment plan along with follow-up of her obesity related diagnoses. Nicole [name] is on practicing portion control and making smarter food choices, such as increasing vegetables and decreasing simple carbohydrates and states she is following her eating plan approximately 80% of the time. Nicole [name] states she is walking for 30 minutes 1-2 times per week.  Today's visit was #: 15 Starting weight: 210 lbs Starting date: 09/26/2019 Today's weight: 201 lbs Today's date: 12/09/2020 Total lbs lost to date: 9 lbs Total lbs lost since last in-office visit: 2 lbs  Interim History: Nicole [name] has not been into the office since November, but is down 2 pounds.  She notes she is not eating enough protein at breakfast.  She tends to have [place] yogurt mixed with cottage cheese.  They tend to eat out quite a lot at supper, especially [name].  Denies binge type eating.    Subjective:   1. Pre-diabetes She has not been taking the metformin.  Last A1c was 6.4.  She has had some GI issues with it. 2. Vitamin D deficiency She is sporadic with taking vitamin D.  Endorses fatigue.  Last vitamin D at goal (75).  3. Other hyperlipidemia Last LDL elevated at 111.  HDL low at 42.  TG within normal limits.  She is not on a statin.  Lab Results  Component Value Date   ALT 10 12/09/2020   AST 13 12/09/2020   ALKPHOS 78 12/09/2020   BILITOT 0.5 12/09/2020   Lab Results  Component Value Date   CHOL 176 12/09/2020   HDL 39 (L) 12/09/2020   LDLCALC 111 (H) 12/09/2020   TRIG 146 12/09/2020   CHOLHDL 3.8 08/25/2018   4. At risk for malnutrition Nicole [name] is at increased risk for malnutrition due to inadequate protein intake.  Assessment/Plan:   1. Pre-diabetes She will try to take 250 mg of metformin daily.  Check A1c, fasting glucose, and insulin level today.  Decrease carbohydrates in diet.  - Comprehensive metabolic panel -  Hemoglobin A1c - Insulin, random  2. Vitamin D deficiency Take OTC vitamin D 5,000 IU daily.  Check vitamin D level today, as per below.  - VITAMIN D 25 Hydroxy (Vit-D Deficiency, Fractures)  3. Other hyperlipidemia Will check FLP today, as per below.  - Lipid Panel With LDL/HDL Ratio  4. At risk for malnutrition Nicole [name] was given approximately 15 minutes of counseling today regarding prevention of malnutrition and ways to meet macronutrient goals.  5. Obesity: BMI 33.4  Nicole [name] is currently in the action stage of change. As such, her goal is to continue with weight loss efforts. She has agreed to practicing portion control and making smarter food choices, such as increasing vegetables and decreasing simple carbohydrates with 80 grams of protein.   Exercise goals: For substantial health benefits, adults should do at least 150 minutes (2 hours and 30 minutes) a week of moderate-intensity, or 75 minutes (1 hour and 15 minutes) a week of vigorous-intensity aerobic physical activity, or an equivalent combination of moderate- and vigorous-intensity aerobic activity. Aerobic activity should be performed in episodes of at least 10 minutes, and preferably, it should be spread throughout the week.  Behavioral modification strategies: increasing lean protein intake, decreasing simple carbohydrates, decreasing eating out and meal planning and cooking strategies.  Nicole [name] has agreed to follow-up with our clinic in 8-9 weeks (patient request).  Nicole [name] was informed we would discuss  her lab results at her next visit unless there is a critical issue that needs to be addressed sooner. Nicole [name] agreed to keep her next visit at the agreed upon time to discuss these results.  Objective:   Blood pressure 108/72, pulse 86, temperature 98.1 F (36.7 C), height 5\' 5"  (1.651 m), weight 201 lb (91.2 kg), SpO2 99 %. Body mass index is 33.45 kg/m.  General: Cooperative, alert, well developed, in no acute  distress. HEENT: Conjunctivae and lids unremarkable. Cardiovascular: Regular rhythm.  Lungs: Normal work of breathing. Neurologic: No focal deficits.   Lab Results  Component Value Date   CREATININE 0.59 12/09/2020   BUN 8 12/09/2020   NA 140 12/09/2020   K 4.4 12/09/2020   CL 102 12/09/2020   CO2 22 12/09/2020   Lab Results  Component Value Date   ALT 10 12/09/2020   AST 13 12/09/2020   ALKPHOS 78 12/09/2020   BILITOT 0.5 12/09/2020   Lab Results  Component Value Date   HGBA1C 6.5 (H) 12/09/2020   HGBA1C 6.4 (H) 07/28/2020   HGBA1C 6.1 (H) 04/16/2020   HGBA1C 5.8 (H) 12/27/2019   HGBA1C 6.1 (H) 09/26/2019   Lab Results  Component Value Date   INSULIN 15.4 12/09/2020   INSULIN 13.8 07/28/2020   INSULIN 18.4 04/16/2020   INSULIN 14.7 12/27/2019   INSULIN 14.8 09/26/2019   Lab Results  Component Value Date   TSH 1.400 07/28/2020   Lab Results  Component Value Date   CHOL 176 12/09/2020   HDL 39 (L) 12/09/2020   LDLCALC 111 (H) 12/09/2020   TRIG 146 12/09/2020   CHOLHDL 3.8 08/25/2018   Lab Results  Component Value Date   WBC 6.5 05/04/2019   HGB 13.0 05/04/2019   HCT 38.7 05/04/2019   MCV 91.5 05/04/2019   PLT 289 05/04/2019   Attestation Statements:   Reviewed by clinician on day of visit: allergies, medications, problem list, medical history, surgical history, family history, social history, and previous encounter notes.  I, 05/06/2019, CMA, am acting as Insurance claims handler for Energy manager, FNP.  I have reviewed the above documentation for accuracy and completeness, and I agree with the above. -  [place], FNP

## 2020-12-17 DIAGNOSIS — F909 Attention-deficit hyperactivity disorder, unspecified type: Secondary | ICD-10-CM | POA: Diagnosis not present

## 2020-12-18 ENCOUNTER — Encounter: Payer: Self-pay | Admitting: Family Medicine

## 2020-12-24 DIAGNOSIS — F909 Attention-deficit hyperactivity disorder, unspecified type: Secondary | ICD-10-CM | POA: Diagnosis not present

## 2020-12-29 DIAGNOSIS — F909 Attention-deficit hyperactivity disorder, unspecified type: Secondary | ICD-10-CM | POA: Diagnosis not present

## 2020-12-31 DIAGNOSIS — F909 Attention-deficit hyperactivity disorder, unspecified type: Secondary | ICD-10-CM | POA: Diagnosis not present

## 2021-01-05 DIAGNOSIS — F909 Attention-deficit hyperactivity disorder, unspecified type: Secondary | ICD-10-CM | POA: Diagnosis not present

## 2021-01-08 DIAGNOSIS — F331 Major depressive disorder, recurrent, moderate: Secondary | ICD-10-CM | POA: Diagnosis not present

## 2021-01-08 DIAGNOSIS — F422 Mixed obsessional thoughts and acts: Secondary | ICD-10-CM | POA: Diagnosis not present

## 2021-01-08 DIAGNOSIS — F902 Attention-deficit hyperactivity disorder, combined type: Secondary | ICD-10-CM | POA: Diagnosis not present

## 2021-01-19 DIAGNOSIS — F902 Attention-deficit hyperactivity disorder, combined type: Secondary | ICD-10-CM | POA: Diagnosis not present

## 2021-01-19 DIAGNOSIS — F331 Major depressive disorder, recurrent, moderate: Secondary | ICD-10-CM | POA: Diagnosis not present

## 2021-01-19 DIAGNOSIS — F909 Attention-deficit hyperactivity disorder, unspecified type: Secondary | ICD-10-CM | POA: Diagnosis not present

## 2021-01-19 DIAGNOSIS — F422 Mixed obsessional thoughts and acts: Secondary | ICD-10-CM | POA: Diagnosis not present

## 2021-01-23 ENCOUNTER — Telehealth: Payer: Self-pay | Admitting: *Deleted

## 2021-01-23 MED ORDER — HEPATITIS A-HEP B RECOMB VAC 720-20 ELU-MCG/ML IM SUSY: 1.0000 mL | PREFILLED_SYRINGE | Freq: Once | INTRAMUSCULAR | 2 refills | Status: AC

## 2021-01-23 MED ORDER — BOOSTRIX 5-2.5-18.5 LF-MCG/0.5 IM SUSP: 0.5000 mL | Freq: Once | INTRAMUSCULAR | 0 refills | Status: AC

## 2021-01-23 NOTE — Telephone Encounter (Signed)
Patient requiring immunization with Twinrix/ Boostrix for trip out of the country.   Prescriptions sent to [facility] per patient request.

## 2021-01-26 DIAGNOSIS — Z8349 Family history of other endocrine, nutritional and metabolic diseases: Secondary | ICD-10-CM | POA: Diagnosis not present

## 2021-01-26 DIAGNOSIS — E039 Hypothyroidism, unspecified: Secondary | ICD-10-CM | POA: Diagnosis not present

## 2021-01-27 DIAGNOSIS — F909 Attention-deficit hyperactivity disorder, unspecified type: Secondary | ICD-10-CM | POA: Diagnosis not present

## 2021-02-03 ENCOUNTER — Encounter (INDEPENDENT_AMBULATORY_CARE_PROVIDER_SITE_OTHER): Payer: Self-pay | Admitting: Family Medicine

## 2021-02-03 ENCOUNTER — Ambulatory Visit (INDEPENDENT_AMBULATORY_CARE_PROVIDER_SITE_OTHER): Payer: BC Managed Care – PPO | Admitting: Family Medicine

## 2021-02-03 DIAGNOSIS — E559 Vitamin D deficiency, unspecified: Secondary | ICD-10-CM

## 2021-02-03 DIAGNOSIS — E1169 Type 2 diabetes mellitus with other specified complication: Secondary | ICD-10-CM

## 2021-02-11 DIAGNOSIS — F909 Attention-deficit hyperactivity disorder, unspecified type: Secondary | ICD-10-CM | POA: Diagnosis not present

## 2021-02-16 DIAGNOSIS — F909 Attention-deficit hyperactivity disorder, unspecified type: Secondary | ICD-10-CM | POA: Diagnosis not present

## 2021-02-18 DIAGNOSIS — F902 Attention-deficit hyperactivity disorder, combined type: Secondary | ICD-10-CM | POA: Diagnosis not present

## 2021-02-18 DIAGNOSIS — F422 Mixed obsessional thoughts and acts: Secondary | ICD-10-CM | POA: Diagnosis not present

## 2021-02-18 DIAGNOSIS — F331 Major depressive disorder, recurrent, moderate: Secondary | ICD-10-CM | POA: Diagnosis not present

## 2021-02-24 DIAGNOSIS — F909 Attention-deficit hyperactivity disorder, unspecified type: Secondary | ICD-10-CM | POA: Diagnosis not present

## 2021-03-10 DIAGNOSIS — F909 Attention-deficit hyperactivity disorder, unspecified type: Secondary | ICD-10-CM | POA: Diagnosis not present

## 2021-03-17 ENCOUNTER — Ambulatory Visit (INDEPENDENT_AMBULATORY_CARE_PROVIDER_SITE_OTHER): Payer: BC Managed Care – PPO | Admitting: Family Medicine

## 2021-03-17 ENCOUNTER — Encounter (INDEPENDENT_AMBULATORY_CARE_PROVIDER_SITE_OTHER): Payer: Self-pay | Admitting: Family Medicine

## 2021-03-17 ENCOUNTER — Other Ambulatory Visit: Payer: Self-pay

## 2021-03-17 VITALS — BP 103/71 | HR 84 | Temp 97.9°F | Ht 65.0 in | Wt 193.0 lb

## 2021-03-17 DIAGNOSIS — E559 Vitamin D deficiency, unspecified: Secondary | ICD-10-CM

## 2021-03-17 DIAGNOSIS — E1169 Type 2 diabetes mellitus with other specified complication: Secondary | ICD-10-CM

## 2021-03-17 DIAGNOSIS — Z9189 Other specified personal risk factors, not elsewhere classified: Secondary | ICD-10-CM | POA: Diagnosis not present

## 2021-03-17 DIAGNOSIS — E669 Obesity, unspecified: Secondary | ICD-10-CM

## 2021-03-17 DIAGNOSIS — Z5320 Procedure and treatment not carried out because of patient's decision for unspecified reasons: Secondary | ICD-10-CM | POA: Diagnosis not present

## 2021-03-17 DIAGNOSIS — E7849 Other hyperlipidemia: Secondary | ICD-10-CM

## 2021-03-17 DIAGNOSIS — Z6834 Body mass index (BMI) 34.0-34.9, adult: Secondary | ICD-10-CM

## 2021-03-18 DIAGNOSIS — F902 Attention-deficit hyperactivity disorder, combined type: Secondary | ICD-10-CM | POA: Diagnosis not present

## 2021-03-18 DIAGNOSIS — F422 Mixed obsessional thoughts and acts: Secondary | ICD-10-CM | POA: Diagnosis not present

## 2021-03-18 DIAGNOSIS — F331 Major depressive disorder, recurrent, moderate: Secondary | ICD-10-CM | POA: Diagnosis not present

## 2021-03-18 LAB — LIPID PANEL WITH LDL/HDL RATIO
Cholesterol, Total: 173 mg/dL (ref 100–199)
HDL: 39 mg/dL — ABNORMAL LOW (ref >39)
LDL Chol Calc (NIH): 105 mg/dL — ABNORMAL HIGH (ref 0–99)
LDL/HDL Ratio: 2.7 ratio (ref 0.0–3.2)
Triglycerides: 165 mg/dL — ABNORMAL HIGH (ref 0–149)
VLDL Cholesterol Cal: 29 mg/dL (ref 5–40)

## 2021-03-18 LAB — HEMOGLOBIN A1C
Est. average glucose Bld gHb Est-mCnc: 148 mg/dL
Hgb A1c MFr Bld: 6.8 % — ABNORMAL HIGH (ref 4.8–5.6)

## 2021-03-18 LAB — VITAMIN D 25 HYDROXY (VIT D DEFICIENCY, FRACTURES): Vit D, 25-Hydroxy: 44.8 ng/mL (ref 30.0–100.0)

## 2021-03-19 DIAGNOSIS — F909 Attention-deficit hyperactivity disorder, unspecified type: Secondary | ICD-10-CM | POA: Diagnosis not present

## 2021-03-23 DIAGNOSIS — E119 Type 2 diabetes mellitus without complications: Secondary | ICD-10-CM | POA: Insufficient documentation

## 2021-03-23 NOTE — Progress Notes (Signed)
Chief Complaint:   OBESITY Nicole [name] is here to discuss her progress with her obesity treatment plan along with follow-up of her obesity related diagnoses. Nicole [name] is on practicing portion control and making smarter food choices, such as increasing vegetables and decreasing simple carbohydrates with 80 g protein and states she is following her eating plan approximately 80% of the time. Nicole [name] states she is not currently exercising.  Today's visit was #: 16 Starting weight: 210 lbs Starting date: 09/26/2019 Today's weight: 193 lbs Today's date: 03/17/2021 Total lbs lost to date: 17 Total lbs lost since last in-office visit: 8  Interim History: Nicole [name] just got back from [place]. She was on a mission trip. She has increase her protein and notes family is eating out less. Her appetite is well controlled. She is on Concerta for ADHD, which decreases her appetite. She just started Concerta last month.   Subjective:   1. Type 2 diabetes mellitus with other specified complication, without long-term current use of insulin [facility] Recently diagnosed (March 2022). Nicole [name]'s A1c was 6.5. Her compliance with Metformin is spotty. She is able to tolerate 250 mg of Metformin tablet.   Lab Results  Component Value Date   HGBA1C 6.8 (H) 03/17/2021   HGBA1C 6.5 (H) 12/09/2020   HGBA1C 6.4 (H) 07/28/2020   Lab Results  Component Value Date   LDLCALC 105 (H) 03/17/2021   CREATININE 0.59 12/09/2020   Lab Results  Component Value Date   INSULIN 15.4 12/09/2020   INSULIN 13.8 07/28/2020   INSULIN 18.4 04/16/2020   INSULIN 14.7 12/27/2019   INSULIN 14.8 09/26/2019   2. Other hyperlipidemia Nicole [name]'s last LDL was elevated at 111. Her HDL was low at 39 and triglycerides was WNL. We discussed need for statin.  Lab Results  Component Value Date   ALT 10 12/09/2020   AST 13 12/09/2020   ALKPHOS 78 12/09/2020   BILITOT 0.5 12/09/2020   Lab Results  Component Value Date   CHOL 173 03/17/2021   HDL 39  (L) 03/17/2021   LDLCALC 105 (H) 03/17/2021   TRIG 165 (H) 03/17/2021   CHOLHDL 3.8 08/25/2018   3. Statin declined Nicole [name]'s last LDL was elevated at 111. Her HDL was low at 39 and triglycerides was WNL. We discussed need for statin and she declined.  4. Vitamin D deficiency Nicole [name]'s Vit D is low at 38.1. She is currently taking OTC vitamin D 5,000 IU each day.   Lab Results  Component Value Date   VD25OH 44.8 03/17/2021   VD25OH 38.1 12/09/2020   VD25OH 75.9 07/28/2020   5. At risk for heart disease Nicole [name] is at a higher than average risk for cardiovascular disease due to obesity, diabetes mellitus, HLD, and not being on a statin.   Assessment/Plan:   1. Type 2 diabetes mellitus with other specified complication, without long-term current use of insulin (HCC) Check labs today. Nicole [name] will try to be more compliant with Metformin 250 mg QD.  - Hemoglobin A1c  2. Other hyperlipidemia Check labs today. Pt declined statin therapy.  - Lipid Panel With LDL/HDL Ratio  3. Statin declined Pt declined statin therapy.  4. Vitamin D deficiency Check labs today. Continue OTC Vit D 5,000 IU QD.  - VITAMIN D 25 Hydroxy (Vit-D Deficiency, Fractures)  5. At risk for heart disease Nicole [name] was given approximately 15 minutes of coronary artery disease prevention counseling today. She is 40 y.o. female and has risk factors for heart disease including obesity. We discussed  intensive lifestyle modifications today with an emphasis on specific weight loss instructions and strategies.   Repetitive spaced learning was employed today to elicit superior memory formation and behavioral change.  6. Obesity: BMI 32.12  Nicole [name] is currently in the action stage of change. As such, her goal is to continue with weight loss efforts. She has agreed to practicing portion control and making smarter food choices, such as increasing vegetables and decreasing simple carbohydrates- 80 g protein.   Exercise goals: All  adults should avoid inactivity. Some physical activity is better than none, and adults who participate in any amount of physical activity gain some health benefits.  Behavioral modification strategies: increasing lean protein intake and decreasing simple carbohydrates.  Nicole [name] has agreed to follow-up with our clinic in 8 weeks- per pt request.  Objective:   Blood pressure 103/71, pulse 84, temperature 97.9 F (36.6 C), height 5\' 5"  (1.651 m), weight 193 lb (87.5 kg), SpO2 98 %. Body mass index is 32.12 kg/m.  General: Cooperative, alert, well developed, in no acute distress. HEENT: Conjunctivae and lids unremarkable. Cardiovascular: Regular rhythm.  Lungs: Normal work of breathing. Neurologic: No focal deficits.   Lab Results  Component Value Date   CREATININE 0.59 12/09/2020   BUN 8 12/09/2020   NA 140 12/09/2020   K 4.4 12/09/2020   CL 102 12/09/2020   CO2 22 12/09/2020   Lab Results  Component Value Date   ALT 10 12/09/2020   AST 13 12/09/2020   ALKPHOS 78 12/09/2020   BILITOT 0.5 12/09/2020   Lab Results  Component Value Date   HGBA1C 6.8 (H) 03/17/2021   HGBA1C 6.5 (H) 12/09/2020   HGBA1C 6.4 (H) 07/28/2020   HGBA1C 6.1 (H) 04/16/2020   HGBA1C 5.8 (H) 12/27/2019   Lab Results  Component Value Date   INSULIN 15.4 12/09/2020   INSULIN 13.8 07/28/2020   INSULIN 18.4 04/16/2020   INSULIN 14.7 12/27/2019   INSULIN 14.8 09/26/2019   Lab Results  Component Value Date   TSH 1.400 07/28/2020   Lab Results  Component Value Date   CHOL 173 03/17/2021   HDL 39 (L) 03/17/2021   LDLCALC 105 (H) 03/17/2021   TRIG 165 (H) 03/17/2021   CHOLHDL 3.8 08/25/2018   Lab Results  Component Value Date   VD25OH 44.8 03/17/2021   VD25OH 38.1 12/09/2020   VD25OH 75.9 07/28/2020   Lab Results  Component Value Date   WBC 6.5 05/04/2019   HGB 13.0 05/04/2019   HCT 38.7 05/04/2019   MCV 91.5 05/04/2019   PLT 289 05/04/2019    Attestation Statements:   Reviewed by  clinician on day of visit: allergies, medications, problem list, medical history, surgical history, family history, social history, and previous encounter notes.  05/06/2019, CMA, am acting as Edmund Hilda for Energy manager, FNP.  I have reviewed the above documentation for accuracy and completeness, and I agree with the above. -  [place], FNP

## 2021-03-24 DIAGNOSIS — F909 Attention-deficit hyperactivity disorder, unspecified type: Secondary | ICD-10-CM | POA: Diagnosis not present

## 2021-03-25 ENCOUNTER — Other Ambulatory Visit: Payer: Self-pay | Admitting: Family Medicine

## 2021-03-25 DIAGNOSIS — Z1231 Encounter for screening mammogram for malignant neoplasm of breast: Secondary | ICD-10-CM

## 2021-03-30 ENCOUNTER — Ambulatory Visit
Admission: RE | Admit: 2021-03-30 | Discharge: 2021-03-30 | Disposition: A | Discharge status: Home / Self Care | Payer: BC Managed Care – PPO | Source: Ambulatory Visit

## 2021-03-30 ENCOUNTER — Other Ambulatory Visit: Payer: Self-pay

## 2021-03-30 DIAGNOSIS — Z1231 Encounter for screening mammogram for malignant neoplasm of breast: Secondary | ICD-10-CM

## 2021-03-31 DIAGNOSIS — F909 Attention-deficit hyperactivity disorder, unspecified type: Secondary | ICD-10-CM | POA: Diagnosis not present

## 2021-04-08 DIAGNOSIS — F909 Attention-deficit hyperactivity disorder, unspecified type: Secondary | ICD-10-CM | POA: Diagnosis not present

## 2021-04-13 DIAGNOSIS — F909 Attention-deficit hyperactivity disorder, unspecified type: Secondary | ICD-10-CM | POA: Diagnosis not present

## 2021-04-20 DIAGNOSIS — F909 Attention-deficit hyperactivity disorder, unspecified type: Secondary | ICD-10-CM | POA: Diagnosis not present

## 2021-04-27 DIAGNOSIS — F909 Attention-deficit hyperactivity disorder, unspecified type: Secondary | ICD-10-CM | POA: Diagnosis not present

## 2021-05-12 DIAGNOSIS — F909 Attention-deficit hyperactivity disorder, unspecified type: Secondary | ICD-10-CM | POA: Diagnosis not present

## 2021-05-13 ENCOUNTER — Ambulatory Visit (INDEPENDENT_AMBULATORY_CARE_PROVIDER_SITE_OTHER): Payer: BC Managed Care – PPO | Admitting: Family Medicine

## 2021-05-21 DIAGNOSIS — F909 Attention-deficit hyperactivity disorder, unspecified type: Secondary | ICD-10-CM | POA: Diagnosis not present

## 2021-05-25 ENCOUNTER — Ambulatory Visit (INDEPENDENT_AMBULATORY_CARE_PROVIDER_SITE_OTHER): Payer: BC Managed Care – PPO | Admitting: Physician Assistant

## 2021-05-27 DIAGNOSIS — F909 Attention-deficit hyperactivity disorder, unspecified type: Secondary | ICD-10-CM | POA: Diagnosis not present

## 2021-06-02 DIAGNOSIS — F909 Attention-deficit hyperactivity disorder, unspecified type: Secondary | ICD-10-CM | POA: Diagnosis not present

## 2021-06-09 ENCOUNTER — Other Ambulatory Visit: Payer: Self-pay

## 2021-06-09 ENCOUNTER — Ambulatory Visit (INDEPENDENT_AMBULATORY_CARE_PROVIDER_SITE_OTHER): Payer: BC Managed Care – PPO | Admitting: Physician Assistant

## 2021-06-09 ENCOUNTER — Encounter (INDEPENDENT_AMBULATORY_CARE_PROVIDER_SITE_OTHER): Payer: Self-pay | Admitting: Physician Assistant

## 2021-06-09 VITALS — BP 110/70 | HR 79 | Temp 98.0°F | Ht 65.0 in | Wt 192.0 lb

## 2021-06-09 DIAGNOSIS — Z6834 Body mass index (BMI) 34.0-34.9, adult: Secondary | ICD-10-CM

## 2021-06-09 DIAGNOSIS — E669 Obesity, unspecified: Secondary | ICD-10-CM | POA: Diagnosis not present

## 2021-06-09 DIAGNOSIS — E1169 Type 2 diabetes mellitus with other specified complication: Secondary | ICD-10-CM | POA: Diagnosis not present

## 2021-06-09 NOTE — Progress Notes (Signed)
Chief Complaint:   OBESITY Gracie is here to discuss her progress with her obesity treatment plan along with follow-up of her obesity related diagnoses. Phillis is on practicing portion control and making smarter food choices, such as increasing vegetables and decreasing simple carbohydrates - 80 grams of protein and states she is following her eating plan approximately 90% of the time. Camron states she is walking for 30 minutes 2-3 times per week.  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  She states that she is practicing "intuitive eating".  She wants to work on meal planning.  Subjective:   1. Type 2 diabetes mellitus with other specified complication, without long-term current use of insulin (HCC) Last A1c 6.8.  On metformin, taking 500 mg 1/2 tablet daily.  Has been more compliant with her dose over the last few weeks.  Assessment/Plan:   1. Type 2 diabetes mellitus with other specified complication, without long-term current use of insulin (HCC) Continue with metformin and weight loss.  Good blood sugar control is important to decrease the likelihood of diabetic complications such as nephropathy, neuropathy, limb loss, blindness, coronary artery disease, and death. Intensive lifestyle modification including diet, exercise and weight loss are the first line of treatment for diabetes.   2. Obesity with current BMI 31.95  Aryonna is currently in the action stage of change. As such, her goal is to continue with weight loss efforts. She has agreed to practicing portion control and making smarter food choices, such as increasing vegetables and decreasing simple carbohydrates.   Exercise goals: All adults should avoid inactivity. Some  physical activity is better than none, and adults who participate in any amount of physical activity gain some health benefits.  Behavioral modification strategies: meal planning and cooking strategies and keeping healthy foods in the home.  Shailynn has agreed to follow-up with our clinic in 8 weeks. She was informed of the importance of frequent follow-up visits to maximize her success with intensive lifestyle modifications for her multiple health conditions.   Objective:   Blood pressure 110/70, pulse 79, temperature 98 F (36.7 C), height 5\' 5"  (1.651 m), weight 192 lb (87.1 kg), SpO2 98 %. Body mass index is 31.95 kg/m.  General: Cooperative, alert, well developed, in no acute distress. HEENT: Conjunctivae and lids unremarkable. Cardiovascular: Regular rhythm.  Lungs: Normal work of breathing. Neurologic: No focal deficits.   Lab Results  Component Value Date   CREATININE 0.59 12/09/2020   BUN 8 12/09/2020   NA 140 12/09/2020   K 4.4 12/09/2020   CL 102 12/09/2020   CO2 22 12/09/2020   Lab Results  Component Value Date   ALT 10 12/09/2020   AST 13 12/09/2020   ALKPHOS 78 12/09/2020   BILITOT 0.5 12/09/2020   Lab Results  Component Value Date   HGBA1C 6.8 (H) 03/17/2021   HGBA1C 6.5 (H) 12/09/2020   HGBA1C 6.4 (H) 07/28/2020   HGBA1C 6.1 (H) 04/16/2020   HGBA1C 5.8 (H) 12/27/2019   Lab Results  Component Value Date   INSULIN 15.4 12/09/2020   INSULIN 13.8 07/28/2020   INSULIN 18.4 04/16/2020   INSULIN 14.7 12/27/2019   INSULIN 14.8 09/26/2019   Lab Results  Component Value Date  TSH 1.400 07/28/2020   Lab Results  Component Value Date   CHOL 173 03/17/2021   HDL 39 (L) 03/17/2021   LDLCALC 105 (H) 03/17/2021   TRIG 165 (H) 03/17/2021   CHOLHDL 3.8 08/25/2018   Lab Results  Component Value Date   VD25OH 44.8 03/17/2021   VD25OH 38.1 12/09/2020   VD25OH 75.9 07/28/2020   Lab Results  Component Value Date   WBC 6.5 05/04/2019   HGB 13.0  05/04/2019   HCT 38.7 05/04/2019   MCV 91.5 05/04/2019   PLT 289 05/04/2019   Attestation Statements:   Reviewed by clinician on day of visit: allergies, medications, problem list, medical history, surgical history, family history, social history, and previous encounter notes.  Time spent on visit including pre-visit chart review and post-visit care and charting was 30 minutes.   I, Insurance claims handler, CMA, am acting as transcriptionist for Alois Cliche, PA-C  I have reviewed the above documentation for accuracy and completeness, and I agree with the above. Alois Cliche, PA-C

## 2021-06-10 DIAGNOSIS — F909 Attention-deficit hyperactivity disorder, unspecified type: Secondary | ICD-10-CM | POA: Diagnosis not present

## 2021-06-17 DIAGNOSIS — F909 Attention-deficit hyperactivity disorder, unspecified type: Secondary | ICD-10-CM | POA: Diagnosis not present

## 2021-06-30 DIAGNOSIS — F909 Attention-deficit hyperactivity disorder, unspecified type: Secondary | ICD-10-CM | POA: Diagnosis not present

## 2021-07-01 DIAGNOSIS — F331 Major depressive disorder, recurrent, moderate: Secondary | ICD-10-CM | POA: Diagnosis not present

## 2021-07-01 DIAGNOSIS — F902 Attention-deficit hyperactivity disorder, combined type: Secondary | ICD-10-CM | POA: Diagnosis not present

## 2021-07-01 DIAGNOSIS — F422 Mixed obsessional thoughts and acts: Secondary | ICD-10-CM | POA: Diagnosis not present

## 2021-07-08 DIAGNOSIS — F909 Attention-deficit hyperactivity disorder, unspecified type: Secondary | ICD-10-CM | POA: Diagnosis not present

## 2021-07-15 DIAGNOSIS — F909 Attention-deficit hyperactivity disorder, unspecified type: Secondary | ICD-10-CM | POA: Diagnosis not present

## 2021-07-16 DIAGNOSIS — F902 Attention-deficit hyperactivity disorder, combined type: Secondary | ICD-10-CM | POA: Diagnosis not present

## 2021-07-16 DIAGNOSIS — F422 Mixed obsessional thoughts and acts: Secondary | ICD-10-CM | POA: Diagnosis not present

## 2021-07-16 DIAGNOSIS — F331 Major depressive disorder, recurrent, moderate: Secondary | ICD-10-CM | POA: Diagnosis not present

## 2021-07-17 ENCOUNTER — Ambulatory Visit
Admission: EM | Admit: 2021-07-17 | Discharge: 2021-07-17 | Disposition: A | Discharge status: Home / Self Care | Payer: BC Managed Care – PPO

## 2021-07-17 ENCOUNTER — Ambulatory Visit: Payer: BC Managed Care – PPO

## 2021-07-17 ENCOUNTER — Other Ambulatory Visit: Payer: Self-pay

## 2021-07-17 ENCOUNTER — Encounter: Payer: Self-pay | Admitting: Emergency Medicine

## 2021-07-17 DIAGNOSIS — Z1152 Encounter for screening for COVID-19: Secondary | ICD-10-CM

## 2021-07-17 DIAGNOSIS — J069 Acute upper respiratory infection, unspecified: Secondary | ICD-10-CM

## 2021-07-17 LAB — POCT INFLUENZA A/B
Influenza A, POC: NEGATIVE
Influenza B, POC: NEGATIVE

## 2021-07-17 NOTE — ED Provider Notes (Signed)
[facility]    CSN: [id number] Arrival date & time: 07/17/21  1203      History   Chief Complaint Chief Complaint  Patient presents with   Nasal Congestion   Cough   APPT 1200    HPI Nicole [name] is a 40 y.o. female.   Patient presenting today with 1 day history of nasal congestion, dry cough, low-grade fever, body aches, fatigue.  States she is feeling much better overall today but wanted to get testing for flu and COVID as she has plans over the weekend.  She denies chest pain, shortness of breath, abdominal pain, nausea vomiting or diarrhea.  Taking ibuprofen and Tylenol with mild relief of symptoms.  No known pertinent chronic medical problems.  No known sick contacts recently.   Past Medical History:  Diagnosis Date   Anxiety    Back pain    Depression    GERD (gastroesophageal reflux disease)    Hypothyroidism    Palpitations    Pre-diabetes    Vitamin D deficiency     Patient Active Problem List   Diagnosis Date Noted   Diabetes mellitus (HCC) 03/23/2021   Statin declined 03/17/2021   Vitamin D deficiency 11/08/2019   Class 1 obesity with serious comorbidity and body mass index (BMI) of 34.0 to 34.9 in adult 09/29/2019   Other hyperlipidemia 09/27/2019   Prediabetes 09/27/2019   Rectocele 03/22/2019   Moody 08/21/2018   Fatigue 08/21/2018   Depression 08/21/2018   Screening cholesterol level 08/21/2018   Fetal macrosomia in pregnancy in third trimester 06/05/2013   Supervision of other normal pregnancy 12/05/2012   Hypothyroidism 11/15/2012    Past Surgical History:  Procedure Laterality Date   MOUTH SURGERY      OB History     Gravida  3   Para  3   Term  3   Preterm      AB      Living  3      SAB      IAB      Ectopic      Multiple      Live Births  3            Home Medications    Prior to Admission medications   Medication Sig Start Date End Date Taking? Authorizing Provider   amphetamine-dextroamphetamine (ADDERALL XR) 15 MG 24 hr capsule Take 15 mg by mouth every morning.   Yes [provider]  Cholecalciferol (VITAMIN D) 125 MCG (5000 UT) CAPS Take 1 capsule by mouth daily. 08/11/20  Yes Whitmire, Thermon Leyland, FNP  famotidine (PEPCID) 20 MG tablet Take 20 mg by mouth 2 (two) times daily.   Yes [provider]  fexofenadine (ALLEGRA) 180 MG tablet Take 180 mg by mouth daily.   Yes [provider]  guanFACINE (INTUNIV) 1 MG TB24 ER tablet Take 1 mg by mouth at bedtime. 07/11/21  Yes [provider]  ibuprofen (ADVIL) 200 MG tablet Take 200 mg by mouth every 6 (six) hours as needed.   Yes [provider]  levothyroxine (SYNTHROID) 50 MCG tablet Take 50 mcg by mouth daily before breakfast.   Yes [provider]  magnesium 30 MG tablet Take 30 mg by mouth 2 (two) times daily.   Yes [provider]  metFORMIN (GLUCOPHAGE XR) 500 MG 24 hr tablet Take 1 tablet (500 mg total) by mouth daily with breakfast. 07/29/20  Yes Whitmire, Thermon Leyland, FNP  Multiple Vitamin (MULTIVITAMIN)  tablet Take 1 tablet by mouth daily.   Yes [provider]  methylphenidate 27 MG PO CR tablet Take 27 mg by mouth daily as needed. 02/18/21   [provider]  methylphenidate 36 MG PO CR tablet Take 36 mg by mouth daily.    [provider]  mometasone (NASONEX) 50 MCG/ACT nasal spray Place 2 sprays into the nose daily.    [provider]    Family History Family History  Problem Relation Age of Onset   Thyroid disease Mother    Depression Mother    Hypertension Father    Depression Father    GER disease Sister    Breast cancer Maternal Aunt    Hypertension Maternal Grandmother    Depression Maternal Grandmother    Depression Maternal Grandfather    Hypertension Paternal Grandmother    Anxiety disorder Paternal Grandmother    Macular degeneration Paternal Grandmother    Diabetes Paternal Grandfather      Social History Social History   Tobacco Use   Smoking status: Never   Smokeless tobacco: Never  Vaping Use   Vaping Use: Never used  Substance Use Topics   Alcohol use: No   Drug use: No     Allergies   Bupropion   Review of Systems Review of Systems Per HPI  Physical Exam Triage Vital Signs ED Triage Vitals  Enc Vitals Group     BP 07/17/21 1246 126/82     Pulse Rate 07/17/21 1246 93     Resp 07/17/21 1246 16     Temp 07/17/21 1246 98.5 F (36.9 C)     Temp Source 07/17/21 1246 Oral     SpO2 07/17/21 1246 97 %     Weight --      Height --      Head Circumference --      Peak Flow --      Pain Score 07/17/21 1243 2     Pain Loc --      Pain Edu? --      Excl. in GC? --    No data found.  Updated Vital Signs BP 126/82 (BP Location: Right Arm)   Pulse 93   Temp 98.5 F (36.9 C) (Oral)   Resp 16   LMP 07/03/2021 (Approximate)   SpO2 97%   Visual Acuity Right Eye Distance:   Left Eye Distance:   Bilateral Distance:    Right Eye Near:   Left Eye Near:    Bilateral Near:     Physical Exam Vitals and nursing note reviewed.  Constitutional:      Appearance: Normal appearance. She is not ill-appearing.  HENT:     Head: Atraumatic.     Right Ear: Tympanic membrane normal.     Left Ear: Tympanic membrane normal.     Nose: Rhinorrhea present.     Mouth/Throat:     Mouth: Mucous membranes are moist.     Pharynx: Posterior oropharyngeal erythema present. No oropharyngeal exudate.  Eyes:     Extraocular Movements: Extraocular movements intact.     Conjunctiva/sclera: Conjunctivae normal.  Cardiovascular:     Rate and Rhythm: Normal rate and regular rhythm.     Heart sounds: Normal heart sounds.  Pulmonary:     Effort: Pulmonary effort is normal. No respiratory distress.     Breath sounds: Normal breath sounds. No wheezing or rales.  Abdominal:     General: Bowel sounds are normal. There is no distension.  Palpations: Abdomen is soft.      Tenderness: There is no abdominal tenderness. There is no guarding.  Musculoskeletal:        General: Normal range of motion.     Cervical back: Normal range of motion and neck supple.  Skin:    General: Skin is warm and dry.  Neurological:     Mental Status: She is alert and oriented to person, place, and time.  Psychiatric:        Mood and Affect: Mood normal.        Thought Content: Thought content normal.        Judgment: Judgment normal.     UC Treatments / Results  Labs (all labs ordered are listed, but only abnormal results are displayed) Labs Reviewed  NOVEL CORONAVIRUS, NAA  POCT INFLUENZA A/B    EKG   Radiology No results found.  Procedures Procedures (including critical care time)  Medications Ordered in UC Medications - No data to display  Initial Impression / Assessment and Plan / UC Course  I have reviewed the triage vital signs and the nursing notes.  Pertinent labs & imaging results that were available during my care of the patient were reviewed by me and considered in my medical decision making (see chart for details).     Vitals and exam very reassuring today, suspect viral illness.  Rapid flu testing negative, COVID PCR pending.  She declines prescription medications today and will continue over-the-counter supportive medications and home care.  Discussed quarantine protocol, return for acutely worsening symptoms.  Final Clinical Impressions(s) / UC Diagnoses   Final diagnoses:  Encounter for screening for COVID-19  Viral URI   Discharge Instructions   None    ED Prescriptions   None    PDMP not reviewed this encounter.   Particia Nearing, [place] 07/17/21 1308

## 2021-07-17 NOTE — ED Triage Notes (Signed)
Patient c/o nasal congestion and nonproductive cough x 1 day.   Patient endorses temperature of 99.2 F at it's highest at home.   Patient is requesting a rapid test for flu.   Patient endorses generalized body aches. Patient endorses swollen tonsils upon waking yesterday.   Patient has taken ibuprofen and tylenol yesterday with some relief of symptoms.

## 2021-07-19 LAB — NOVEL CORONAVIRUS, NAA: SARS-CoV-2, NAA: NOT DETECTED

## 2021-07-19 LAB — SARS-COV-2, NAA 2 DAY TAT

## 2021-07-22 DIAGNOSIS — F909 Attention-deficit hyperactivity disorder, unspecified type: Secondary | ICD-10-CM | POA: Diagnosis not present

## 2021-07-27 ENCOUNTER — Ambulatory Visit (INDEPENDENT_AMBULATORY_CARE_PROVIDER_SITE_OTHER): Payer: BC Managed Care – PPO | Admitting: Bariatrics

## 2021-07-27 ENCOUNTER — Encounter (INDEPENDENT_AMBULATORY_CARE_PROVIDER_SITE_OTHER): Payer: Self-pay | Admitting: Bariatrics

## 2021-07-27 ENCOUNTER — Other Ambulatory Visit: Payer: Self-pay

## 2021-07-27 VITALS — BP 109/72 | HR 77 | Temp 97.8°F | Ht 65.0 in | Wt 185.0 lb

## 2021-07-27 DIAGNOSIS — Z6834 Body mass index (BMI) 34.0-34.9, adult: Secondary | ICD-10-CM

## 2021-07-27 DIAGNOSIS — E1169 Type 2 diabetes mellitus with other specified complication: Secondary | ICD-10-CM | POA: Diagnosis not present

## 2021-07-27 DIAGNOSIS — E7849 Other hyperlipidemia: Secondary | ICD-10-CM

## 2021-07-27 DIAGNOSIS — E559 Vitamin D deficiency, unspecified: Secondary | ICD-10-CM | POA: Diagnosis not present

## 2021-07-27 DIAGNOSIS — E669 Obesity, unspecified: Secondary | ICD-10-CM

## 2021-07-27 NOTE — Progress Notes (Signed)
Chief Complaint:   OBESITY Nicole [name] is here to discuss her progress with her obesity treatment plan along with follow-up of her obesity related diagnoses. Nicole [name] is on practicing portion control and making smarter food choices, such as increasing vegetables and decreasing simple carbohydrates and states she is following her eating plan approximately 95% of the time. Nicole [name] states she is doing 0 minutes 0 times per week.  Today's visit was #: 18 Starting weight: 210 lbs Starting date: 09/26/2019 Today's weight: 185 lbs Today's date: 07/27/2021 Total lbs lost to date: 25 lbs Total lbs lost since last in-office visit: 7 lbs  Interim History: Nicole [name] is down an additional 7 lbs since her last visit. She is more aware of when she is full.  Subjective:   1. Type 2 diabetes mellitus with other specified complication, without long-term current use of insulin (HCC) Nicole [name] is taking Metformin currently.  2. Other hyperlipidemia Nicole [name] is currently not on medications.   3. Vitamin D deficiency Nicole [name] is taking OTC Vitamin D currently.  Assessment/Plan:   1. Type 2 diabetes mellitus with other specified complication, without long-term current use of insulin (HCC) We will check Nicole [name]'s A1C, Insulin and CMP today. Good blood sugar control is important to decrease the likelihood of diabetic complications such as nephropathy, neuropathy, limb loss, blindness, coronary artery disease, and death. Intensive lifestyle modification including diet, exercise and weight loss are the first line of treatment for diabetes.   - Comprehensive metabolic panel - Insulin, random - Hemoglobin A1c  2. Other hyperlipidemia Cardiovascular risk and specific lipid/LDL goals reviewed.  We discussed several lifestyle modifications today and Shannah will continue to work on diet, exercise and weight loss efforts. We will check Lipid panel today. Stellarose will have zero trans fats. Orders and follow up as documented in patient  record.   Counseling Intensive lifestyle modifications are the first line treatment for this issue. Dietary changes: Increase soluble fiber. Decrease simple carbohydrates. Exercise changes: Moderate to vigorous-intensity aerobic activity 150 minutes per week if tolerated. Lipid-lowering medications: see documented in medical record.  - Lipid Panel With LDL/HDL Ratio  3. Vitamin D deficiency Low Vitamin D level contributes to fatigue and are associated with obesity, breast, and colon cancer. We will check Vitamin D today. Nicole [name] will continue OTC Vitamin D and she will follow-up for routine testing of Vitamin D, at least 2-3 times per year to avoid over-replacement.  - VITAMIN D 25 Hydroxy (Vit-D Deficiency, Fractures)  4. Obesity BMI today is 31 Nicole [name] is currently in the action stage of change. As such, her goal is to continue with weight loss efforts. She has agreed to practicing portion control and making smarter food choices, such as increasing vegetables and decreasing simple carbohydrates.   Nicole [name] will be mindful eating. Strategies for the holidays were provided.  Exercise goals: No exercise has been prescribed at this time.  Behavioral modification strategies: increasing lean protein intake, decreasing simple carbohydrates, increasing vegetables, increasing water intake, decreasing eating out, no skipping meals, meal planning and cooking strategies, keeping healthy foods in the home, and planning for success.  Nicole [name] has agreed to follow-up with our clinic in 3 weeks with Nicole Salvage, FNP or Nicole Redden, PA-C.Marland Kitchen She was informed of the importance of frequent follow-up visits to maximize her success with intensive lifestyle modifications for her multiple health conditions.   Nicole [name] was informed we would discuss her lab results at her next visit unless there is a critical issue that needs to be  addressed sooner. Nicole [name] agreed to keep her next visit at the agreed upon time to discuss  these results.  Objective:   Pulse 77, temperature 97.8 F (36.6 C), height 5\' 5"  (1.651 m), weight 185 lb (83.9 kg), last menstrual period 07/03/2021, SpO2 97 %. Body mass index is 30.79 kg/m.  General: Cooperative, alert, well developed, in no acute distress. HEENT: Conjunctivae and lids unremarkable. Cardiovascular: Regular rhythm.  Lungs: Normal work of breathing. Neurologic: No focal deficits.   Lab Results  Component Value Date   CREATININE 0.59 12/09/2020   BUN 8 12/09/2020   NA 140 12/09/2020   K 4.4 12/09/2020   CL 102 12/09/2020   CO2 22 12/09/2020   Lab Results  Component Value Date   ALT 10 12/09/2020   AST 13 12/09/2020   ALKPHOS 78 12/09/2020   BILITOT 0.5 12/09/2020   Lab Results  Component Value Date   HGBA1C 6.8 (H) 03/17/2021   HGBA1C 6.5 (H) 12/09/2020   HGBA1C 6.4 (H) 07/28/2020   HGBA1C 6.1 (H) 04/16/2020   HGBA1C 5.8 (H) 12/27/2019   Lab Results  Component Value Date   INSULIN 15.4 12/09/2020   INSULIN 13.8 07/28/2020   INSULIN 18.4 04/16/2020   INSULIN 14.7 12/27/2019   INSULIN 14.8 09/26/2019   Lab Results  Component Value Date   TSH 1.400 07/28/2020   Lab Results  Component Value Date   CHOL 173 03/17/2021   HDL 39 (L) 03/17/2021   LDLCALC 105 (H) 03/17/2021   TRIG 165 (H) 03/17/2021   CHOLHDL 3.8 08/25/2018   Lab Results  Component Value Date   VD25OH 44.8 03/17/2021   VD25OH 38.1 12/09/2020   VD25OH 75.9 07/28/2020   Lab Results  Component Value Date   WBC 6.5 05/04/2019   HGB 13.0 05/04/2019   HCT 38.7 05/04/2019   MCV 91.5 05/04/2019   PLT 289 05/04/2019   No results found for: IRON, TIBC, FERRITIN  Attestation Statements:   Reviewed by clinician on day of visit: allergies, medications, problem list, medical history, surgical history, family history, social history, and previous encounter notes.  I, 05/06/2019, RMA, am acting as Jackson Latino for Energy manager, DO.   I have reviewed the above  documentation for accuracy and completeness, and I agree with the above. [employer], DO

## 2021-07-28 DIAGNOSIS — F909 Attention-deficit hyperactivity disorder, unspecified type: Secondary | ICD-10-CM | POA: Diagnosis not present

## 2021-07-28 LAB — COMPREHENSIVE METABOLIC PANEL
ALT: 12 IU/L (ref 0–32)
AST: 11 IU/L (ref 0–40)
Albumin/Globulin Ratio: 1.9 (ref 1.2–2.2)
Albumin: 4.7 g/dL (ref 3.8–4.8)
Alkaline Phosphatase: 70 IU/L (ref 44–121)
BUN/Creatinine Ratio: 12 (ref 9–23)
BUN: 8 mg/dL (ref 6–24)
Bilirubin Total: 0.3 mg/dL (ref 0.0–1.2)
CO2: 22 mmol/L (ref 20–29)
Calcium: 9.5 mg/dL (ref 8.7–10.2)
Chloride: 101 mmol/L (ref 96–106)
Creatinine, Ser: 0.69 mg/dL (ref 0.57–1.00)
Globulin, Total: 2.5 g/dL (ref 1.5–4.5)
Glucose: 123 mg/dL — ABNORMAL HIGH (ref 70–99)
Potassium: 4.4 mmol/L (ref 3.5–5.2)
Sodium: 138 mmol/L (ref 134–144)
Total Protein: 7.2 g/dL (ref 6.0–8.5)
eGFR: 112 mL/min/{1.73_m2} (ref >59)

## 2021-07-28 LAB — HEMOGLOBIN A1C
Est. average glucose Bld gHb Est-mCnc: 120 mg/dL
Hgb A1c MFr Bld: 5.8 % — ABNORMAL HIGH (ref 4.8–5.6)

## 2021-07-28 LAB — LIPID PANEL WITH LDL/HDL RATIO
Cholesterol, Total: 180 mg/dL (ref 100–199)
HDL: 45 mg/dL (ref >39)
LDL Chol Calc (NIH): 115 mg/dL — ABNORMAL HIGH (ref 0–99)
LDL/HDL Ratio: 2.6 ratio (ref 0.0–3.2)
Triglycerides: 112 mg/dL (ref 0–149)
VLDL Cholesterol Cal: 20 mg/dL (ref 5–40)

## 2021-07-28 LAB — INSULIN, RANDOM: INSULIN: 13.4 u[IU]/mL (ref 2.6–24.9)

## 2021-07-28 LAB — VITAMIN D 25 HYDROXY (VIT D DEFICIENCY, FRACTURES): Vit D, 25-Hydroxy: 51.4 ng/mL (ref 30.0–100.0)

## 2021-08-04 DIAGNOSIS — F909 Attention-deficit hyperactivity disorder, unspecified type: Secondary | ICD-10-CM | POA: Diagnosis not present

## 2021-08-11 DIAGNOSIS — F909 Attention-deficit hyperactivity disorder, unspecified type: Secondary | ICD-10-CM | POA: Diagnosis not present

## 2021-08-13 DIAGNOSIS — L821 Other seborrheic keratosis: Secondary | ICD-10-CM | POA: Diagnosis not present

## 2021-08-13 DIAGNOSIS — L814 Other melanin hyperpigmentation: Secondary | ICD-10-CM | POA: Diagnosis not present

## 2021-08-13 DIAGNOSIS — L578 Other skin changes due to chronic exposure to nonionizing radiation: Secondary | ICD-10-CM | POA: Diagnosis not present

## 2021-08-13 DIAGNOSIS — D225 Melanocytic nevi of trunk: Secondary | ICD-10-CM | POA: Diagnosis not present

## 2021-08-18 DIAGNOSIS — F909 Attention-deficit hyperactivity disorder, unspecified type: Secondary | ICD-10-CM | POA: Diagnosis not present

## 2021-08-26 DIAGNOSIS — F909 Attention-deficit hyperactivity disorder, unspecified type: Secondary | ICD-10-CM | POA: Diagnosis not present

## 2021-09-15 ENCOUNTER — Encounter (INDEPENDENT_AMBULATORY_CARE_PROVIDER_SITE_OTHER): Payer: Self-pay | Admitting: Bariatrics

## 2021-09-15 ENCOUNTER — Ambulatory Visit (INDEPENDENT_AMBULATORY_CARE_PROVIDER_SITE_OTHER): Payer: 59 | Admitting: Bariatrics

## 2021-09-15 ENCOUNTER — Other Ambulatory Visit: Payer: Self-pay

## 2021-09-15 VITALS — BP 117/78 | HR 81 | Temp 97.5°F | Ht 65.0 in | Wt 187.0 lb

## 2021-09-15 DIAGNOSIS — E1169 Type 2 diabetes mellitus with other specified complication: Secondary | ICD-10-CM

## 2021-09-15 DIAGNOSIS — E669 Obesity, unspecified: Secondary | ICD-10-CM | POA: Diagnosis not present

## 2021-09-15 DIAGNOSIS — E7849 Other hyperlipidemia: Secondary | ICD-10-CM | POA: Diagnosis not present

## 2021-09-15 DIAGNOSIS — Z6831 Body mass index (BMI) 31.0-31.9, adult: Secondary | ICD-10-CM

## 2021-09-15 MED ORDER — METFORMIN HCL ER 500 MG PO TB24: 500.0000 mg | ORAL_TABLET | Freq: Every day | ORAL | 0 refills | Status: DC

## 2021-09-15 NOTE — Progress Notes (Signed)
Chief Complaint:   OBESITY Nicole [name] is here to discuss her progress with her obesity treatment plan along with follow-up of her obesity related diagnoses. Nicole [name] is on practicing portion control and making smarter food choices, such as increasing vegetables and decreasing simple carbohydrates and states she is following her eating plan approximately 85% of the time. Nicole [name] states she is doing 0 minutes 0 times per week.  Today's visit was #: 19 Starting weight: 210 lbs Starting date: 09/26/2019 Today's weight: 187 lbs Today's date: 09/15/2021 Total lbs lost to date: 23 lbs Total lbs lost since last in-office visit: 0  Interim History: Nicole [name] is up 2 lbs over the holidays. Her body weight (increased body water) per the bioimependance.   Subjective:   1. Type 2 diabetes mellitus with other specified complication, without long-term current use of insulin (HCC) Nicole [name] states she has a normal appetite. She is now in the pre-diabetes range. Her A1C was 5.8.  2. Other hyperlipidemia Nicole [name] is currently not on medications. She declined statis.   Assessment/Plan:   1. Type 2 diabetes mellitus with other specified complication, without long-term current use of insulin (HCC) We will refill Metformin 500 mg for 3 months with no refills. Good blood sugar control is important to decrease the likelihood of diabetic complications such as nephropathy, neuropathy, limb loss, blindness, coronary artery disease, and death. Intensive lifestyle modification including diet, exercise and weight loss are the first line of treatment for diabetes.   - metFORMIN (GLUCOPHAGE XR) 500 MG 24 hr tablet; Take 1 tablet (500 mg total) by mouth daily with breakfast.  Dispense: 90 tablet; Refill: 0  2. Other hyperlipidemia Cardiovascular risk and specific lipid/LDL goals reviewed.  We discussed several lifestyle modifications today and Nicole [name] will continue to work on diet, exercise and weight loss efforts. Orders and follow up  as documented in patient record.   Counseling Intensive lifestyle modifications are the first line treatment for this issue. Dietary changes: Increase soluble fiber. Decrease simple carbohydrates. Exercise changes: Moderate to vigorous-intensity aerobic activity 150 minutes per week if tolerated. Lipid-lowering medications: see documented in medical record.  3. Class 1 obesity with serious comorbidity and body mass index (BMI) of 31.0 to 31.9 in adult, unspecified obesity type Nicole [name] is currently in the action stage of change. As such, her goal is to continue with weight loss efforts. She has agreed to practicing portion control and making smarter food choices, such as increasing vegetables and decreasing simple carbohydrates.   Nicole [name] will continue meal planning. She will adhere closely to the plan 90% or more and she will cut out extra foods.  She will decrease carbohydrates and she will increase water, protein and exercise.   Exercise goals:  [name]  Behavioral modification strategies: increasing lean protein intake, decreasing simple carbohydrates, increasing vegetables, increasing water intake, decreasing eating out, no skipping meals, meal planning and cooking strategies, keeping healthy foods in the home, and planning for success.  Nicole [name] has agreed to follow-up with our clinic in 3 weeks. She was informed of the importance of frequent follow-up visits to maximize her success with intensive lifestyle modifications for her multiple health conditions.   Objective:   Blood pressure 117/78, pulse 81, temperature (!) 97.5 F (36.4 C), height 5\' 5"  (1.651 m), weight 187 lb (84.8 kg), SpO2 99 %. Body mass index is 31.12 kg/m.  General: Cooperative, alert, well developed, in no acute distress. HEENT: Conjunctivae and lids unremarkable. Cardiovascular: Regular rhythm.  Lungs: Normal work of breathing. Neurologic: No focal deficits.   Lab Results  Component Value  Date   CREATININE 0.69 07/27/2021   BUN 8 07/27/2021   NA 138 07/27/2021   K 4.4 07/27/2021   CL 101 07/27/2021   CO2 22 07/27/2021   Lab Results  Component Value Date   ALT 12 07/27/2021   AST 11 07/27/2021   ALKPHOS 70 07/27/2021   BILITOT 0.3 07/27/2021   Lab Results  Component Value Date   HGBA1C 5.8 (H) 07/27/2021   HGBA1C 6.8 (H) 03/17/2021   HGBA1C 6.5 (H) 12/09/2020   HGBA1C 6.4 (H) 07/28/2020   HGBA1C 6.1 (H) 04/16/2020   Lab Results  Component Value Date   INSULIN 13.4 07/27/2021   INSULIN 15.4 12/09/2020   INSULIN 13.8 07/28/2020   INSULIN 18.4 04/16/2020   INSULIN 14.7 12/27/2019   Lab Results  Component Value Date   TSH 1.400 07/28/2020   Lab Results  Component Value Date   CHOL 180 07/27/2021   HDL 45 07/27/2021   LDLCALC 115 (H) 07/27/2021   TRIG 112 07/27/2021   CHOLHDL 3.8 08/25/2018   Lab Results  Component Value Date   VD25OH 51.4 07/27/2021   VD25OH 44.8 03/17/2021   VD25OH 38.1 12/09/2020   Lab Results  Component Value Date   WBC 6.5 05/04/2019   HGB 13.0 05/04/2019   HCT 38.7 05/04/2019   MCV 91.5 05/04/2019   PLT 289 05/04/2019   No results found for: IRON, TIBC, FERRITIN  Attestation Statements:   Reviewed by clinician on day of visit: allergies, medications, problem list, medical history, surgical history, family history, social history, and previous encounter notes.  I, Jackson Latino, RMA, am acting as Energy manager for [employer], DO.  I have reviewed the above documentation for accuracy and completeness, and I agree with the above. Corinna Capra, DO

## 2021-10-06 ENCOUNTER — Ambulatory Visit (INDEPENDENT_AMBULATORY_CARE_PROVIDER_SITE_OTHER): Payer: 59 | Admitting: Bariatrics

## 2021-11-12 ENCOUNTER — Ambulatory Visit (INDEPENDENT_AMBULATORY_CARE_PROVIDER_SITE_OTHER): Payer: 59

## 2021-11-12 ENCOUNTER — Ambulatory Visit: Payer: 59 | Admitting: Sports Medicine

## 2021-11-12 ENCOUNTER — Encounter: Payer: Self-pay | Admitting: Sports Medicine

## 2021-11-12 ENCOUNTER — Other Ambulatory Visit: Payer: Self-pay

## 2021-11-12 VITALS — BP 138/90 | HR 89

## 2021-11-12 DIAGNOSIS — M79672 Pain in left foot: Secondary | ICD-10-CM

## 2021-11-12 DIAGNOSIS — M792 Neuralgia and neuritis, unspecified: Secondary | ICD-10-CM | POA: Diagnosis not present

## 2021-11-12 DIAGNOSIS — M722 Plantar fascial fibromatosis: Secondary | ICD-10-CM

## 2021-11-12 MED ORDER — MELOXICAM 15 MG PO TABS: 15.0000 mg | ORAL_TABLET | Freq: Every day | ORAL | 0 refills | Status: DC

## 2021-11-12 MED ORDER — TRIAMCINOLONE ACETONIDE 10 MG/ML IJ SUSP
10.0000 mg | Freq: Once | INTRAMUSCULAR | Status: AC
  Administered 2021-11-12: 10 mg

## 2021-11-12 NOTE — Progress Notes (Signed)
Subjective: ?Nicole [name] is a 41 y.o. female patient presents to office with complaint of moderate heel pain on the left. Patient admits to post static dyskinesia for several months likely started back in the fall when she was trying to do walking for exercise started feeling a pulling sensation in the heel but now the pain is more of a burning sharp sensation states that resting icing helps  Denies any other pedal complaints.  ? ?Patient Active Problem List  ? Diagnosis Date Noted  ? Diabetes mellitus (HCC) 03/23/2021  ? Statin declined 03/17/2021  ? Vitamin D deficiency 11/08/2019  ? Class 1 obesity with serious comorbidity and body mass index (BMI) of 34.0 to 34.9 in adult 09/29/2019  ? Other hyperlipidemia 09/27/2019  ? Prediabetes 09/27/2019  ? Rectocele 03/22/2019  ? Moody 08/21/2018  ? Fatigue 08/21/2018  ? Depression 08/21/2018  ? Screening cholesterol level 08/21/2018  ? Fetal macrosomia in pregnancy in third trimester 06/05/2013  ? Supervision of other normal pregnancy 12/05/2012  ? Hypothyroidism 11/15/2012  ? ? ?Current Outpatient Medications on File Prior to Visit  ?Medication Sig Dispense Refill  ? amphetamine-dextroamphetamine (ADDERALL XR) 15 MG 24 hr capsule Take 15 mg by mouth every morning.    ? Cholecalciferol (VITAMIN D) 125 MCG (5000 UT) CAPS Take 1 capsule by mouth daily. 30 capsule 0  ? famotidine (PEPCID) 20 MG tablet Take 20 mg by mouth 2 (two) times daily.    ? fexofenadine (ALLEGRA) 180 MG tablet Take 180 mg by mouth daily.    ? guanFACINE (INTUNIV) 1 MG TB24 ER tablet Take 1 mg by mouth at bedtime.    ? guanFACINE (INTUNIV) 2 MG TB24 ER tablet SMARTSIG:1 Tablet(s) By Mouth Every Evening    ? ibuprofen (ADVIL) 200 MG tablet Take 200 mg by mouth every 6 (six) hours as needed.    ? levothyroxine (SYNTHROID) 50 MCG tablet Take 50 mcg by mouth daily before breakfast.    ? magnesium 30 MG tablet Take 30 mg by mouth 2 (two) times daily.    ? metFORMIN (GLUCOPHAGE XR) 500 MG 24 hr tablet Take 1  tablet (500 mg total) by mouth daily with breakfast. 90 tablet 0  ? Multiple Vitamin (MULTIVITAMIN) tablet Take 1 tablet by mouth daily.    ? ?No current facility-administered medications on file prior to visit.  ? ? ?Allergies  ?Allergen Reactions  ? Bupropion Tinitus  ? ? ?Objective: ?Physical Exam ?General: The patient is alert and oriented x3 in no acute distress. ? ?Dermatology: Skin is warm, dry and supple bilateral lower extremities. Nails 1-10 are normal. There is no erythema, edema, no eccymosis, no open lesions present. Integument is otherwise unremarkable. ? ?Vascular: Dorsalis Pedis pulse and Posterior Tibial pulse are 1/4 bilateral.  Varicosities bilateral.  Capillary fill time is immediate to all digits. ? ?Neurological: Grossly intact to light touch bilateral.  Subjective burning sensation likely along the calcaneal nerve of the left heel. ? ?Musculoskeletal: Tenderness to palpation at the medial calcaneal tubercale and through the insertion of the plantar fascia on the left foot. No pain with compression of calcaneus bilateral.  No pain with calf compression bilateral. There is decreased Ankle joint range of motion bilateral. All other joints range of motion within normal limits bilateral. Strength 5/5 in all groups bilateral.  ? ?Gait: Unassisted. ? ?Xray, Left foot ?Normal osseous mineralization. Joint spaces preserved. No fracture/dislocation/boney destruction. Calcaneal spur present with mild thickening of plantar fascia. No other soft tissue abnormalities or radiopaque  foreign bodies.  ? ?Assessment and Plan: ?Problem List Items Addressed This Visit   ?None ?Visit Diagnoses   ? ? Plantar fasciitis of left foot    -  Primary  ? Relevant Medications  ? triamcinolone acetonide (KENALOG) 10 MG/ML injection 10 mg (Completed) (Start on 11/12/2021 10:45 PM)  ? Other Relevant Orders  ? DG Foot Complete Left (Completed)  ? Neuritis      ? Pain of left heel      ? ?  ? ? ?-Complete examination performed.   ?-Xrays reviewed ?-Discussed with patient in detail the condition of plantar fasciitis, how this occurs and general treatment options. Explained both conservative and surgical treatments.  ?-After oral consent and aseptic prep, injected a mixture containing 1 ml of 2%  ?plain lidocaine, 1 ml 0.5% plain marcaine, 0.5 ml of kenalog 10 and 0.5 ml of dexamethasone phosphate into left heel. Post-injection care discussed with patient.  Patient did become very faint after injection patient was placed in Trendelenburg position vitals were recorded and patient was noted to be stable after the brief episode resolved. ?-Rx Meloxicam ?-Advised the use of good supportive shoes daily and refrain from walking barefooted ?-Explained and dispensed to patient daily stretching exercises. ?-Recommend patient to ice affected area 1-2x daily. ?-Patient to return to office in 4 weeks for follow up or sooner if problems or questions arise. ? ?Asencion Islam, DPM ? ?

## 2021-11-20 ENCOUNTER — Telehealth: Payer: Self-pay | Admitting: Adult Health

## 2021-11-20 NOTE — Telephone Encounter (Signed)
Returned pt's call, two identifiers used. Pt denied any severe heavy bleeding, had started NSAIDS for ortho issue. Pt was told to document and appt made for pap/physical since last one was 2018. Pt was offered to make a nurse visit for a self swab if she kept having bleeding issues or any possible infection symptoms between now and her appt. Pt confirmed understanding. ?

## 2021-11-20 NOTE — Telephone Encounter (Signed)
Patient is bleeding right now and it's in the middle of her cycle. Please advise.  ?

## 2021-12-21 ENCOUNTER — Ambulatory Visit (INDEPENDENT_AMBULATORY_CARE_PROVIDER_SITE_OTHER): Payer: 59 | Admitting: Adult Health

## 2021-12-21 ENCOUNTER — Encounter: Payer: Self-pay | Admitting: Adult Health

## 2021-12-21 ENCOUNTER — Other Ambulatory Visit (HOSPITAL_COMMUNITY)
Admission: RE | Admit: 2021-12-21 | Discharge: 2021-12-21 | Disposition: A | Discharge status: Home / Self Care | Payer: 59 | Source: Ambulatory Visit | Attending: Adult Health | Admitting: Adult Health

## 2021-12-21 VITALS — BP 109/70 | HR 87 | Ht 65.0 in | Wt 174.0 lb

## 2021-12-21 DIAGNOSIS — Z1211 Encounter for screening for malignant neoplasm of colon: Secondary | ICD-10-CM | POA: Insufficient documentation

## 2021-12-21 DIAGNOSIS — Z0000 Encounter for general adult medical examination without abnormal findings: Secondary | ICD-10-CM

## 2021-12-21 DIAGNOSIS — Z01419 Encounter for gynecological examination (general) (routine) without abnormal findings: Secondary | ICD-10-CM | POA: Diagnosis present

## 2021-12-21 LAB — HEMOCCULT GUIAC POC 1CARD (OFFICE): Fecal Occult Blood, POC: NEGATIVE

## 2021-12-21 NOTE — Progress Notes (Signed)
Patient ID: Nicole [name], female   DOB: 04-Dec-1980, 41 y.o.   MRN: [id number] ?History of Present Illness: ?Nicole [name] is a 41 year old white female,married, G3P3 in for a well woman gyn exam and pap.  ?PCP is Dr Tanya Nones. ? ? ?Current Medications, Allergies, Past Medical History, Past Surgical History, Family History and Social History were reviewed in [employer] record.   ? ? ?Review of Systems: ?Patient denies any headaches, hearing loss, fatigue, blurred vision, shortness of breath, chest pain, abdominal pain, problems with bowel movements, urination, or intercourse. No joint pain or mood swings.  ?Periods are different, cycle not as regular  ?She has lost weight ? ?Physical Exam:BP 109/70 (BP Location: Right Arm, Patient Position: Sitting, Cuff Size: Normal)   Pulse 87   Ht 5\' 5"  (1.651 m)   Wt 174 lb (78.9 kg)   LMP 12/09/2021 (Exact Date)   BMI 28.96 kg/m?   ?General:  Well developed, well nourished, no acute distress ?Skin:  Warm and dry ?Neck:  Midline trachea, normal thyroid, good ROM, no lymphadenopathy ?Lungs; Clear to auscultation bilaterally ?Breast:  No dominant palpable mass, retraction, or nipple discharge ?Cardiovascular: Regular rate and rhythm ?Abdomen:  Soft, non tender, no hepatosplenomegaly ?Pelvic:  External genitalia is normal in appearance, no lesions.  The vagina is normal in appearance. Urethra has no lesions or masses. The cervix is bulbous,everted at os, pap with HR HPV genotyping performed.  Uterus is felt to be normal size, shape, and contour.  No adnexal masses or tenderness noted.Bladder is non tender, no masses felt. ?Rectal: Good sphincter tone, no polyps, or hemorrhoids felt.  Hemoccult negative. ?Extremities/musculoskeletal:  No swelling or varicosities noted, no clubbing or cyanosis ?Psych:  No mood changes, alert and cooperative,seems happy ?AA is 0 ?Fall risk is low ? ?  12/21/2021  ?  2:45 PM 09/26/2019  ?  7:56 AM 09/21/2018  ?  2:52 PM  ?Depression screen  PHQ 2/9  ?Decreased Interest 0 2 0  ?Down, Depressed, Hopeless 1 1 0  ?PHQ - 2 Score 1 3 0  ?Altered sleeping 0 3   ?Tired, decreased energy 1 3   ?Change in appetite 0 3   ?Feeling bad or failure about yourself  0 1   ?Trouble concentrating 0 3   ?Moving slowly or fidgety/restless 0 0   ?Suicidal thoughts 0 0   ?PHQ-9 Score 2 16   ?Difficult doing work/chores  Somewhat difficult   ?  ? ?  12/21/2021  ?  2:45 PM  ?GAD 7 : Generalized Anxiety Score  ?Nervous, Anxious, on Edge 1  ?Control/stop worrying 1  ?Worry too much - different things 0  ?Trouble relaxing 0  ?Restless 0  ?Easily annoyed or irritable 1  ?Afraid - awful might happen 0  ?Total GAD 7 Score 3  ? ?  ? [place] - 12/21/21 1444   ? ?  ? Pregnancy Intention Screening  ? Does the patient want to become pregnant in the next year? No   ? Does the patient's partner want to become pregnant in the next year? No   ? Would the patient like to discuss contraceptive options today? No   ?  ? Contraception Wrap Up  ? Current Method Vasectomy   ? End Method Vasectomy   ? Contraception Counseling Provided No   ? ?  ?  ? ?  ? Examination chaperoned by 02/20/22 LPN ? ? ?Impression and Plan: ?1. Encounter for gynecological examination with  Papanicolaou smear of cervix ?Pap sent ?Pap in 3 years if normal ?Physical with PCP next year ?Labs with PCP ?Mammogram yearly ?Colonoscopy or cologuard at 44  ?Review handout on perimenopause  ? ?2. Encounter for screening fecal occult blood testing ? ? ? ? ?  ?  ?

## 2021-12-21 NOTE — Addendum Note (Signed)
Addended by: Annamarie Dawley on: 12/21/2021 03:58 PM ? ? Modules accepted: Orders ? ?

## 2021-12-23 LAB — CYTOLOGY - PAP
Chlamydia: NEGATIVE
Comment: NEGATIVE
Comment: NEGATIVE
Comment: NORMAL
Diagnosis: NEGATIVE
High risk HPV: NEGATIVE
Neisseria Gonorrhea: NEGATIVE

## 2021-12-30 ENCOUNTER — Ambulatory Visit (INDEPENDENT_AMBULATORY_CARE_PROVIDER_SITE_OTHER): Payer: 59 | Admitting: Medical

## 2021-12-30 ENCOUNTER — Other Ambulatory Visit (HOSPITAL_COMMUNITY)
Admission: RE | Admit: 2021-12-30 | Discharge: 2021-12-30 | Disposition: A | Discharge status: Home / Self Care | Payer: 59 | Source: Ambulatory Visit | Attending: Medical | Admitting: Medical

## 2021-12-30 ENCOUNTER — Encounter: Payer: Self-pay | Admitting: Medical

## 2021-12-30 VITALS — BP 112/73 | HR 87 | Ht 65.0 in | Wt 174.0 lb

## 2021-12-30 DIAGNOSIS — N898 Other specified noninflammatory disorders of vagina: Secondary | ICD-10-CM | POA: Insufficient documentation

## 2021-12-30 NOTE — Progress Notes (Signed)
? ?  History:  ?[name] is a 41 y.o. G3P3003 who presents to clinic today for possible yeast infection. The patient was here for annual exam with pap smear last week. Pap was normal and GC/CT was negative. She noted vaginal itching and slight yellowish discharge on Saturday. She denies bleeding today.  ? ?The following portions of the patient's history were reviewed and updated as appropriate: allergies, current medications, family history, past medical history, social history, past surgical history and problem list. ? ?Review of Systems:  ?Review of Systems  ?Constitutional:  Negative for fever and malaise/fatigue.  ?Gastrointestinal:  Negative for abdominal pain.  ?Genitourinary:  Negative for dysuria, frequency and urgency.  ?     + vaginal discharge  ? ?  ?Objective:  ?Physical Exam ?BP 112/73 (BP Location: Right Arm, Patient Position: Sitting, Cuff Size: Normal)   Pulse 87   Ht 5\' 5"  (1.651 m)   Wt 174 lb (78.9 kg)   LMP 12/09/2021 (Exact Date)   BMI 28.96 kg/m?  ?Physical Exam ?Exam conducted with a chaperone present.  ?Constitutional:   ?   General: She is not in acute distress. ?   Appearance: Normal appearance.  ?Cardiovascular:  ?   Rate and Rhythm: Normal rate.  ?Pulmonary:  ?   Effort: Pulmonary effort is normal.  ?Abdominal:  ?   General: Abdomen is flat. There is no distension.  ?   Palpations: Abdomen is soft. There is no mass.  ?   Tenderness: There is no abdominal tenderness. There is no guarding or rebound.  ?Genitourinary: ?   Vagina: Vaginal discharge (moderate mucous) present. No erythema, bleeding or lesions.  ?   Cervix: Normal.  ?Skin: ?   General: Skin is warm and dry.  ?   Coloration: Skin is not pale.  ?Neurological:  ?   Mental Status: She is alert and oriented to person, place, and time.  ?Psychiatric:     ?   Mood and Affect: Mood normal.  ? ? ?Health Maintenance Due  ?Topic Date Due  ? COVID-19 Vaccine (1) Never done  ? FOOT EXAM  Never done  ? OPHTHALMOLOGY EXAM  Never  done  ? URINE MICROALBUMIN  Never done  ? Hepatitis C Screening  Never done  ? ? ? ?Assessment & Plan:  ?1. Vaginal discharge ?- Cervicovaginal ancillary only( [place]) ? ?2. Vaginal itching ?- Cervicovaginal ancillary only( ) ? ?Patient will be contacted with results via MyChart ?Patient can return to the office PRN ? ?Approximately 20 minutes of total time was spent with this patient on chart review, history taking, physical exam, patient education on plan of care, coordination of care and documentation.  ? ?12/11/2021, PA-C ?12/30/2021 ?2:32 PM ? ?

## 2021-12-31 ENCOUNTER — Ambulatory Visit: Payer: 59 | Admitting: Sports Medicine

## 2021-12-31 DIAGNOSIS — M79672 Pain in left foot: Secondary | ICD-10-CM

## 2021-12-31 DIAGNOSIS — M722 Plantar fascial fibromatosis: Secondary | ICD-10-CM | POA: Diagnosis not present

## 2021-12-31 DIAGNOSIS — M7662 Achilles tendinitis, left leg: Secondary | ICD-10-CM | POA: Diagnosis not present

## 2021-12-31 MED ORDER — MELOXICAM 15 MG PO TABS: 15.0000 mg | ORAL_TABLET | Freq: Every day | ORAL | 0 refills | Status: DC

## 2021-12-31 NOTE — Patient Instructions (Signed)
Plantar fascial brace and night splint from [employer] ?

## 2021-12-31 NOTE — Progress Notes (Signed)
Subjective: ?Nicole [name] is a 41 y.o. female returns to office for follow up evaluation after left heel injection for plantar fasciitis, injection #1 administered 6 weeks ago patient states that the injection seems to help as well as the oral anti-inflammatory reports that when she finished that medication the pain slowly started to come back worse at the back of the heel as compared to the bottom or sides of the heel and admits that she has not consistently stretched over ice the area but has been very careful with her activities and shoes. ? ?Patient Active Problem List  ? Diagnosis Date Noted  ? Encounter for gynecological examination with Papanicolaou smear of cervix 12/21/2021  ? Encounter for screening fecal occult blood testing 12/21/2021  ? Diabetes mellitus (HCC) 03/23/2021  ? Statin declined 03/17/2021  ? Vitamin D deficiency 11/08/2019  ? Class 1 obesity with serious comorbidity and body mass index (BMI) of 34.0 to 34.9 in adult 09/29/2019  ? Other hyperlipidemia 09/27/2019  ? Prediabetes 09/27/2019  ? Rectocele 03/22/2019  ? Moody 08/21/2018  ? Fatigue 08/21/2018  ? Depression 08/21/2018  ? Screening cholesterol level 08/21/2018  ? Fetal macrosomia in pregnancy in third trimester 06/05/2013  ? Hypothyroidism 11/15/2012  ? ? ?Current Outpatient Medications on File Prior to Visit  ?Medication Sig Dispense Refill  ? Cholecalciferol (VITAMIN D) 125 MCG (5000 UT) CAPS Take 1 capsule by mouth daily. 30 capsule 0  ? clomiPRAMINE (ANAFRANIL) 25 MG capsule     ? famotidine (PEPCID) 10 MG tablet 1 tablet as needed    ? fexofenadine (ALLEGRA) 180 MG tablet Take 180 mg by mouth daily.    ? guanFACINE (INTUNIV) 2 MG TB24 ER tablet SMARTSIG:1 Tablet(s) By Mouth Every Evening    ? ibuprofen (ADVIL) 200 MG tablet Take 200 mg by mouth every 6 (six) hours as needed.    ? levothyroxine (SYNTHROID) 50 MCG tablet Take 50 mcg by mouth daily before breakfast.    ? magnesium 30 MG tablet Take 30 mg by mouth 2 (two) times  daily.    ? metFORMIN (GLUCOPHAGE XR) 500 MG 24 hr tablet Take 1 tablet (500 mg total) by mouth daily with breakfast. 90 tablet 0  ? Multiple Vitamin (MULTIVITAMIN) tablet Take 1 tablet by mouth daily.    ? VYVANSE 30 MG capsule Take 30 mg by mouth daily.    ? ?No current facility-administered medications on file prior to visit.  ? ? ?Allergies  ?Allergen Reactions  ? Bupropion Tinitus  ? ? ?Objective:  ? ?General:  Alert and oriented x 3, in no acute distress ? ?Dermatology: Skin is warm, dry and supple bilateral lower extremities. Nails 1-10 are normal. There is no erythema, edema, no eccymosis, no open lesions present. Integument is otherwise unremarkable. ?  ?Vascular: Dorsalis Pedis pulse and Posterior Tibial pulse are 1/4 bilateral.  Varicosities bilateral.  Capillary fill time is immediate to all digits. ?  ?Neurological: Grossly intact to light touch bilateral.  Subjective burning sensation likely along the calcaneal nerve of the left heel that appears to be improved from prior. ?  ?Musculoskeletal: No significant reproducible tenderness to palpation at the medial calcaneal tubercale and through the insertion of the plantar fascia on the left foot at this time however there is pain at the Achilles insertion and the most inferior and posterior heel on the left, Achilles feels intact with no palpable gap or dell. No pain with compression of calcaneus bilateral.  No pain with calf compression bilateral, Janee Morn  sign negative. There is decreased Ankle joint range of motion bilateral. All other joints range within normal limits. ? ?Assessment and Plan: ?Problem List Items Addressed This Visit   ?None ?Visit Diagnoses   ? ? Plantar fasciitis of left foot    -  Primary  ? Tendonitis, Achilles, left      ? Pain of left heel      ? ?  ? ? ?-Complete examination performed.  ?-Previous x-rays reviewed. ?-Re-Discussed with patient in detail the condition of Achilles tendinitis greater than plantar fasciitis, how this  occurs related to the foot type of the patient and general treatment options. ?-At this time since there were no obvious sore spots that were really bad on today's exam I did not give a another injection however I did advise patient to purchase a night splints and a plantar fascial brace off [employer] to help with her problem ?-Dispensed heel lifts to use as directed and advised patient to replace with over-the-counter heel lifts that can last her longer ?-Refilled meloxicam for patient to take as directed ?-Continue with stretching, icing, good supportive shoes daily.  ?-Discussed long term care and reocurrence; will closely monitor; if fails to improve will consider other treatment modalities.  ?-Patient to return to office in 1 month for follow up or sooner if pain has not improved. ? ?Asencion Islam, DPM ? ? ?

## 2022-01-01 LAB — CERVICOVAGINAL ANCILLARY ONLY
Bacterial Vaginitis (gardnerella): NEGATIVE
Candida Glabrata: NEGATIVE
Candida Vaginitis: NEGATIVE
Comment: NEGATIVE
Comment: NEGATIVE
Comment: NEGATIVE
Comment: NEGATIVE
Trichomonas: NEGATIVE

## 2022-02-04 ENCOUNTER — Ambulatory Visit: Payer: 59 | Admitting: Sports Medicine

## 2022-04-21 ENCOUNTER — Encounter (INDEPENDENT_AMBULATORY_CARE_PROVIDER_SITE_OTHER): Payer: Self-pay

## 2022-04-29 DIAGNOSIS — Z1231 Encounter for screening mammogram for malignant neoplasm of breast: Secondary | ICD-10-CM

## 2022-05-06 ENCOUNTER — Other Ambulatory Visit (INDEPENDENT_AMBULATORY_CARE_PROVIDER_SITE_OTHER): Payer: Self-pay | Admitting: Bariatrics

## 2022-05-06 DIAGNOSIS — E1169 Type 2 diabetes mellitus with other specified complication: Secondary | ICD-10-CM

## 2022-05-27 DIAGNOSIS — F902 Attention-deficit hyperactivity disorder, combined type: Secondary | ICD-10-CM | POA: Diagnosis not present

## 2022-05-27 DIAGNOSIS — F422 Mixed obsessional thoughts and acts: Secondary | ICD-10-CM | POA: Diagnosis not present

## 2022-05-27 DIAGNOSIS — R69 Illness, unspecified: Secondary | ICD-10-CM | POA: Diagnosis not present

## 2022-06-03 DIAGNOSIS — R69 Illness, unspecified: Secondary | ICD-10-CM | POA: Diagnosis not present

## 2022-06-14 DIAGNOSIS — F902 Attention-deficit hyperactivity disorder, combined type: Secondary | ICD-10-CM | POA: Diagnosis not present

## 2022-06-14 DIAGNOSIS — R69 Illness, unspecified: Secondary | ICD-10-CM | POA: Diagnosis not present

## 2022-06-14 DIAGNOSIS — F422 Mixed obsessional thoughts and acts: Secondary | ICD-10-CM | POA: Diagnosis not present

## 2022-06-24 DIAGNOSIS — R69 Illness, unspecified: Secondary | ICD-10-CM | POA: Diagnosis not present

## 2022-07-01 DIAGNOSIS — R69 Illness, unspecified: Secondary | ICD-10-CM | POA: Diagnosis not present

## 2022-07-15 DIAGNOSIS — R69 Illness, unspecified: Secondary | ICD-10-CM | POA: Diagnosis not present

## 2022-07-21 DIAGNOSIS — R69 Illness, unspecified: Secondary | ICD-10-CM | POA: Diagnosis not present

## 2022-07-21 DIAGNOSIS — F902 Attention-deficit hyperactivity disorder, combined type: Secondary | ICD-10-CM | POA: Diagnosis not present

## 2022-07-21 DIAGNOSIS — F422 Mixed obsessional thoughts and acts: Secondary | ICD-10-CM | POA: Diagnosis not present

## 2022-07-22 DIAGNOSIS — R69 Illness, unspecified: Secondary | ICD-10-CM | POA: Diagnosis not present

## 2022-07-27 DIAGNOSIS — R69 Illness, unspecified: Secondary | ICD-10-CM | POA: Diagnosis not present

## 2022-08-11 ENCOUNTER — Other Ambulatory Visit: Payer: 59

## 2022-08-11 DIAGNOSIS — E7849 Other hyperlipidemia: Secondary | ICD-10-CM

## 2022-08-11 DIAGNOSIS — E559 Vitamin D deficiency, unspecified: Secondary | ICD-10-CM

## 2022-08-11 DIAGNOSIS — R5383 Other fatigue: Secondary | ICD-10-CM | POA: Diagnosis not present

## 2022-08-11 DIAGNOSIS — R739 Hyperglycemia, unspecified: Secondary | ICD-10-CM | POA: Diagnosis not present

## 2022-08-11 DIAGNOSIS — E1169 Type 2 diabetes mellitus with other specified complication: Secondary | ICD-10-CM

## 2022-08-11 DIAGNOSIS — E038 Other specified hypothyroidism: Secondary | ICD-10-CM

## 2022-08-11 DIAGNOSIS — Z6834 Body mass index (BMI) 34.0-34.9, adult: Secondary | ICD-10-CM

## 2022-08-12 ENCOUNTER — Encounter: Payer: Self-pay | Admitting: Family Medicine

## 2022-08-12 ENCOUNTER — Ambulatory Visit (INDEPENDENT_AMBULATORY_CARE_PROVIDER_SITE_OTHER): Payer: 59 | Admitting: Family Medicine

## 2022-08-12 VITALS — BP 116/80 | HR 104 | Temp 97.8°F | Ht 65.0 in | Wt 203.0 lb

## 2022-08-12 DIAGNOSIS — R69 Illness, unspecified: Secondary | ICD-10-CM | POA: Diagnosis not present

## 2022-08-12 DIAGNOSIS — E6609 Other obesity due to excess calories: Secondary | ICD-10-CM

## 2022-08-12 DIAGNOSIS — E038 Other specified hypothyroidism: Secondary | ICD-10-CM

## 2022-08-12 DIAGNOSIS — R739 Hyperglycemia, unspecified: Secondary | ICD-10-CM | POA: Diagnosis not present

## 2022-08-12 DIAGNOSIS — Z0000 Encounter for general adult medical examination without abnormal findings: Secondary | ICD-10-CM | POA: Insufficient documentation

## 2022-08-12 DIAGNOSIS — Z6834 Body mass index (BMI) 34.0-34.9, adult: Secondary | ICD-10-CM | POA: Diagnosis not present

## 2022-08-12 DIAGNOSIS — Z23 Encounter for immunization: Secondary | ICD-10-CM | POA: Diagnosis not present

## 2022-08-12 DIAGNOSIS — R590 Localized enlarged lymph nodes: Secondary | ICD-10-CM

## 2022-08-12 DIAGNOSIS — F422 Mixed obsessional thoughts and acts: Secondary | ICD-10-CM | POA: Diagnosis not present

## 2022-08-12 DIAGNOSIS — F902 Attention-deficit hyperactivity disorder, combined type: Secondary | ICD-10-CM | POA: Diagnosis not present

## 2022-08-12 NOTE — Assessment & Plan Note (Signed)
Well controlled on Synthroid. She does report some fatigue and states that her TSH is high for her. She would like to follow up with her Endocrinologist about dosage adjustments.

## 2022-08-12 NOTE — Assessment & Plan Note (Signed)
Added on A1c to labs

## 2022-08-12 NOTE — Assessment & Plan Note (Signed)
Discussed importance of optimal weight for overall health. Encouraged heart healthy, calorie restricted diet such as DASH or Mediterranean diet. She is reluctant to modify her diet due to her psychological history with food. Encouraged 150 minutes of moderate intensity exercise weekly.

## 2022-08-12 NOTE — Progress Notes (Signed)
Complete physical exam  Patient: Nicole [name]   DOB: 1981-01-03   41 y.o. Female  MRN: [id number]  Subjective:    Chief Complaint  Patient presents with   Annual Exam    No pap, been done in [place] '23    Nicole [name] is a 41 y.o. female who presents today for a complete physical exam. She reports consuming a general diet, she does follow intuitive eating methods with her history of binging sweets. The patient does not participate in regular exercise at present. She generally feels well. She reports sleeping well. She does have additional problems to discuss today.  Nicole [name] has a history of hypothyroidism and is currently taking Synthroid 20mg daily with 1033m every 5 days. She does report some fatigue and believes this may be due to her slight elevation in her TSH, will check with Endocrinologist about dose adjustment. She does have a small bump on her right inner arm she found months ago, not painful, not changing in size, no redness, erythema, or drainage, no changes in sensation or mobility.  Normal mammogram 1 year ago Normal PAP this year with STI No tobacco, drugs, or ETOH  Most recent fall risk assessment:    08/12/2022    3:02 PM  Fall Risk   Falls in the past year? 0     Most recent depression screenings:    08/12/2022    3:16 PM 08/12/2022    3:01 PM  PHQ 2/9 Scores  PHQ - 2 Score 0 0  PHQ- 9 Score 5 0    Vision:Not within last year  and Dental: No current dental problems  Past Medical History:  Diagnosis Date   Anxiety    Back pain    Depression    GERD (gastroesophageal reflux disease)    Hypothyroidism    Palpitations    Pre-diabetes    Vitamin D deficiency    Past Surgical History:  Procedure Laterality Date   MOUTH SURGERY     Family History  Problem Relation Age of Onset   Thyroid disease Mother    Depression Mother    Hypertension Father    Depression Father    GER disease Sister    Breast cancer Maternal Aunt    Hypertension  Maternal Grandmother    Depression Maternal Grandmother    Depression Maternal Grandfather    Hypertension Paternal Grandmother    Anxiety disorder Paternal Grandmother    Macular degeneration Paternal Grandmother    Diabetes Paternal Grandfather    Allergies  Allergen Reactions   Bupropion Tinitus      Patient Care Team: Nicole FrizzleMD as PCP - General (Family Medicine)   Outpatient Medications Prior to Visit  Medication Sig   amphetamine-dextroamphetamine (ADDERALL XR) 15 MG 24 hr capsule Take 15 mg by mouth every morning.   cetirizine (ZYRTEC) 10 MG chewable tablet Chew 10 mg by mouth daily. OVER THE COUNTER   Cholecalciferol (VITAMIN D) 125 MCG (5000 UT) CAPS Take 1 capsule by mouth daily.   clomiPRAMINE (ANAFRANIL) 25 MG capsule    famotidine (PEPCID) 10 MG tablet 1 tablet as needed   guanFACINE (INTUNIV) 2 MG TB24 ER tablet SMARTSIG:1 Tablet(s) By Mouth Every Evening   ibuprofen (ADVIL) 200 MG tablet Take 200 mg by mouth every 6 (six) hours as needed.   levothyroxine (SYNTHROID) 50 MCG tablet Take 50 mcg by mouth daily before breakfast.   magnesium 30 MG tablet Take 30 mg by mouth 2 (two) times daily.  Multiple Vitamin (MULTIVITAMIN) tablet Take 1 tablet by mouth daily.   [DISCONTINUED] amphetamine-dextroamphetamine (ADDERALL) 10 MG tablet Take 10 mg by mouth daily with breakfast.   [DISCONTINUED] metFORMIN (GLUCOPHAGE XR) 500 MG 24 hr tablet Take 1 tablet (500 mg total) by mouth daily with breakfast.   [DISCONTINUED] fexofenadine (ALLEGRA) 180 MG tablet Take 180 mg by mouth daily. (Patient not taking: Reported on 08/12/2022)   [DISCONTINUED] meloxicam (MOBIC) 15 MG tablet Take 1 tablet (15 mg total) by mouth daily. (Patient not taking: Reported on 08/12/2022)   [DISCONTINUED] VYVANSE 30 MG capsule Take 30 mg by mouth daily. (Patient not taking: Reported on 08/12/2022)   No facility-administered medications prior to visit.    Review of Systems  Constitutional:   Positive for malaise/fatigue.  HENT: Negative.    Eyes: Negative.   Respiratory: Negative.    Cardiovascular: Negative.   Gastrointestinal: Negative.   Genitourinary: Negative.   Musculoskeletal: Negative.   Skin: Negative.   Neurological: Negative.   Endo/Heme/Allergies: Negative.   Psychiatric/Behavioral: Negative.            Objective:     BP 116/80   Pulse (!) 104   Temp 97.8 F (36.6 C) (Oral)   Ht _0  (1.651 m)   Wt 203 lb (92.1 kg)   LMP 07/09/2022 (Approximate)   SpO2 100%   BMI 33.78 kg/m  BP Readings from Last 3 Encounters:  08/12/22 116/80  12/30/21 112/73  12/21/21 109/70   Wt Readings from Last 3 Encounters:  08/12/22 203 lb (92.1 kg)  12/30/21 174 lb (78.9 kg)  12/21/21 174 lb (78.9 kg)    The 10-year ASCVD risk score (Arnett DK, et al., 2019) is: 1.4%   Values used to calculate the score:     Age: 61 years     Sex: Female     Is Non-Hispanic African American: No     Diabetic: Yes     Tobacco smoker: No     Systolic Blood Pressure: 539 mmHg     Is BP treated: No     HDL Cholesterol: 50 mg/dL     Total Cholesterol: 211 mg/dL   Physical Exam Vitals and nursing note reviewed.  Constitutional:      Appearance: Normal appearance. She is obese.  HENT:     Head: Normocephalic and atraumatic.     Right Ear: Tympanic membrane, ear canal and external ear normal.     Left Ear: Tympanic membrane, ear canal and external ear normal.     Nose: Nose normal.     Mouth/Throat:     Mouth: Mucous membranes are moist.     Pharynx: Oropharynx is clear.  Eyes:     Extraocular Movements: Extraocular movements intact.     Conjunctiva/sclera: Conjunctivae normal.     Pupils: Pupils are equal, round, and reactive to light.  Cardiovascular:     Rate and Rhythm: Normal rate and regular rhythm.     Pulses: Normal pulses.     Heart sounds: Normal heart sounds.  Pulmonary:     Effort: Pulmonary effort is normal.     Breath sounds: Normal breath sounds.   Abdominal:     General: Bowel sounds are normal.     Palpations: Abdomen is soft.  Musculoskeletal:        General: Normal range of motion.     Cervical back: Normal range of motion and neck supple.  Lymphadenopathy:     Upper Body:     Right upper body:  Epitrochlear adenopathy (0.5 cm) present.  Skin:    General: Skin is warm and dry.     Capillary Refill: Capillary refill takes less than 2 seconds.  Neurological:     General: No focal deficit present.     Mental Status: She is alert and oriented to person, place, and time. Mental status is at baseline.  Psychiatric:        Mood and Affect: Mood normal.        Behavior: Behavior normal.        Thought Content: Thought content normal.        Judgment: Judgment normal.      No results found for any visits on 08/12/22. Last CBC Lab Results  Component Value Date   WBC 7.3 08/11/2022   HGB 13.5 08/11/2022   HCT 40.5 08/11/2022   MCV 90.0 08/11/2022   MCH 30.0 08/11/2022   RDW 11.7 08/11/2022   PLT 364 26/71/2458   Last metabolic panel Lab Results  Component Value Date   GLUCOSE 211 (H) 08/11/2022   NA 136 08/11/2022   K 4.4 08/11/2022   CL 99 08/11/2022   CO2 27 08/11/2022   BUN 9 08/11/2022   CREATININE 0.63 08/11/2022   EGFR 114 08/11/2022   CALCIUM 10.0 08/11/2022   PROT 7.4 08/11/2022   ALBUMIN 4.7 07/27/2021   LABGLOB 2.5 07/27/2021   AGRATIO 1.9 07/27/2021   BILITOT 0.4 08/11/2022   ALKPHOS 70 07/27/2021   AST 15 08/11/2022   ALT 21 08/11/2022   Last lipids Lab Results  Component Value Date   CHOL 211 (H) 08/11/2022   HDL 50 08/11/2022   LDLCALC 123 (H) 08/11/2022   TRIG 238 (H) 08/11/2022   CHOLHDL 4.2 08/11/2022   Last hemoglobin A1c Lab Results  Component Value Date   HGBA1C 5.8 (H) 07/27/2021   Last thyroid functions Lab Results  Component Value Date   TSH 2.38 08/11/2022   T3TOTAL 114 07/28/2020   Last vitamin D Lab Results  Component Value Date   VD25OH 60 08/11/2022   Last  vitamin B12 and Folate Lab Results  Component Value Date   VITAMINB12 449 08/11/2022        Assessment & Plan:    Routine Health Maintenance and Physical Exam  Immunization History  Administered Date(s) Administered   Influenza,inj,Quad PF,6+ Mos 05/29/2013, 07/10/2019   Influenza-Unspecified 07/03/2015, 07/16/2016, 06/13/2018   Tdap 06/21/2013    Health Maintenance  Topic Date Due   COVID-19 Vaccine (1) Never done   FOOT EXAM  Never done   OPHTHALMOLOGY EXAM  Never done   Hepatitis C Screening  Never done   HEMOGLOBIN A1C  01/24/2022   INFLUENZA VACCINE  04/13/2022   DTaP/Tdap/Td (2 - Td or Tdap) 06/22/2023   Diabetic kidney evaluation - GFR measurement  08/12/2023   Diabetic kidney evaluation - Urine ACR  08/12/2023   PAP SMEAR-Modifier  12/21/2024   HIV Screening  Completed   HPV VACCINES  Aged Out    Discussed health benefits of physical activity, and encouraged her to engage in regular exercise appropriate for her age and condition.  Problem List Items Addressed This Visit       Endocrine   Hypothyroidism    Well controlled on Synthroid. She does report some fatigue and states that her TSH is high for her. She would like to follow up with her Endocrinologist about dosage adjustments.        Immune and Lymphatic   Epitrochlear lymphadenopathy  Other   Class 1 obesity with serious comorbidity and body mass index (BMI) of 34.0 to 34.9 in adult    Discussed importance of optimal weight for overall health. Encouraged heart healthy, calorie restricted diet such as DASH or Mediterranean diet. She is reluctant to modify her diet due to her psychological history with food. Encouraged 150 minutes of moderate intensity exercise weekly.      Relevant Medications   amphetamine-dextroamphetamine (ADDERALL XR) 15 MG 24 hr capsule   Physical exam, annual - Primary   Elevated serum glucose    Added on A1c to labs      Relevant Orders   Hemoglobin A1c   Return in  about 3 months (around 11/11/2022) for folow-up on labs and physical.     Rubie Maid, FNP

## 2022-08-13 ENCOUNTER — Encounter: Payer: Self-pay | Admitting: Family Medicine

## 2022-08-13 DIAGNOSIS — E1169 Type 2 diabetes mellitus with other specified complication: Secondary | ICD-10-CM

## 2022-08-14 LAB — HEMOGLOBIN A1C
Hgb A1c MFr Bld: 7.4 % of total Hgb — ABNORMAL HIGH (ref <5.7)
Mean Plasma Glucose: 166 mg/dL
eAG (mmol/L): 9.2 mmol/L

## 2022-08-14 LAB — COMPLETE METABOLIC PANEL WITH GFR
AG Ratio: 1.5 (calc) (ref 1.0–2.5)
ALT: 21 U/L (ref 6–29)
AST: 15 U/L (ref 10–30)
Albumin: 4.4 g/dL (ref 3.6–5.1)
Alkaline phosphatase (APISO): 63 U/L (ref 31–125)
BUN: 9 mg/dL (ref 7–25)
CO2: 27 mmol/L (ref 20–32)
Calcium: 10 mg/dL (ref 8.6–10.2)
Chloride: 99 mmol/L (ref 98–110)
Creat: 0.63 mg/dL (ref 0.50–0.99)
Globulin: 3 g/dL (calc) (ref 1.9–3.7)
Glucose, Bld: 211 mg/dL — ABNORMAL HIGH (ref 65–99)
Potassium: 4.4 mmol/L (ref 3.5–5.3)
Sodium: 136 mmol/L (ref 135–146)
Total Bilirubin: 0.4 mg/dL (ref 0.2–1.2)
Total Protein: 7.4 g/dL (ref 6.1–8.1)
eGFR: 114 mL/min/{1.73_m2} (ref >=60)

## 2022-08-14 LAB — LIPID PANEL
Cholesterol: 211 mg/dL — ABNORMAL HIGH (ref <200)
HDL: 50 mg/dL (ref >=50)
LDL Cholesterol (Calc): 123 mg/dL (calc) — ABNORMAL HIGH
Non-HDL Cholesterol (Calc): 161 mg/dL (calc) — ABNORMAL HIGH (ref <130)
Total CHOL/HDL Ratio: 4.2 (calc) (ref <5.0)
Triglycerides: 238 mg/dL — ABNORMAL HIGH (ref <150)

## 2022-08-14 LAB — TSH: TSH: 2.38 mIU/L

## 2022-08-14 LAB — CBC WITH DIFFERENTIAL/PLATELET
Absolute Monocytes: 621 cells/uL (ref 200–950)
Basophils Absolute: 51 cells/uL (ref 0–200)
Basophils Relative: 0.7 %
Eosinophils Absolute: 168 cells/uL (ref 15–500)
Eosinophils Relative: 2.3 %
HCT: 40.5 % (ref 35.0–45.0)
Hemoglobin: 13.5 g/dL (ref 11.7–15.5)
Lymphs Abs: 2497 cells/uL (ref 850–3900)
MCH: 30 pg (ref 27.0–33.0)
MCHC: 33.3 g/dL (ref 32.0–36.0)
MCV: 90 fL (ref 80.0–100.0)
MPV: 10.1 fL (ref 7.5–12.5)
Monocytes Relative: 8.5 %
Neutro Abs: 3964 cells/uL (ref 1500–7800)
Neutrophils Relative %: 54.3 %
Platelets: 364 10*3/uL (ref 140–400)
RBC: 4.5 10*6/uL (ref 3.80–5.10)
RDW: 11.7 % (ref 11.0–15.0)
Total Lymphocyte: 34.2 %
WBC: 7.3 10*3/uL (ref 3.8–10.8)

## 2022-08-14 LAB — MICROALBUMIN / CREATININE URINE RATIO
Creatinine, Urine: 137 mg/dL (ref 20–275)
Microalb Creat Ratio: 6 mcg/mg creat (ref <30)
Microalb, Ur: 0.8 mg/dL

## 2022-08-14 LAB — TEST AUTHORIZATION

## 2022-08-14 LAB — VITAMIN D 25 HYDROXY (VIT D DEFICIENCY, FRACTURES): Vit D, 25-Hydroxy: 60 ng/mL (ref 30–100)

## 2022-08-14 LAB — VITAMIN B12: Vitamin B-12: 449 pg/mL (ref 200–1100)

## 2022-08-16 ENCOUNTER — Encounter: Payer: Self-pay | Admitting: Family Medicine

## 2022-08-16 ENCOUNTER — Other Ambulatory Visit (INDEPENDENT_AMBULATORY_CARE_PROVIDER_SITE_OTHER): Payer: 59

## 2022-08-16 ENCOUNTER — Other Ambulatory Visit: Payer: Self-pay | Admitting: Family Medicine

## 2022-08-16 ENCOUNTER — Telehealth: Payer: Self-pay | Admitting: Family Medicine

## 2022-08-16 DIAGNOSIS — Z23 Encounter for immunization: Secondary | ICD-10-CM

## 2022-08-16 MED ORDER — OZEMPIC (0.25 OR 0.5 MG/DOSE) 2 MG/1.5ML ~~LOC~~ SOPN: PEN_INJECTOR | SUBCUTANEOUS | 0 refills | Status: DC

## 2022-08-16 NOTE — Telephone Encounter (Signed)
Discussed elevated A1c with patient. She has not tolerated Metformin in the past. Discussed her overall risk factors and ultimately we decided together to try Ozempic weekly injections. We discussed the black box warning and she does not know of any family history of medullary thyroid tumors. Will recheck A1c and follow-up in 3 months. Encouraged to return to office if not tolerating the medication and will start a more carb conscious diet.

## 2022-08-16 NOTE — Telephone Encounter (Signed)
Pls advice  

## 2022-08-18 ENCOUNTER — Other Ambulatory Visit: Payer: Self-pay | Admitting: Family Medicine

## 2022-09-26 ENCOUNTER — Encounter: Payer: Self-pay | Admitting: Family Medicine

## 2022-10-07 ENCOUNTER — Other Ambulatory Visit: Payer: Self-pay

## 2022-10-07 DIAGNOSIS — F32A Depression, unspecified: Secondary | ICD-10-CM

## 2022-10-07 MED ORDER — CLOMIPRAMINE HCL 25 MG PO CAPS: 25.0000 mg | ORAL_CAPSULE | Freq: Every day | ORAL | 0 refills | Status: DC

## 2022-10-12 ENCOUNTER — Other Ambulatory Visit: Payer: Self-pay | Admitting: Family Medicine

## 2022-10-22 ENCOUNTER — Telehealth: Payer: Self-pay | Admitting: Family Medicine

## 2022-10-22 NOTE — Telephone Encounter (Signed)
Patient called to request courtesy refill of meds being transferred from previous provider/counselor; requesting enough to carry her over until NP able to give her a full 30-day script. Please see notes from [facility].  Pharmacy confirmed as:   [id number] [place] [place] [place]Phone: [phone] Fax: [phone] #: [id number]   Meds requested:   guanFACINE (INTUNIV) 2 MG TB24 ER tablet [[id number]  amphetamine-dextroamphetamine (ADDERALL XR) 15 MG 24 hr capsule [[id number]  Hydroxazyne 10MG  Please advise at [phone]

## 2022-10-25 ENCOUNTER — Other Ambulatory Visit: Payer: Self-pay | Admitting: Family Medicine

## 2022-10-25 ENCOUNTER — Ambulatory Visit: Payer: 59 | Admitting: Family Medicine

## 2022-10-25 DIAGNOSIS — F429 Obsessive-compulsive disorder, unspecified: Secondary | ICD-10-CM

## 2022-10-25 DIAGNOSIS — F411 Generalized anxiety disorder: Secondary | ICD-10-CM

## 2022-10-25 DIAGNOSIS — F988 Other specified behavioral and emotional disorders with onset usually occurring in childhood and adolescence: Secondary | ICD-10-CM

## 2022-10-25 NOTE — Progress Notes (Unsigned)
Will refill patients Guanfacine and Adderall per Deveron Furlong recommendations at [facility]. Patient has been taking lower doses since that visit in October so will maintain current effective doses. Instructed that she will need an OV ASAP.

## 2022-10-28 ENCOUNTER — Encounter: Payer: Self-pay | Admitting: Family Medicine

## 2022-10-28 ENCOUNTER — Ambulatory Visit (INDEPENDENT_AMBULATORY_CARE_PROVIDER_SITE_OTHER): Payer: Medicaid Other | Admitting: Family Medicine

## 2022-10-28 VITALS — BP 120/86 | HR 93 | Temp 97.9°F | Ht 65.0 in | Wt 208.0 lb

## 2022-10-28 DIAGNOSIS — F902 Attention-deficit hyperactivity disorder, combined type: Secondary | ICD-10-CM

## 2022-10-28 DIAGNOSIS — Z79899 Other long term (current) drug therapy: Secondary | ICD-10-CM | POA: Diagnosis not present

## 2022-10-28 DIAGNOSIS — E1169 Type 2 diabetes mellitus with other specified complication: Secondary | ICD-10-CM | POA: Diagnosis not present

## 2022-10-28 DIAGNOSIS — F32A Depression, unspecified: Secondary | ICD-10-CM | POA: Diagnosis not present

## 2022-10-28 MED ORDER — GUANFACINE HCL ER 3 MG PO TB24: 3.0000 mg | ORAL_TABLET | Freq: Every evening | ORAL | 0 refills | Status: DC

## 2022-10-28 MED ORDER — AMPHETAMINE-DEXTROAMPHET ER 15 MG PO CP24: 15.0000 mg | ORAL_CAPSULE | ORAL | 0 refills | Status: DC

## 2022-10-28 MED ORDER — AMPHETAMINE-DEXTROAMPHETAMINE 10 MG PO TABS: 10.0000 mg | ORAL_TABLET | Freq: Every day | ORAL | 0 refills | Status: DC | PRN

## 2022-10-28 MED ORDER — HYDROXYZINE HCL 10 MG PO TABS: 10.0000 mg | ORAL_TABLET | Freq: Every day | ORAL | 0 refills | Status: DC

## 2022-10-28 MED ORDER — TRULICITY 0.75 MG/0.5ML ~~LOC~~ SOAJ: 0.7500 mg | SUBCUTANEOUS | 0 refills | Status: DC

## 2022-10-28 MED ORDER — CLOMIPRAMINE HCL 25 MG PO CAPS: 25.0000 mg | ORAL_CAPSULE | Freq: Every day | ORAL | 3 refills | Status: DC

## 2022-10-28 NOTE — Assessment & Plan Note (Signed)
Continue current regimen. No SI.

## 2022-10-28 NOTE — Assessment & Plan Note (Addendum)
Previously unable to obtain Trulicity, she has had a change in insurance and would like to try again. Will reorder Trulicity [id number] weekly and recheck A1c in 3 months. Confirmed no personal or family history of thyroid c-cell tumors or MEN2.

## 2022-10-28 NOTE — Assessment & Plan Note (Signed)
EKG and UDS done today. Continue current regimen. Refills provided. PDMP reviewed. Follow-up in 3 months.

## 2022-10-28 NOTE — Progress Notes (Signed)
Acute Office Visit  Subjective:     Patient ID: Nicole [name], female    DOB: [birth year], 42 y.o.   MRN: [id number]  Chief Complaint  Patient presents with   Follow-up    Meds refill/EKG    HPI Patient is in today for follow-up for ADHD medication management. Will obtain EKG and UDS today. Psychiatry notes have been reviewed and will reorder her prior medications. PDMP reviewed. Her ADHD is well controlled on current regimen. She also requests refills of her Anafranil and Hydroxyzine and reports her anxiety/depression is well controlled on this regimen as well. She would like to continue to follow up with Psychiatry but her insurance will not cover medications prescribed by her current provider. Denies palpitations, chest pain, loss of appetite, suicidal ideations.  Review of Systems  Constitutional:  Negative for diaphoresis and weight loss.  Genitourinary:  Negative for frequency.  Neurological:  Negative for sensory change and headaches.  Endo/Heme/Allergies:  Negative for polydipsia.  Psychiatric/Behavioral:  Negative for suicidal ideas.   All other systems reviewed and are negative.       Objective:    BP 120/86   Pulse 93   Temp 97.9 F (36.6 C) (Oral)   Ht 5' 5"$  (1.651 m)   Wt 208 lb (94.3 kg)   LMP 09/21/2022 (Approximate)   SpO2 97%   BMI 34.61 kg/m    Physical Exam Vitals and nursing note reviewed.  Constitutional:      Appearance: Normal appearance. She is normal weight.  HENT:     Head: Normocephalic and atraumatic.  Skin:    General: Skin is warm and dry.  Neurological:     General: No focal deficit present.     Mental Status: She is alert and oriented to person, place, and time. Mental status is at baseline.  Psychiatric:        Mood and Affect: Mood normal.        Behavior: Behavior normal.        Thought Content: Thought content normal.        Judgment: Judgment normal.     No results found for any visits on 10/28/22.      Assessment &  Plan:   Problem List Items Addressed This Visit       Endocrine   Diabetes mellitus ([place])    Previously unable to obtain Trulicity, she has had a change in insurance and would like to try again. Will reorder Trulicity [id number] weekly and recheck A1c in 3 months. Confirmed no personal or family history of thyroid c-cell tumors or MEN2.      Relevant Medications   Dulaglutide (TRULICITY) A999333 [id number] SOPN   Other Relevant Orders   Amb Referral to Nutrition and Diabetic Education     Other   Depression    Continue current regimen. No SI.      Relevant Medications   hydrOXYzine (ATARAX) 10 MG tablet   clomiPRAMINE (ANAFRANIL) 25 MG capsule   Attention deficit hyperactivity disorder (ADHD), combined type - Primary    EKG and UDS done today. Continue current regimen. Refills provided. PDMP reviewed. Follow-up in 3 months.      Relevant Orders   EKG 12-Lead (Completed)   DRUG MONITOR, PANEL 1, W/CONF, URINE   Other Visit Diagnoses     Controlled substance agreement signed           Meds ordered this encounter  Medications   clomiPRAMINE (ANAFRANIL) 25 MG capsule    Sig: Take  1 capsule (25 mg total) by mouth daily.    Dispense:  90 capsule    Refill:  3    Order Specific Question:   Supervising Provider    Answer:   Jenna Luo T [[id number]]   Dulaglutide (TRULICITY) [id number] SOPN    Sig: Inject 0.75 mg into the skin once a week.    Dispense:  6 mL    Refill:  0    Order Specific Question:   Supervising Provider    Answer:   Jenna Luo T [id number]    Return in about 3 months (around 01/26/2023) for ADHD, DM, HLD.  Rubie Maid, FNP

## 2022-10-30 LAB — DRUG MONITOR, PANEL 1, W/CONF, URINE
Amphetamine: 4080 ng/mL — ABNORMAL HIGH (ref <250)
Amphetamines: POSITIVE ng/mL — AB (ref <500)
Barbiturates: NEGATIVE ng/mL (ref <300)
Benzodiazepines: NEGATIVE ng/mL (ref <100)
Cocaine Metabolite: NEGATIVE ng/mL (ref <150)
Creatinine: 162.9 mg/dL (ref >=20.0)
Marijuana Metabolite: NEGATIVE ng/mL (ref <20)
Methadone Metabolite: NEGATIVE ng/mL (ref <100)
Methamphetamine: NEGATIVE ng/mL (ref <250)
Opiates: NEGATIVE ng/mL (ref <100)
Oxidant: NEGATIVE ug/mL (ref <200)
Oxycodone: NEGATIVE ng/mL (ref <100)
Phencyclidine: NEGATIVE ng/mL (ref <25)
pH: 6.6 (ref 4.5–9.0)

## 2022-10-30 LAB — DM TEMPLATE

## 2022-11-03 ENCOUNTER — Encounter: Payer: Self-pay | Admitting: Family Medicine

## 2022-11-15 ENCOUNTER — Encounter: Payer: Self-pay | Admitting: Family Medicine

## 2022-11-15 ENCOUNTER — Other Ambulatory Visit: Payer: Self-pay | Admitting: Family Medicine

## 2022-11-15 ENCOUNTER — Other Ambulatory Visit: Payer: 59

## 2022-11-15 DIAGNOSIS — Z1231 Encounter for screening mammogram for malignant neoplasm of breast: Secondary | ICD-10-CM

## 2022-11-16 ENCOUNTER — Other Ambulatory Visit: Payer: Self-pay | Admitting: Family Medicine

## 2022-11-16 DIAGNOSIS — K219 Gastro-esophageal reflux disease without esophagitis: Secondary | ICD-10-CM

## 2022-11-17 ENCOUNTER — Encounter (INDEPENDENT_AMBULATORY_CARE_PROVIDER_SITE_OTHER): Payer: Self-pay | Admitting: *Deleted

## 2022-11-23 ENCOUNTER — Other Ambulatory Visit: Payer: Self-pay | Admitting: Family Medicine

## 2022-11-23 MED ORDER — AMPHETAMINE-DEXTROAMPHET ER 15 MG PO CP24: 15.0000 mg | ORAL_CAPSULE | ORAL | 0 refills | Status: DC

## 2022-11-23 MED ORDER — GUANFACINE HCL ER 3 MG PO TB24: 3.0000 mg | ORAL_TABLET | Freq: Every evening | ORAL | 0 refills | Status: DC

## 2022-12-01 ENCOUNTER — Other Ambulatory Visit: Payer: Self-pay | Admitting: Family Medicine

## 2022-12-01 ENCOUNTER — Ambulatory Visit: Payer: Medicaid Other | Admitting: Registered"

## 2022-12-20 ENCOUNTER — Other Ambulatory Visit: Payer: Self-pay | Admitting: Family Medicine

## 2022-12-22 ENCOUNTER — Encounter: Payer: Self-pay | Admitting: Registered"

## 2022-12-22 ENCOUNTER — Encounter: Payer: Medicaid Other | Attending: Family Medicine | Admitting: Registered"

## 2022-12-22 DIAGNOSIS — E119 Type 2 diabetes mellitus without complications: Secondary | ICD-10-CM | POA: Diagnosis not present

## 2022-12-22 NOTE — Patient Instructions (Signed)
-   Aim to have balanced lunch and dinner to include 1/2 plate of non-starchy vegetables + 1/4 plate of lean protein + 1/4 plate of carbohydrates.

## 2022-12-22 NOTE — Progress Notes (Signed)
Diabetes Self-Management Education  Visit Type: First/Initial  Appt. Start Time: 11:30 Appt. End Time: 12:30  12/22/2022  Ms. Nicole JunglingSarah [name], identified by name and date of birth, is a 42 y.o. female with a diagnosis of Diabetes: Type 2.   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   States she is prescribed metformin XR 500 mg and it upsets her stomach; causes diarrhea. States she is trying to take a portion of metformin daily but has missed a few days. States she also tried Trulicity shot and it didn't go well either.   States she knew she was prediabetic for while and does not want diabetes to get worse. Reports she has been following diet rules and has history of binge eating. States she has a Veterinary surgeoncounselor helping her work with her emotional challenges.   States she experiences hypolgycemia when she doesn't take her medication. Happens about 10x/month. Reports it is hard to determine if Adderall or hyperglycemia is the source of increased thirst because they both impact her thirst.   States she was diagnosed with ADHD 3 years ago and still learning how to process. Reports she feels best to have easy things to grab that she knows is fueling her best. Reports planning ahead can be hard and makes quick meal decisions at times for herself and 3 children (ages 469,12, and 2115). States husband is following a diet/exercise plan and has his own meals. States this is triggering some of her previous dieting thoughts.   Received glucometer: AccuChek Guide Me Lot [id number] Exp 02/29/2024  There were no vitals taken for this visit. There is no height or weight on file to calculate BMI.   Diabetes Self-Management Education - 12/22/22 1137       Visit Information   Visit Type First/Initial      Initial Visit   Diabetes Type Type 2    Date Diagnosed 08/2022    Are you currently following a meal plan? No    Are you taking your medications  as prescribed? No      Health Coping   How would you rate your overall health? Good      Psychosocial Assessment   Patient Belief/Attitude about Diabetes Motivated to manage diabetes    What is the hardest part about your diabetes right now, causing you the most concern, or is the most worrisome to you about your diabetes?   Making healty food and beverage choices    Self-care barriers None    Self-management support Family    Other persons present Patient    Patient Concerns Nutrition/Meal planning    Special Needs None    Preferred Learning Style No preference indicated    Learning Readiness Ready    How often do you need to have someone help you when you read instructions, pamphlets, or other written materials from your doctor or pharmacy? 1 - Never    What is the last grade level you completed in school? bachelor's degree      Complications   Last HgB A1C per patient/outside source 7.4 %    How often do you check your blood sugar? 0 times/day (not testing)    Number of hypoglycemic episodes per month 10    Can you tell when your blood sugar is low? Yes    What do you do if your blood sugar is low? a cheesestick or TBS of peanut butter    Number of hyperglycemic episodes ( >  200mg /dL): Occasional    Can you tell when your blood sugar is high? No    Have you had a dilated eye exam in the past 12 months? No    Have you had a dental exam in the past 12 months? Yes    Are you checking your feet? No      Dietary Intake   Breakfast 8 am - 2 slices of [employer] bread toasted with honey + PB    Snack (morning) 10 am - cheesestick    Lunch 12-1 pm - trail mix + cheese or strawberry greek yogurt + granola + frozen blueberries    Dinner 6 pm - veggie pasta + sauce + ground [place] + ceasar salad or chicken nuggets + apple/orange or toast    Snack (evening) 9 pm - skinny pop or popcorn with olive oil and small amount of salt    Beverage(s) coffee, cappuccino, water (40 oz); 60+ oz       Activity / Exercise   Activity / Exercise Type ADL's    How many days per week do you exercise? 0    How many minutes per day do you exercise? 0    Total minutes per week of exercise 0      Patient Education   Previous Diabetes Education No    Disease Pathophysiology Definition of diabetes, type 1 and 2, and the diagnosis of diabetes;Factors that contribute to the development of diabetes    Healthy Eating Role of diet in the treatment of diabetes and the relationship between the three main macronutrients and blood glucose level;Plate Method;Effects of alcohol on blood glucose and safety factors with consumption of alcohol.    Monitoring Taught/discussed recording of test results and interpretation of SMBG.;Identified appropriate SMBG and/or A1C goals.;Interpreting lab values - A1C, lipid, urine microalbumina.    Acute complications Taught prevention, symptoms, and  treatment of hypoglycemia - the 15 rule.;Discussed and identified patients' prevention, symptoms, and treatment of hyperglycemia.    Chronic complications Lipid levels, blood glucose control and heart disease;Applicable immunizations    Diabetes Stress and Support Identified and addressed patients feelings and concerns about diabetes;Role of stress on diabetes    Lifestyle and Health Coping Lifestyle issues that need to be addressed for better diabetes care      Individualized Goals (developed by patient)   Nutrition Follow meal plan discussed    Medications take my medication as prescribed    Monitoring  Test my blood glucose as discussed    Problem Solving Eating Pattern      Post-Education Assessment   Patient understands the diabetes disease and treatment process. Demonstrates understanding / competency    Patient understands incorporating nutritional management into lifestyle. Comprehends key points    Patient undertands incorporating physical activity into lifestyle. Needs Review    Patient understands using medications  safely. Needs Review    Patient understands monitoring blood glucose, interpreting and using results Comprehends key points    Patient understands prevention, detection, and treatment of acute complications. Comprehends key points    Patient understands prevention, detection, and treatment of chronic complications. Needs Review    Patient understands how to develop strategies to address psychosocial issues. Demonstrates understanding / competency    Patient understands how to develop strategies to promote health/change behavior. Demonstrates understanding / competency      Outcomes   Expected Outcomes Demonstrated interest in learning. Expect positive outcomes    Future DMSE 2 months    Program Status Not Completed  Individualized Plan for Diabetes Self-Management Training:   Learning Objective:  Patient will have a greater understanding of diabetes self-management. Patient education plan is to attend individual and/or group sessions per assessed needs and concerns.   Plan:   Patient Instructions  - Aim to have balanced lunch and dinner to include 1/2 plate of non-starchy vegetables + 1/4 plate of lean protein + 1/4 plate of carbohydrates.     Expected Outcomes:  Demonstrated interest in learning. Expect positive outcomes  Education material provided: ADA - How to Thrive: A Guide for Your Journey with Diabetes  If problems or questions, patient to contact team via:  Phone and Email  Future DSME appointment: 2 months

## 2022-12-23 ENCOUNTER — Encounter: Payer: Self-pay | Admitting: Family Medicine

## 2022-12-23 MED ORDER — ACCU-CHEK GUIDE VI STRP: ORAL_STRIP | 12 refills | Status: DC

## 2022-12-27 ENCOUNTER — Other Ambulatory Visit: Payer: 59

## 2023-01-01 ENCOUNTER — Other Ambulatory Visit: Payer: Self-pay | Admitting: Family Medicine

## 2023-01-03 ENCOUNTER — Ambulatory Visit
Admission: RE | Admit: 2023-01-03 | Discharge: 2023-01-03 | Disposition: A | Discharge status: Home / Self Care | Payer: Medicaid Other | Source: Ambulatory Visit

## 2023-01-03 DIAGNOSIS — Z1231 Encounter for screening mammogram for malignant neoplasm of breast: Secondary | ICD-10-CM | POA: Diagnosis not present

## 2023-01-03 MED ORDER — AMPHETAMINE-DEXTROAMPHETAMINE 10 MG PO TABS: 10.0000 mg | ORAL_TABLET | Freq: Every day | ORAL | 0 refills | Status: DC | PRN

## 2023-01-03 MED ORDER — AMPHETAMINE-DEXTROAMPHET ER 15 MG PO CP24: 15.0000 mg | ORAL_CAPSULE | ORAL | 0 refills | Status: DC

## 2023-01-05 ENCOUNTER — Other Ambulatory Visit: Payer: Self-pay | Admitting: Family Medicine

## 2023-01-06 ENCOUNTER — Encounter (INDEPENDENT_AMBULATORY_CARE_PROVIDER_SITE_OTHER): Payer: Self-pay | Admitting: Gastroenterology

## 2023-01-06 ENCOUNTER — Telehealth (INDEPENDENT_AMBULATORY_CARE_PROVIDER_SITE_OTHER): Payer: Self-pay | Admitting: Gastroenterology

## 2023-01-06 ENCOUNTER — Ambulatory Visit (INDEPENDENT_AMBULATORY_CARE_PROVIDER_SITE_OTHER): Payer: Medicaid Other | Admitting: Gastroenterology

## 2023-01-06 VITALS — BP 119/81 | HR 85 | Temp 98.6°F | Ht 65.0 in | Wt 208.2 lb

## 2023-01-06 DIAGNOSIS — K219 Gastro-esophageal reflux disease without esophagitis: Secondary | ICD-10-CM

## 2023-01-06 MED ORDER — ESOMEPRAZOLE MAGNESIUM 40 MG PO CPDR: 40.0000 mg | DELAYED_RELEASE_CAPSULE | Freq: Every day | ORAL | 3 refills | Status: DC

## 2023-01-06 NOTE — Progress Notes (Signed)
Nicole [name], M.D. Gastroenterology & Hepatology [facility] Gastroenterology [address] [place], [place] [address] Primary Care Physician: Park Meo, FNP [address] [place] [address]  Referring MD: PCP  Chief Complaint:  GERD  History of Present Illness: Forest Pruden is a 42 y.o. female with PMH GERD, DM, depression, hypothyroidism, who presents for evaluation of GERD.  Patient reports that since her mid 30s she has presented recurrent episodes of heartburn. She describes a chronic history of burning sensation in her mid chest. She states having 3-4 episodes of heartburn per day when not on PPI. Occasionally has regurgitation of acid in her mouth but no sour taste. No coughing or hoarseness. She states she has been switching between omeprazole and esomeprazole for multiple years. She does not remember why she switched from omeprazole to esomeprazole. While on esomeprazole, she may have an episode of heartburn every 2-3 days - she actually reached this symptom improvement while taking esomeprazole 20 mg OTC at bedtime in combination with famotidine 20 mg at bedtime. She has been on this combination for several months -has had a couple of breaks in between to see how she did but symptoms recurred severely at night so she decided to go back on combination treatment.   Patient reports that even when she lost weight, she was still presenting frequent heartburn.  The patient denies having any nausea, vomiting, fever, chills, hematochezia, melena, hematemesis, abdominal distention, abdominal pain, diarrhea, jaundice, pruritus or weight loss.  Last XBJ:YNWGN Last Colonoscopy:never  FHx: neg for any gastrointestinal/liver disease, no malignancies Social: neg smoking, alcohol or illicit drug use Surgical: no abdominal surgeries  Past Medical History: Past Medical History:  Diagnosis Date   Anxiety    Back pain    Depression    Diabetes  mellitus without complication    GERD (gastroesophageal reflux disease)    Hypothyroidism    Palpitations    Pre-diabetes    Vitamin D deficiency     Past Surgical History: Past Surgical History:  Procedure Laterality Date   MOUTH SURGERY      Family History: Family History  Problem Relation Age of Onset   Thyroid disease Mother    Depression Mother    Hypertension Father    Depression Father    GER disease Sister    Breast cancer Maternal Aunt    Hypertension Maternal Grandmother    Depression Maternal Grandmother    Depression Maternal Grandfather    Hypertension Paternal Grandmother    Anxiety disorder Paternal Grandmother    Macular degeneration Paternal Grandmother    Diabetes Paternal Grandfather     Social History: Social History   Tobacco Use  Smoking Status Never  Smokeless Tobacco Never   Social History   Substance and Sexual Activity  Alcohol Use No   Social History   Substance and Sexual Activity  Drug Use No    Allergies: Allergies  Allergen Reactions   Bupropion Tinitus    Medications: Current Outpatient Medications  Medication Sig Dispense Refill   amphetamine-dextroamphetamine (ADDERALL XR) 15 MG 24 hr capsule Take 1 capsule by mouth every morning. 30 capsule 0   amphetamine-dextroamphetamine (ADDERALL) 10 MG tablet Take 1 tablet (10 mg total) by mouth daily as needed (uncontrolled ADHD). 30 tablet 0   Cholecalciferol (VITAMIN D) 125 MCG (5000 UT) CAPS Take 1 capsule by mouth daily. 30 capsule 0   clomiPRAMINE (ANAFRANIL) 25 MG capsule Take 1 capsule (25 mg total) by mouth  daily. (Patient taking differently: Take 50 mg by mouth daily.) 90 capsule 3   famotidine (PEPCID) 10 MG tablet Take 10 mg by mouth at bedtime.     GuanFACINE HCl 3 MG TB24 TAKE 1 TABLET BY MOUTH EVERY EVENING. 90 tablet 1   hydrOXYzine (ATARAX) 10 MG tablet Take 10 mg by mouth 3 (three) times daily as needed.     ibuprofen (ADVIL) 200 MG tablet Take 200 mg by mouth  every 6 (six) hours as needed.     levothyroxine (SYNTHROID) 50 MCG tablet Take 50 mcg by mouth daily before breakfast.     magnesium 30 MG tablet Take 30 mg by mouth 2 (two) times daily.     metFORMIN (GLUCOPHAGE-XR) 500 MG 24 hr tablet Take 250 mg by mouth daily with breakfast.     Multiple Vitamin (MULTIVITAMIN) tablet Take 1 tablet by mouth daily.     glucose blood (ACCU-CHEK GUIDE) test strip Use as instructed 100 each 12   No current facility-administered medications for this visit.    Review of Systems: GENERAL: negative for malaise, night sweats HEENT: No changes in hearing or vision, no nose bleeds or other nasal problems. NECK: Negative for lumps, goiter, pain and significant neck swelling RESPIRATORY: Negative for cough, wheezing CARDIOVASCULAR: Negative for chest pain, leg swelling, palpitations, orthopnea GI: SEE HPI MUSCULOSKELETAL: Negative for joint pain or swelling, back pain, and muscle pain. SKIN: Negative for lesions, rash PSYCH: Negative for sleep disturbance, mood disorder and recent psychosocial stressors. HEMATOLOGY Negative for prolonged bleeding, bruising easily, and swollen nodes. ENDOCRINE: Negative for cold or heat intolerance, polyuria, polydipsia and goiter. NEURO: negative for tremor, gait imbalance, syncope and seizures. The remainder of the review of systems is noncontributory.   Physical Exam: BP 119/81 (BP Location: Left Arm, Patient Position: Sitting, Cuff Size: Large)   Pulse 85   Temp 98.6 F (37 C) (Temporal)   Ht  (1.651 m)   Wt 208 lb 3.2 oz (94.4 kg)   BMI 34.65 kg/m  GENERAL: The patient is AO x3, in no acute distress. HEENT: Head is normocephalic and atraumatic. EOMI are intact. Mouth is well hydrated and without lesions. NECK: Supple. No masses LUNGS: Clear to auscultation. No presence of rhonchi/wheezing/rales. Adequate chest expansion HEART: RRR, normal s1 and s2. ABDOMEN: Soft, nontender, no guarding, no peritoneal signs,  and nondistended. BS +. No masses. EXTREMITIES: Without any cyanosis, clubbing, rash, lesions or edema. NEUROLOGIC: AOx3, no focal motor deficit. SKIN: no jaundice, no rashes   Imaging/Labs: as above  I personally reviewed and interpreted the available labs, imaging and endoscopic files.  Impression and Plan: Kwanza Cancelliere is a 42 y.o. female with PMH GERD, DM, depression, hypothyroidism, who presents for evaluation of GERD.  Patient has presented history of GERD which has been managed with different PPIs.  Currently taking combination of PPI and anti-H2 primary liver disease with some persistence of symptoms.  We discussed that given her longstanding symptoms and uncontrolled reflux episodes, we will need to investigate this further with an esophagogastroduodenospy.  For now, we will increase her omeprazole to 40 mg in the morning and will continue with bedtime dose of OTC famotidine.  General recommendations were given to patient  The patient and I held a thorough discussion about potential nonpharmacologic treatments for reflux such as transoral Incisionless Fundoplication (TIF).  The details of the procedure, benefits and risks, as well as prognosis with this intervention was thoroughly discussed with the patient.  The patient will read  more about this procedure - pamphlet provided.  -Schedule EGD -Increase esomeprazole to 40 mg in AM  -Explained presumed etiology of reflux symptoms. Instruction provided in the use of antireflux medication - patient should take esomeprazole in the morning 30-45 minutes before eating breakfast. Discussed avoidance of eating within 2 hours of lying down to sleep and benefit of blocks to elevate head of bed. Also, will benefit from avoiding carbonated drinks/sodas or food that has tomatoes, spicy or greasy food. -Continue famotidine 20 mg at bedtime  All questions were answered.      Nicole Blazing, MD Gastroenterology and Hepatology [facility]  Gastroenterology

## 2023-01-06 NOTE — Patient Instructions (Signed)
Schedule EGD Increase esomeprazole to 40 mg in AM  Explained presumed etiology of reflux symptoms. Instruction provided in the use of antireflux medication - patient should take esomeprazole in the morning 30-45 minutes before eating breakfast. Discussed avoidance of eating within 2 hours of lying down to sleep and benefit of blocks to elevate head of bed. Also, will benefit from avoiding carbonated drinks/sodas or food that has tomatoes, spicy or greasy food. Continue famotidine 20 mg at bedtime TIF pamphlet provided

## 2023-01-06 NOTE — Telephone Encounter (Signed)
PA form faxed to [facility]. Await decision.

## 2023-01-25 ENCOUNTER — Other Ambulatory Visit: Payer: Self-pay | Admitting: Family Medicine

## 2023-01-25 ENCOUNTER — Encounter: Payer: Self-pay | Admitting: Family Medicine

## 2023-01-25 DIAGNOSIS — E1169 Type 2 diabetes mellitus with other specified complication: Secondary | ICD-10-CM

## 2023-01-26 ENCOUNTER — Encounter: Payer: Self-pay | Admitting: Family Medicine

## 2023-01-26 ENCOUNTER — Other Ambulatory Visit: Payer: Medicaid Other

## 2023-01-26 DIAGNOSIS — E1169 Type 2 diabetes mellitus with other specified complication: Secondary | ICD-10-CM

## 2023-01-26 LAB — CBC WITH DIFFERENTIAL/PLATELET
Basophils Absolute: 51 cells/uL (ref 0–200)
MPV: 10.1 fL (ref 7.5–12.5)
Platelets: 348 10*3/uL (ref 140–400)
RDW: 11.8 % (ref 11.0–15.0)

## 2023-01-27 LAB — COMPLETE METABOLIC PANEL WITH GFR
AG Ratio: 1.8 (calc) (ref 1.0–2.5)
ALT: 34 U/L — ABNORMAL HIGH (ref 6–29)
AST: 17 U/L (ref 10–30)
Albumin: 4.2 g/dL (ref 3.6–5.1)
Alkaline phosphatase (APISO): 61 U/L (ref 31–125)
BUN: 8 mg/dL (ref 7–25)
CO2: 28 mmol/L (ref 20–32)
Calcium: 9.6 mg/dL (ref 8.6–10.2)
Chloride: 99 mmol/L (ref 98–110)
Creat: 0.55 mg/dL (ref 0.50–0.99)
Globulin: 2.4 g/dL (calc) (ref 1.9–3.7)
Glucose, Bld: 193 mg/dL — ABNORMAL HIGH (ref 65–99)
Potassium: 4.2 mmol/L (ref 3.5–5.3)
Sodium: 136 mmol/L (ref 135–146)
Total Bilirubin: 0.4 mg/dL (ref 0.2–1.2)
Total Protein: 6.6 g/dL (ref 6.1–8.1)
eGFR: 117 mL/min/{1.73_m2} (ref >=60)

## 2023-01-27 LAB — CBC WITH DIFFERENTIAL/PLATELET
Absolute Monocytes: 663 cells/uL (ref 200–950)
Basophils Relative: 0.6 %
Eosinophils Absolute: 221 cells/uL (ref 15–500)
Eosinophils Relative: 2.6 %
HCT: 37 % (ref 35.0–45.0)
Hemoglobin: 12.4 g/dL (ref 11.7–15.5)
Lymphs Abs: 2542 cells/uL (ref 850–3900)
MCH: 30 pg (ref 27.0–33.0)
MCHC: 33.5 g/dL (ref 32.0–36.0)
MCV: 89.6 fL (ref 80.0–100.0)
Monocytes Relative: 7.8 %
Neutro Abs: 5024 cells/uL (ref 1500–7800)
Neutrophils Relative %: 59.1 %
RBC: 4.13 10*6/uL (ref 3.80–5.10)
Total Lymphocyte: 29.9 %
WBC: 8.5 10*3/uL (ref 3.8–10.8)

## 2023-01-27 LAB — HEMOGLOBIN A1C
Hgb A1c MFr Bld: 7.9 % of total Hgb — ABNORMAL HIGH (ref <5.7)
Mean Plasma Glucose: 180 mg/dL
eAG (mmol/L): 10 mmol/L

## 2023-01-27 LAB — LIPID PANEL
Cholesterol: 154 mg/dL (ref <200)
HDL: 46 mg/dL — ABNORMAL LOW (ref >=50)
LDL Cholesterol (Calc): 83 mg/dL (calc)
Non-HDL Cholesterol (Calc): 108 mg/dL (calc) (ref <130)
Total CHOL/HDL Ratio: 3.3 (calc) (ref <5.0)
Triglycerides: 146 mg/dL (ref <150)

## 2023-02-02 ENCOUNTER — Ambulatory Visit: Payer: Medicaid Other | Admitting: Family Medicine

## 2023-02-04 ENCOUNTER — Other Ambulatory Visit: Payer: Self-pay | Admitting: Family Medicine

## 2023-02-04 ENCOUNTER — Other Ambulatory Visit: Payer: Self-pay

## 2023-02-08 ENCOUNTER — Ambulatory Visit: Payer: Medicaid Other | Admitting: Registered"

## 2023-02-08 MED ORDER — AMPHETAMINE-DEXTROAMPHET ER 15 MG PO CP24: 15.0000 mg | ORAL_CAPSULE | ORAL | 0 refills | Status: DC

## 2023-02-08 MED ORDER — METFORMIN HCL ER 500 MG PO TB24: 500.0000 mg | ORAL_TABLET | Freq: Every day | ORAL | 0 refills | Status: DC

## 2023-02-08 MED ORDER — AMPHETAMINE-DEXTROAMPHETAMINE 10 MG PO TABS: 10.0000 mg | ORAL_TABLET | Freq: Every day | ORAL | 0 refills | Status: DC | PRN

## 2023-02-09 ENCOUNTER — Ambulatory Visit: Payer: Medicaid Other | Admitting: Family Medicine

## 2023-02-10 ENCOUNTER — Ambulatory Visit: Payer: Medicaid Other | Admitting: Family Medicine

## 2023-02-15 ENCOUNTER — Ambulatory Visit: Payer: Medicaid Other | Admitting: Family Medicine

## 2023-02-15 ENCOUNTER — Encounter: Payer: Self-pay | Admitting: Family Medicine

## 2023-02-15 VITALS — BP 124/72 | HR 93 | Temp 97.5°F | Ht 65.0 in | Wt 208.0 lb

## 2023-02-15 DIAGNOSIS — Z7984 Long term (current) use of oral hypoglycemic drugs: Secondary | ICD-10-CM

## 2023-02-15 DIAGNOSIS — E1169 Type 2 diabetes mellitus with other specified complication: Secondary | ICD-10-CM

## 2023-02-15 DIAGNOSIS — F902 Attention-deficit hyperactivity disorder, combined type: Secondary | ICD-10-CM

## 2023-02-15 MED ORDER — METFORMIN HCL ER 500 MG PO TB24: 500.0000 mg | ORAL_TABLET | Freq: Two times a day (BID) | ORAL | 0 refills | Status: DC

## 2023-02-15 NOTE — Assessment & Plan Note (Signed)
Well controlled, continue current regimen. Refills provided. PDMP reviewed. Follow-up in 3 months.

## 2023-02-15 NOTE — Progress Notes (Signed)
Subjective:  HPI: Nicole [name] is a 42 y.o. female presenting on 02/15/2023 for Follow-up   HPI Patient is in today for follow-up for ADHD and diabetes. She is currently taking Metformin 500mg  daily inconsistently. She could not tolerate Trulicity.  DIABETES Hypoglycemic episodes:no Polydipsia/polyuria: no Visual disturbance: no Chest pain: no Paresthesias: no Glucose Monitoring: yes  Accucheck frequency: Daily  Fasting glucose: 160-180  Post prandial:  Evening:  Before meals: Taking Insulin?: no  Long acting insulin:  Short acting insulin: Blood Pressure Monitoring: not checking Retinal Examination: Not up to Date scheduled for August Foot Exam: Not up to Date Diabetic Education: Completed Pneumovax: Not up to Date Influenza: Up to Date Aspirin: no  ADHD FOLLOW UP ADHD status: controlled Satisfied with current therapy: yes Medication compliance:  excellent compliance Controlled substance contract: yes Previous psychiatry evaluation: yes Previous medications: yes adderall XR  anafranill, guanfacine Taking meds on weekends/vacations: yes Work/school performance:  good Difficulty sustaining attention/completing tasks: yes Distracted by extraneous stimuli: yes Does not listen when spoken to: yes  Fidgets with hands or feet: yes Unable to stay in seat: yes Blurts out/interrupts others: yes ADHD Medication Side Effects: no    Decreased appetite: no    Headache: no    Sleeping disturbance pattern: no    Irritability: no    Rebound effects (worse than baseline) off medication: no    Anxiousness: no    Dizziness: no    Tics: no    Review of Systems  All other systems reviewed and are negative.   Relevant past medical history reviewed and updated as indicated.   Past Medical History:  Diagnosis Date   Anxiety    Back pain    Depression    Diabetes mellitus without complication (HCC)    GERD (gastroesophageal reflux disease)    Hypothyroidism     Palpitations    Pre-diabetes    Vitamin D deficiency      Past Surgical History:  Procedure Laterality Date   MOUTH SURGERY      Allergies and medications reviewed and updated.   Current Outpatient Medications:    amphetamine-dextroamphetamine (ADDERALL XR) 15 MG 24 hr capsule, Take 1 capsule by mouth every morning., Disp: 30 capsule, Rfl: 0   amphetamine-dextroamphetamine (ADDERALL) 10 MG tablet, Take 1 tablet (10 mg total) by mouth daily as needed (uncontrolled ADHD)., Disp: 30 tablet, Rfl: 0   Cholecalciferol (VITAMIN D) 125 MCG (5000 UT) CAPS, Take 1 capsule by mouth daily., Disp: 30 capsule, Rfl: 0   clomiPRAMINE (ANAFRANIL) 25 MG capsule, Take 1 capsule (25 mg total) by mouth daily. (Patient taking differently: Take 50 mg by mouth daily.), Disp: 90 capsule, Rfl: 3   esomeprazole (NEXIUM) 40 MG capsule, Take 1 capsule (40 mg total) by mouth daily before breakfast., Disp: 90 capsule, Rfl: 3   famotidine (PEPCID) 10 MG tablet, Take 10 mg by mouth at bedtime., Disp: , Rfl:    glucose blood (ACCU-CHEK GUIDE) test strip, Use as instructed, Disp: 100 each, Rfl: 12   GuanFACINE HCl 3 MG TB24, TAKE 1 TABLET BY MOUTH EVERY EVENING., Disp: 90 tablet, Rfl: 1   hydrOXYzine (ATARAX) 10 MG tablet, Take 10 mg by mouth 3 (three) times daily as needed., Disp: , Rfl:    ibuprofen (ADVIL) 200 MG tablet, Take 200 mg by mouth every 6 (six) hours as needed., Disp: , Rfl:    levothyroxine (SYNTHROID) 50 MCG tablet, Take 50 mcg by mouth daily before breakfast., Disp: , Rfl:  magnesium 30 MG tablet, Take 30 mg by mouth 2 (two) times daily., Disp: , Rfl:    metFORMIN (GLUCOPHAGE-XR) 500 MG 24 hr tablet, Take 1 tablet (500 mg total) by mouth daily with breakfast., Disp: 90 tablet, Rfl: 0   Multiple Vitamin (MULTIVITAMIN) tablet, Take 1 tablet by mouth daily., Disp: , Rfl:    TRULICITY 0.75 MG/0.5ML SOPN, Inject into the skin. (Patient not taking: Reported on 02/15/2023), Disp: , Rfl:   Allergies  Allergen  Reactions   Bupropion Tinitus    Objective:   BP 124/72   Pulse 93   Temp (!) 97.5 F (36.4 C)   Ht 5\' 5"  (1.651 m)   Wt 208 lb (94.3 kg)   SpO2 99%   BMI 34.61 kg/m      02/15/2023    9:17 AM 01/06/2023    9:11 AM 10/28/2022    2:16 PM  Vitals with BMI  Height 5\' 5"  5\' 5"  5\' 5"   Weight 208 lbs 208 lbs 3 oz 208 lbs  BMI 34.61 34.65 34.61  Systolic 124 119 161  Diastolic 72 81 86  Pulse 93 85 93     Physical Exam Vitals and nursing note reviewed.  Constitutional:      Appearance: Normal appearance. She is normal weight.  HENT:     Head: Normocephalic and atraumatic.  Skin:    General: Skin is warm and dry.  Neurological:     General: No focal deficit present.     Mental Status: She is alert and oriented to person, place, and time. Mental status is at baseline.  Psychiatric:        Mood and Affect: Mood normal.        Behavior: Behavior normal.        Thought Content: Thought content normal.        Judgment: Judgment normal.     Assessment & Plan:  Attention deficit hyperactivity disorder (ADHD), combined type Assessment & Plan: Well controlled, continue current regimen. Refills provided. PDMP reviewed. Follow-up in 3 months.   Type 2 diabetes mellitus with other specified complication, without long-term current use of insulin (HCC) Assessment & Plan: A1c 7.9%. Unable to tolerate Trulicity. Increase Metformin XR to 500mg  BID. Continue with diabetic nutritionist. Follow-up in 3 months.      Follow up plan: Return in about 3 months (around 05/18/2023) for chronic follow-up with labs 1 week prior.  Park Meo, FNP

## 2023-02-15 NOTE — Assessment & Plan Note (Signed)
A1c 7.9%. Unable to tolerate Trulicity. Increase Metformin XR to 500mg  BID. Continue with diabetic nutritionist. Follow-up in 3 months.

## 2023-03-04 ENCOUNTER — Encounter: Payer: Self-pay | Admitting: Family Medicine

## 2023-03-08 ENCOUNTER — Encounter (INDEPENDENT_AMBULATORY_CARE_PROVIDER_SITE_OTHER): Payer: Self-pay

## 2023-03-08 ENCOUNTER — Other Ambulatory Visit (INDEPENDENT_AMBULATORY_CARE_PROVIDER_SITE_OTHER): Payer: Self-pay | Admitting: Gastroenterology

## 2023-03-08 DIAGNOSIS — K219 Gastro-esophageal reflux disease without esophagitis: Secondary | ICD-10-CM

## 2023-03-09 ENCOUNTER — Other Ambulatory Visit (HOSPITAL_COMMUNITY)
Admission: RE | Admit: 2023-03-09 | Discharge: 2023-03-09 | Disposition: A | Discharge status: Home / Self Care | Payer: Medicaid Other | Source: Ambulatory Visit | Attending: Gastroenterology | Admitting: Gastroenterology

## 2023-03-09 DIAGNOSIS — K219 Gastro-esophageal reflux disease without esophagitis: Secondary | ICD-10-CM | POA: Diagnosis present

## 2023-03-09 LAB — PREGNANCY, URINE: Preg Test, Ur: NEGATIVE

## 2023-03-11 ENCOUNTER — Other Ambulatory Visit: Payer: Self-pay

## 2023-03-11 ENCOUNTER — Ambulatory Visit (HOSPITAL_BASED_OUTPATIENT_CLINIC_OR_DEPARTMENT_OTHER): Payer: Medicaid Other | Admitting: Certified Registered Nurse Anesthetist

## 2023-03-11 ENCOUNTER — Ambulatory Visit (HOSPITAL_COMMUNITY)
Admission: RE | Admit: 2023-03-11 | Discharge: 2023-03-11 | Disposition: A | Discharge status: Home / Self Care | Payer: Medicaid Other | Attending: Gastroenterology | Admitting: Gastroenterology

## 2023-03-11 ENCOUNTER — Other Ambulatory Visit: Payer: Self-pay | Admitting: Family Medicine

## 2023-03-11 ENCOUNTER — Encounter (HOSPITAL_COMMUNITY): Payer: Self-pay | Admitting: Gastroenterology

## 2023-03-11 ENCOUNTER — Encounter (HOSPITAL_COMMUNITY)
Admission: RE | Disposition: A | Discharge status: Home / Self Care | Payer: Self-pay | Source: Home / Self Care | Attending: Gastroenterology

## 2023-03-11 ENCOUNTER — Ambulatory Visit (HOSPITAL_COMMUNITY): Payer: Medicaid Other | Admitting: Certified Registered Nurse Anesthetist

## 2023-03-11 DIAGNOSIS — K2950 Unspecified chronic gastritis without bleeding: Secondary | ICD-10-CM | POA: Diagnosis not present

## 2023-03-11 DIAGNOSIS — K449 Diaphragmatic hernia without obstruction or gangrene: Secondary | ICD-10-CM | POA: Insufficient documentation

## 2023-03-11 DIAGNOSIS — K317 Polyp of stomach and duodenum: Secondary | ICD-10-CM | POA: Insufficient documentation

## 2023-03-11 DIAGNOSIS — E119 Type 2 diabetes mellitus without complications: Secondary | ICD-10-CM | POA: Insufficient documentation

## 2023-03-11 DIAGNOSIS — F32A Depression, unspecified: Secondary | ICD-10-CM | POA: Insufficient documentation

## 2023-03-11 DIAGNOSIS — F419 Anxiety disorder, unspecified: Secondary | ICD-10-CM | POA: Insufficient documentation

## 2023-03-11 DIAGNOSIS — Z7984 Long term (current) use of oral hypoglycemic drugs: Secondary | ICD-10-CM | POA: Diagnosis not present

## 2023-03-11 DIAGNOSIS — E039 Hypothyroidism, unspecified: Secondary | ICD-10-CM | POA: Diagnosis not present

## 2023-03-11 DIAGNOSIS — K219 Gastro-esophageal reflux disease without esophagitis: Secondary | ICD-10-CM | POA: Diagnosis present

## 2023-03-11 DIAGNOSIS — K2289 Other specified disease of esophagus: Secondary | ICD-10-CM | POA: Insufficient documentation

## 2023-03-11 DIAGNOSIS — K3189 Other diseases of stomach and duodenum: Secondary | ICD-10-CM | POA: Diagnosis not present

## 2023-03-11 HISTORY — PX: BIOPSY: SHX5522

## 2023-03-11 HISTORY — PX: ESOPHAGOGASTRODUODENOSCOPY (EGD) WITH PROPOFOL: SHX5813

## 2023-03-11 LAB — GLUCOSE, CAPILLARY: Glucose-Capillary: 193 mg/dL — ABNORMAL HIGH (ref 70–99)

## 2023-03-11 SURGERY — ESOPHAGOGASTRODUODENOSCOPY (EGD) WITH PROPOFOL
Anesthesia: General

## 2023-03-11 MED ORDER — LIDOCAINE HCL (CARDIAC) PF 100 MG/5ML IV SOSY
PREFILLED_SYRINGE | INTRAVENOUS | Status: DC | PRN
  Administered 2023-03-11: 50 mg via INTRAVENOUS

## 2023-03-11 MED ORDER — LACTATED RINGERS IV SOLN: INTRAVENOUS | Status: DC | PRN

## 2023-03-11 MED ORDER — LIDOCAINE HCL (PF) 2 % IJ SOLN
INTRAMUSCULAR | Status: AC
  Filled 2023-03-11: qty 5

## 2023-03-11 MED ORDER — PANTOPRAZOLE SODIUM 40 MG PO TBEC: 40.0000 mg | DELAYED_RELEASE_TABLET | Freq: Two times a day (BID) | ORAL | 1 refills | Status: DC

## 2023-03-11 MED ORDER — PROPOFOL 500 MG/50ML IV EMUL
INTRAVENOUS | Status: DC | PRN
  Administered 2023-03-11: 180 ug/kg/min via INTRAVENOUS

## 2023-03-11 MED ORDER — PROPOFOL 10 MG/ML IV BOLUS
INTRAVENOUS | Status: DC | PRN
  Administered 2023-03-11: 80 mg via INTRAVENOUS
  Administered 2023-03-11: 20 mg via INTRAVENOUS

## 2023-03-11 MED ORDER — ESOMEPRAZOLE MAGNESIUM 40 MG PO CPDR: 40.0000 mg | DELAYED_RELEASE_CAPSULE | Freq: Two times a day (BID) | ORAL | 1 refills | Status: DC

## 2023-03-11 MED ORDER — PROPOFOL 500 MG/50ML IV EMUL
INTRAVENOUS | Status: AC
  Filled 2023-03-11: qty 50

## 2023-03-11 NOTE — Anesthesia Preprocedure Evaluation (Signed)
Anesthesia Evaluation  Patient identified by MRN, date of birth, ID band Patient awake    Reviewed: Allergy & Precautions, H&P , NPO status , Patient's Chart, lab work & pertinent test results, reviewed documented beta blocker date and time   Airway Mallampati: II  TM Distance: >3 FB Neck ROM: full    Dental no notable dental hx.    Pulmonary neg pulmonary ROS   Pulmonary exam normal breath sounds clear to auscultation       Cardiovascular Exercise Tolerance: Good negative cardio ROS  Rhythm:regular Rate:Normal     Neuro/Psych  PSYCHIATRIC DISORDERS Anxiety Depression    negative neurological ROS  negative psych ROS   GI/Hepatic negative GI ROS, Neg liver ROS,GERD  ,,  Endo/Other  negative endocrine ROSdiabetesHypothyroidism    Renal/GU negative Renal ROS  negative genitourinary   Musculoskeletal   Abdominal   Peds  Hematology negative hematology ROS (+)   Anesthesia Other Findings   Reproductive/Obstetrics negative OB ROS                             Anesthesia Physical Anesthesia Plan  ASA: 2  Anesthesia Plan: General   Post-op Pain Management:    Induction:   PONV Risk Score and Plan: Propofol infusion  Airway Management Planned:   Additional Equipment:   Intra-op Plan:   Post-operative Plan:   Informed Consent: I have reviewed the patients History and Physical, chart, labs and discussed the procedure including the risks, benefits and alternatives for the proposed anesthesia with the patient or authorized representative who has indicated his/her understanding and acceptance.     Dental Advisory Given  Plan Discussed with: CRNA  Anesthesia Plan Comments:        Anesthesia Quick Evaluation

## 2023-03-11 NOTE — Discharge Instructions (Signed)
You are being discharged to home.  Resume your previous diet.  We are waiting for your pathology results.  Take Nexium (esomeprazole) 40 mg by mouth twice a day.  Stop Pepcid.

## 2023-03-11 NOTE — Op Note (Signed)
[facility] Patient Name: Nicole [name] Procedure Date: 03/11/2023 7:09 AM MRN: [id number] Date of Birth: Jan 30, 1981 Attending MD: Katrinka Blazing , , [id number] CSN: [id number] Age: 42 Admit Type: Outpatient Procedure:                Upper GI endoscopy Indications:              Gastro-esophageal reflux disease Providers:                Katrinka Blazing, Buel Ream. Thomasena Edis RN, RN,                            Zena Amos Referring MD:              Medicines:                Monitored Anesthesia Care Complications:            No immediate complications. Estimated Blood Loss:     Estimated blood loss: none. Procedure:                Pre-Anesthesia Assessment:                           - Prior to the procedure, a History and Physical                            was performed, and patient medications, allergies                            and sensitivities were reviewed. The patient's                            tolerance of previous anesthesia was reviewed.                           - The risks and benefits of the procedure and the                            sedation options and risks were discussed with the                            patient. All questions were answered and informed                            consent was obtained.                           - ASA Grade Assessment: II - A patient with mild                            systemic disease.                           After obtaining informed consent, the endoscope was                            passed under direct vision. Throughout the  procedure, the patient's blood pressure, pulse, and                            oxygen saturations were monitored continuously. The                            [id number]-[id number] ([id number]) scope was introduced through the                            mouth, and advanced to the second part of duodenum.                            The upper GI endoscopy was accomplished without                             difficulty. The patient tolerated the procedure                            well. Scope In: 7:37:11 AM Scope Out: 7:45:08 AM Total Procedure Duration: 0 hours 7 minutes 57 seconds  Findings:      The Z-line was irregular and was found 33 cm from the incisors. Biopsies       were taken with a cold forceps for histology.      A 2 cm hiatal hernia was present.      The gastroesophageal flap valve was visualized endoscopically and       classified as Hill Grade III (minimal fold, loose to endoscope, hiatal       hernia likely).      A few medium sessile fundic gland polyps were found in the gastric body.      A single 4 mm mucosal papule (nodule) with no bleeding was found in the       gastric antrum. The nodule was Paris classification IIa (superficial,       elevated). This was biopsied with a cold forceps for histology.      The examined duodenum was normal. Impression:               - Z-line irregular, 33 cm from the incisors.                            Biopsied.                           - 2 cm hiatal hernia.                           - Gastroesophageal flap valve classified as Hill                            Grade III (minimal fold, loose to endoscope, hiatal                            hernia likely).                           - A few fundic gland polyps.                           -  A single mucosal papule (nodule) found in the                            stomach. Biopsied.                           - Normal examined duodenum. Moderate Sedation:      Per Anesthesia Care Recommendation:           - Discharge patient to home (ambulatory).                           - Resume previous diet.                           - Await pathology results.                           - Use Nexium (esomeprazole) 40 mg PO BID.                           - Stop Pepcid. Procedure Code(s):        --- Professional ---                           [phone], Esophagogastroduodenoscopy, flexible,                             transoral; with biopsy, single or multiple Diagnosis Code(s):        --- Professional ---                           K22.89, Other specified disease of esophagus                           K44.9, Diaphragmatic hernia without obstruction or                            gangrene                           K31.7, Polyp of stomach and duodenum                           K31.89, Other diseases of stomach and duodenum                           K21.9, Gastro-esophageal reflux disease without                            esophagitis CPT copyright 2022 American Medical Association. All rights reserved. The codes documented in this report are preliminary and upon coder review may  be revised to meet current compliance requirements. Katrinka Blazing, MD Katrinka Blazing,  03/11/2023 8:00:37 AM This report has been signed electronically. Number of Addenda: 0

## 2023-03-11 NOTE — Transfer of Care (Signed)
Immediate Anesthesia Transfer of Care Note  Patient: Nicole [name]  Procedure(s) Performed: ESOPHAGOGASTRODUODENOSCOPY (EGD) WITH PROPOFOL BIOPSY  Patient Location: Endoscopy Unit  Anesthesia Type:General  Level of Consciousness: drowsy, patient cooperative, and responds to stimulation  Airway & Oxygen Therapy: Patient Spontanous Breathing  Post-op Assessment: Report given to RN, Post -op Vital signs reviewed and stable, Patient moving all extremities X 4, and Patient able to stick tongue midline  Post vital signs: Reviewed  Last Vitals:  Vitals Value Taken Time  BP 89/50   Temp 97.7   Pulse 73   Resp 14   SpO2 98     Last Pain:  Vitals:   03/11/23 0742  TempSrc:   PainSc: 6       Patients Stated Pain Goal: 5 (03/11/23 0655)  Complications: No notable events documented.

## 2023-03-11 NOTE — H&P (Signed)
Nicole [name] is an 42 y.o. female.   Chief Complaint: GERD HPI: Nicole [name] is a 42 y.o. female with PMH GERD, DM, depression, hypothyroidism, who presents for evaluation of GERD.   Patient reports that she is still presenting heartburn despite taking Nexium every day.  She also takes 2 pills of over-the-counter famotidine at night.  No dysphagia or odynophagia.  Past Medical History:  Diagnosis Date   Anxiety    Back pain    Depression    Diabetes mellitus without complication (HCC)    GERD (gastroesophageal reflux disease)    Hypothyroidism    Palpitations    Pre-diabetes    Vitamin D deficiency     Past Surgical History:  Procedure Laterality Date   MOUTH SURGERY      Family History  Problem Relation Age of Onset   Thyroid disease Mother    Depression Mother    Hypertension Father    Depression Father    GER disease Sister    Breast cancer Maternal Aunt    Hypertension Maternal Grandmother    Depression Maternal Grandmother    Depression Maternal Grandfather    Hypertension Paternal Grandmother    Anxiety disorder Paternal Grandmother    Macular degeneration Paternal Grandmother    Diabetes Paternal Grandfather    Social History:  reports that she has never smoked. She has never used smokeless tobacco. She reports that she does not drink alcohol and does not use drugs.  Allergies:  Allergies  Allergen Reactions   Wellbutrin [Bupropion] Tinitus    Medications Prior to Admission  Medication Sig Dispense Refill   amphetamine-dextroamphetamine (ADDERALL XR) 15 MG 24 hr capsule Take 1 capsule by mouth every morning. 30 capsule 0   amphetamine-dextroamphetamine (ADDERALL) 10 MG tablet Take 1 tablet (10 mg total) by mouth daily as needed (uncontrolled ADHD). 30 tablet 0   Barberry-Oreg Grape-Goldenseal (BERBERINE COMPLEX PO) Take 1 capsule by mouth 2 (two) times daily.     cetirizine (ZYRTEC) 10 MG tablet Take 10 mg by mouth at bedtime.     Cholecalciferol (VITAMIN  D) 125 MCG (5000 UT) CAPS Take 1 capsule by mouth daily. 30 capsule 0   CHOLINE BITARTRATE PO Take 250 mg by mouth daily.     clomiPRAMINE (ANAFRANIL) 25 MG capsule Take 1 capsule (25 mg total) by mouth daily. (Patient taking differently: Take 25 mg by mouth at bedtime.) 90 capsule 3   esomeprazole (NEXIUM) 20 MG capsule Take 20 mg by mouth every evening.     famotidine (PEPCID) 20 MG tablet Take 40 mg by mouth at bedtime.     fluticasone (FLONASE) 50 MCG/ACT nasal spray Place 1 spray into both nostrils daily.     GuanFACINE HCl 3 MG TB24 TAKE 1 TABLET BY MOUTH EVERY EVENING. 90 tablet 1   hydrOXYzine (ATARAX) 10 MG tablet Take 10 mg by mouth 3 (three) times daily as needed for anxiety.     ibuprofen (ADVIL) 200 MG tablet Take 600 mg by mouth every 8 (eight) hours as needed for moderate pain.     Inositol Niacinate 250 MG TABS Take 250 mg by mouth daily.     levothyroxine (SYNTHROID) 50 MCG tablet Take 50-100 mcg by mouth daily before breakfast. 100 mcg on Sun and Wed morning, 50 mcg all other days     magnesium gluconate (MAGONATE) 500 MG tablet Take 500 mg by mouth daily.     metFORMIN (GLUCOPHAGE-XR) 500 MG 24 hr tablet Take 1 tablet (500 mg total)  by mouth 2 (two) times daily with a meal. (Patient taking differently: Take 167 mg by mouth every evening. Currently taking 1/3 of a tablet) 90 tablet 0   Misc Natural Products (FOCUSED MIND PO) Take 2 capsules by mouth daily. True Focus     Multiple Vitamin (MULTIVITAMIN) tablet Take 1 tablet by mouth daily.     Omega-3 Fatty Acids (FISH OIL) 1000 MG CAPS Take 200 mg by mouth daily.     Probiotic Product (PROBIOTIC DAILY PO) Take 1 capsule by mouth daily. 25 Billion     glucose blood (ACCU-CHEK GUIDE) test strip Use as instructed 100 each 12    Results for orders placed or performed during the hospital encounter of 03/11/23 (from the past 48 hour(s))  Glucose, capillary     Status: Abnormal   Collection Time: 03/11/23  6:56 AM  Result Value Ref  Range   Glucose-Capillary 193 (H) 70 - 99 mg/dL    Comment: Glucose reference range applies only to samples taken after fasting for at least 8 hours.   No results found.  Review of Systems  All other systems reviewed and are negative.   Blood pressure 114/73, pulse 87, temperature 98.3 F (36.8 C), temperature source Oral, resp. rate 12, height 5\' 5"  (1.651 m), weight 91.6 kg, last menstrual period 02/14/2023, SpO2 98 %. Physical Exam  GENERAL: The patient is AO x3, in no acute distress. HEENT: Head is normocephalic and atraumatic. EOMI are intact. Mouth is well hydrated and without lesions. NECK: Supple. No masses LUNGS: Clear to auscultation. No presence of rhonchi/wheezing/rales. Adequate chest expansion HEART: RRR, normal s1 and s2. ABDOMEN: Soft, nontender, no guarding, no peritoneal signs, and nondistended. BS +. No masses. EXTREMITIES: Without any cyanosis, clubbing, rash, lesions or edema. NEUROLOGIC: AOx3, no focal motor deficit. SKIN: no jaundice, no rashes  Assessment/Plan Nicole [name] is a 42 y.o. female with PMH GERD, DM, depression, hypothyroidism, who presents for evaluation of GERD.  Will proceed with EGD.  Dolores Frame, MD 03/11/2023, 7:26 AM

## 2023-03-11 NOTE — Anesthesia Procedure Notes (Signed)
Procedure Name: General with mask airway Date/Time: 03/11/2023 7:34 AM  Performed by: Cy Blamer, CRNAPre-anesthesia Checklist: Patient identified, Emergency Drugs available, Suction available, Patient being monitored and Timeout performed Patient Re-evaluated:Patient Re-evaluated prior to induction Oxygen Delivery Method: Nasal cannula Induction Type: IV induction Placement Confirmation: positive ETCO2

## 2023-03-14 LAB — SURGICAL PATHOLOGY

## 2023-03-14 NOTE — Anesthesia Postprocedure Evaluation (Signed)
Anesthesia Post Note  Patient: Nicole [name]  Procedure(s) Performed: ESOPHAGOGASTRODUODENOSCOPY (EGD) WITH PROPOFOL BIOPSY  Patient location during evaluation: Phase II Anesthesia Type: General Level of consciousness: awake Pain management: pain level controlled Vital Signs Assessment: post-procedure vital signs reviewed and stable Respiratory status: spontaneous breathing and respiratory function stable Cardiovascular status: blood pressure returned to baseline and stable Postop Assessment: no headache and no apparent nausea or vomiting Anesthetic complications: no Comments: Late entry   No notable events documented.   Last Vitals:  Vitals:   03/11/23 0749 03/11/23 0752  BP: (!) 89/50 (!) 97/55  Pulse: 73   Resp: 16   Temp: 36.5 C   SpO2: 99%     Last Pain:  Vitals:   03/11/23 0749  TempSrc: Oral  PainSc:                  Windell Norfolk

## 2023-03-15 MED ORDER — AMPHETAMINE-DEXTROAMPHET ER 15 MG PO CP24: 15.0000 mg | ORAL_CAPSULE | ORAL | 0 refills | Status: DC

## 2023-03-15 MED ORDER — AMPHETAMINE-DEXTROAMPHETAMINE 10 MG PO TABS: 10.0000 mg | ORAL_TABLET | Freq: Every day | ORAL | 0 refills | Status: DC | PRN

## 2023-03-16 ENCOUNTER — Encounter: Payer: Medicaid Other | Attending: Family Medicine | Admitting: Registered"

## 2023-03-16 ENCOUNTER — Encounter: Payer: Self-pay | Admitting: Registered"

## 2023-03-16 DIAGNOSIS — E119 Type 2 diabetes mellitus without complications: Secondary | ICD-10-CM | POA: Diagnosis present

## 2023-03-16 NOTE — Progress Notes (Signed)
Diabetes Self-Management Education  Visit Type:  Follow-up  Appt. Start Time: 10:03 Appt. End Time: 10:45  03/16/2023  Nicole [name], identified by name and date of birth, is a 42 y.o. female with a diagnosis of Diabetes:  .   ASSESSMENT  States she is still working on having balance with lunch and dinner. States she is hooked on greek yogurt + blueberries + granola and will have daily for lunch. States she has to have it once a day. States she is trying to increase vegetable intake. States is trying to have protein with any snack she is eating; some up and downs.   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 Reports she was getting schedule with them when preparing for her trip. States while on the trip she noticed increased thirst one day.   Reports increase in A1c since previous visit, 7.4% increased to 7.9%.  States she feels hypoglycemia when BS are around 110-119 mg/dL. States she was having heart palpitations once and checked BS, reading was 300 mg/dL. States she was shocked and has not experienced it again since then.   States she walked a lot during missions trip and has been hiking once since her return. States she likes walking, but its hard for her to get up early and walk before its too hot. States she will tell her kids as a source of accountability. States it is possible to walk I her neighborhood in the evenings, but most evenings she is tired.   Last menstrual period 02/14/2023. There is no height or weight on file to calculate BMI.    Diabetes Self-Management Education - 03/16/23 1006       Complications   Last HgB A1C per patient/outside source 7.9 %    How often do you check your blood sugar? 3-4 times / week    Fasting Blood glucose range (mg/dL) 161-096;045-409    Postprandial Blood glucose range (mg/dL) >811     Number of hypoglycemic episodes per month 2    Can you tell when your blood sugar is low? Yes    What do you do if your blood sugar is low? cheese stick + PB or will eat meal    Number of hyperglycemic episodes ( >200mg /dL): Weekly    Can you tell when your blood sugar is high? Yes    What do you do if your blood sugar is high? drinks water    Have you had a dilated eye exam in the past 12 months? No   has upcoming appt 04/2023   Have you had a dental exam in the past 12 months? --   unsure   Are you checking your feet? No      Dietary Intake   Breakfast 2 slices of [employer] killer bread + honey drizzle + almond butter and butter + coffee (plain)    Lunch yogurt bowl-greek yogurt + granola + blueberries    Snack (afternoon) iced coffee with creamer    Dinner [place] club sandwich + baked chips + chocolate cake    Snack (evening) popcorn (with olive oil + salt) + beef stick    Beverage(s) coffee (2*8-10 oz; 16-20 oz), water (40 oz), sparkling water (12 oz); 68+ oz      Activity / Exercise   Activity / Exercise Type Light (walking / raking leaves)  How many days per week do you exercise? 0    How many minutes per day do you exercise? 0    Total minutes per week of exercise 0      Individualized Goals (developed by patient)   Nutrition General guidelines for healthy choices and portions discussed    Physical Activity Exercise 1-2 times per week;15 minutes per day    Medications take my medication as prescribed      Post-Education Assessment   Patient understands the diabetes disease and treatment process. Comprehends key points    Patient understands incorporating nutritional management into lifestyle. Comprehends key points    Patient undertands incorporating physical activity into lifestyle. Needs Review    Patient understands using medications safely. Comphrehends key points    Patient understands monitoring blood glucose, interpreting and using results Comprehends key points     Patient understands prevention, detection, and treatment of acute complications. Comprehends key points    Patient understands prevention, detection, and treatment of chronic complications. Needs Instruction    Patient understands how to develop strategies to address psychosocial issues. Needs Review    Patient understands how to develop strategies to promote health/change behavior. Needs Review      Outcomes   Program Status Not Completed      Subsequent Visit   Since your last visit have you continued or begun to take your medications as prescribed? No    Since your last visit have you had your blood pressure checked? Yes    Is your most recent blood pressure lower, unchanged, or higher since your last visit? Unchanged    Since your last visit have you experienced any weight changes? Loss    Weight Loss (lbs) 2    Since your last visit, are you checking your blood glucose at least once a day? No   traveled out of town and didn't check            Learning Objective:  Patient will have a greater understanding of diabetes self-management. Patient education plan is to attend individual and/or group sessions per assessed needs and concerns.   Plan:   Patient Instructions  - Aim to increase walking to at least 15-20 minutes, 2 days/week.   - Continue to take medications as prescribed.   - Continue to aim for 1/2 plate of vegetables with lunch and dinner.  Lunch can be salad with grilled chicken Dinner can be air-fried chicken + microwaveable vegetables + microwaveable rice   - Transition greek yogurt bowl to breakfast option.    Expected Outcomes:  Demonstrated interest in learning but significant barriers to change  Education material provided: none  If problems or questions, patient to contact team via:  Phone and Email  Future DSME appointment: - 4-6 wks

## 2023-03-16 NOTE — Patient Instructions (Signed)
-   Aim to increase walking to at least 15-20 minutes, 2 days/week.   - Continue to take medications as prescribed.   - Continue to aim for 1/2 plate of vegetables with lunch and dinner.  Lunch can be salad with grilled chicken Dinner can be air-fried chicken + microwaveable vegetables + microwaveable rice   - Transition greek yogurt bowl to breakfast option.

## 2023-03-18 ENCOUNTER — Encounter (HOSPITAL_COMMUNITY): Payer: Self-pay | Admitting: Gastroenterology

## 2023-03-24 ENCOUNTER — Telehealth (INDEPENDENT_AMBULATORY_CARE_PROVIDER_SITE_OTHER): Payer: Medicaid Other | Admitting: Family Medicine

## 2023-03-24 DIAGNOSIS — M778 Other enthesopathies, not elsewhere classified: Secondary | ICD-10-CM | POA: Diagnosis not present

## 2023-03-24 MED ORDER — MELOXICAM 7.5 MG PO TABS: 7.5000 mg | ORAL_TABLET | Freq: Every day | ORAL | 0 refills | Status: DC

## 2023-03-24 NOTE — Assessment & Plan Note (Addendum)
Symptoms consistent with tendinitis. Recommended ice, tendonitis splint and NSAIDs. If pain persists or is not relieved return to office. Discussed risks of NSAID use

## 2023-03-24 NOTE — Progress Notes (Signed)
Virtual Visit via Video note  I connected with Nicole [name] on 03/24/23 at 1432 by video and verified that I am speaking with the correct person using two identifiers. Nicole [name] is currently located at home and no one is currently with her during visit. The provider, Park Meo, FNP is located in their office at time of visit.  I discussed the limitations, risks, security and privacy concerns of performing an evaluation and management service by video and the availability of in person appointments. I also discussed with the patient that there may be a patient responsible charge related to this service. The patient expressed understanding and agreed to proceed.  Subjective: PCP: Park Meo, FNP  Chief Complaint  Patient presents with   Follow-up    bilateral tendonitis in elbows; reqeusting meloxicam. If difficulty connecting via MyChart, call [phone] - J[facility]\\\    Paitient reports tendinitis flare in her left elbow in June and since travel and carrying heavy luggage both elbows are bothering her. The pain is in her elbow and radiates to her medial and lateral forearm. She denies numbness and tingling or injury. She has tendinitis in her elbow in her early 8s. She reports normal ROM. Pain is worse in her lateral forearm on the left with wrist flexion. Her strength is not affected. She has tried ice and Meloxicam with improvement. Aleve does not provide relief.     ROS: Per HPI  Current Outpatient Medications:    amphetamine-dextroamphetamine (ADDERALL XR) 15 MG 24 hr capsule, Take 1 capsule by mouth every morning., Disp: 30 capsule, Rfl: 0   amphetamine-dextroamphetamine (ADDERALL) 10 MG tablet, Take 1 tablet (10 mg total) by mouth daily as needed (uncontrolled ADHD)., Disp: 30 tablet, Rfl: 0   Barberry-Oreg Grape-Goldenseal (BERBERINE COMPLEX PO), Take 1 capsule by mouth 2 (two) times daily., Disp: , Rfl:    cetirizine (ZYRTEC) 10 MG tablet, Take 10 mg by mouth at  bedtime., Disp: , Rfl:    Cholecalciferol (VITAMIN D) 125 MCG (5000 UT) CAPS, Take 1 capsule by mouth daily., Disp: 30 capsule, Rfl: 0   CHOLINE BITARTRATE PO, Take 250 mg by mouth daily., Disp: , Rfl:    clomiPRAMINE (ANAFRANIL) 25 MG capsule, Take 1 capsule (25 mg total) by mouth daily. (Patient taking differently: Take 25 mg by mouth at bedtime.), Disp: 90 capsule, Rfl: 3   fluticasone (FLONASE) 50 MCG/ACT nasal spray, Place 1 spray into both nostrils daily., Disp: , Rfl:    glucose blood (ACCU-CHEK GUIDE) test strip, Use as instructed, Disp: 100 each, Rfl: 12   GuanFACINE HCl 3 MG TB24, TAKE 1 TABLET BY MOUTH EVERY EVENING., Disp: 90 tablet, Rfl: 1   hydrOXYzine (ATARAX) 10 MG tablet, Take 10 mg by mouth 3 (three) times daily as needed for anxiety., Disp: , Rfl:    ibuprofen (ADVIL) 200 MG tablet, Take 600 mg by mouth every 8 (eight) hours as needed for moderate pain., Disp: , Rfl:    Inositol Niacinate 250 MG TABS, Take 250 mg by mouth daily., Disp: , Rfl:    levothyroxine (SYNTHROID) 50 MCG tablet, Take 50-100 mcg by mouth daily before breakfast. 100 mcg on Sun and Wed morning, 50 mcg all other days, Disp: , Rfl:    magnesium gluconate (MAGONATE) 500 MG tablet, Take 500 mg by mouth daily., Disp: , Rfl:    meloxicam (MOBIC) 7.5 MG tablet, Take 1 tablet (7.5 mg total) by mouth daily., Disp: 30 tablet, Rfl: 0   metFORMIN (GLUCOPHAGE-XR) 500  MG 24 hr tablet, Take 1 tablet (500 mg total) by mouth 2 (two) times daily with a meal. (Patient taking differently: Take 167 mg by mouth every evening. Currently taking 1/3 of a tablet), Disp: 90 tablet, Rfl: 0   Misc Natural Products (FOCUSED MIND PO), Take 2 capsules by mouth daily. True Focus, Disp: , Rfl:    Multiple Vitamin (MULTIVITAMIN) tablet, Take 1 tablet by mouth daily., Disp: , Rfl:    Omega-3 Fatty Acids (FISH OIL) 1000 MG CAPS, Take 200 mg by mouth daily., Disp: , Rfl:    pantoprazole (PROTONIX) 40 MG tablet, Take 1 tablet (40 mg total) by mouth  2 (two) times daily., Disp: 180 tablet, Rfl: 1   Probiotic Product (PROBIOTIC DAILY PO), Take 1 capsule by mouth daily. 25 Billion, Disp: , Rfl:   Observations/Objective: Physical Exam Constitutional:      General: She is not in acute distress.    Appearance: Normal appearance. She is not toxic-appearing.  Eyes:     Conjunctiva/sclera: Conjunctivae normal.  Pulmonary:     Effort: Pulmonary effort is normal. No respiratory distress.  Skin:    Coloration: Skin is not pale.  Neurological:     General: No focal deficit present.     Mental Status: She is alert and oriented to person, place, and time.  Psychiatric:        Mood and Affect: Mood normal.        Behavior: Behavior normal.        Thought Content: Thought content normal.        Judgment: Judgment normal.    Assessment and Plan: Elbow tendinitis Assessment & Plan: Symptoms consistent with tendinitis. Recommended ice, tendonitis splint and NSAIDs. If pain persists or is not relieved return to office. Discussed risks of NSAID use   Other orders -     Meloxicam; Take 1 tablet (7.5 mg total) by mouth daily.  Dispense: 30 tablet; Refill: 0    Follow Up Instructions: No follow-ups on file.   I discussed the assessment and treatment plan with the patient. The patient was provided an opportunity to ask questions and all were answered. The patient agreed with the plan and demonstrated an understanding of the instructions.   The patient was advised to call back or seek an in-person evaluation if the symptoms worsen or if the condition fails to improve as anticipated.  The above assessment and management plan was discussed with the patient. The patient verbalized understanding of and has agreed to the management plan. Patient is aware to call the clinic if symptoms persist or worsen. Patient is aware when to return to the clinic for a follow-up visit. Patient educated on when it is appropriate to go to the emergency department.    Time call ended: 1443  I provided 11 minutes of face-to-face time during this encounter.   Kurtis Bushman, MSN, APRN, FNP-C [facility]

## 2023-04-08 IMAGING — MG MM DIGITAL SCREENING BILAT W/ TOMO AND CAD
6 of 10 series · 6 of 30 positions shown · non-contrast
Comparison: None.

CLINICAL DATA: Screening.

EXAM:
DIGITAL SCREENING BILATERAL MAMMOGRAM WITH TOMOSYNTHESIS AND CAD
TECHNIQUE: Bilateral screening digital craniocaudal and mediolateral oblique
mammograms were obtained. Bilateral screening digital breast
tomosynthesis was performed. The images were evaluated with
computer-aided detection.

[L MLO synth-2D (1 of 2)]
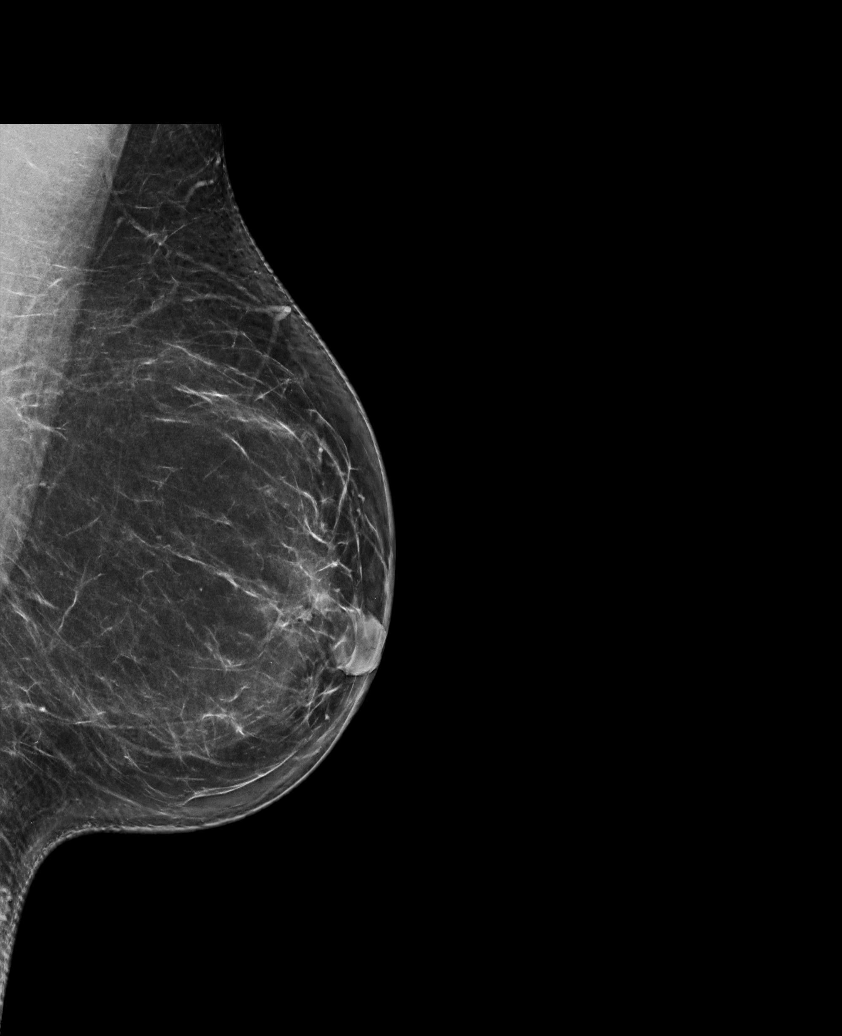

[R MLO synth-2D]
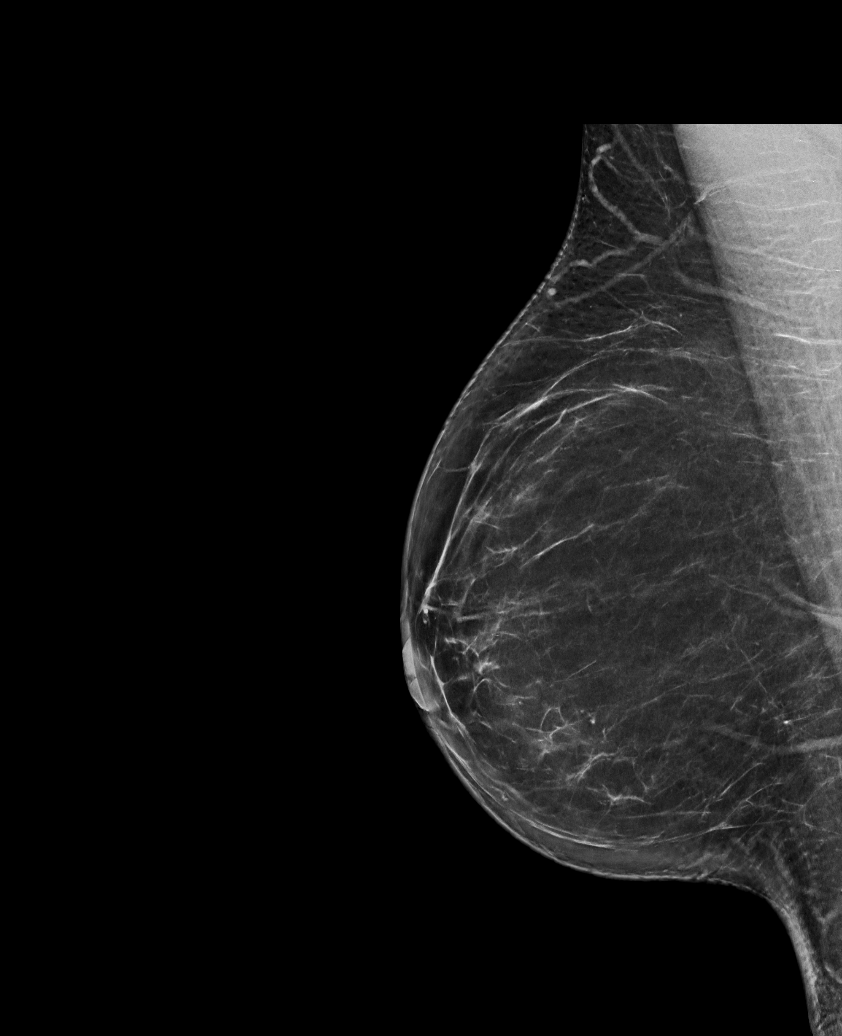

[L MLO synth-2D (2 of 2)]
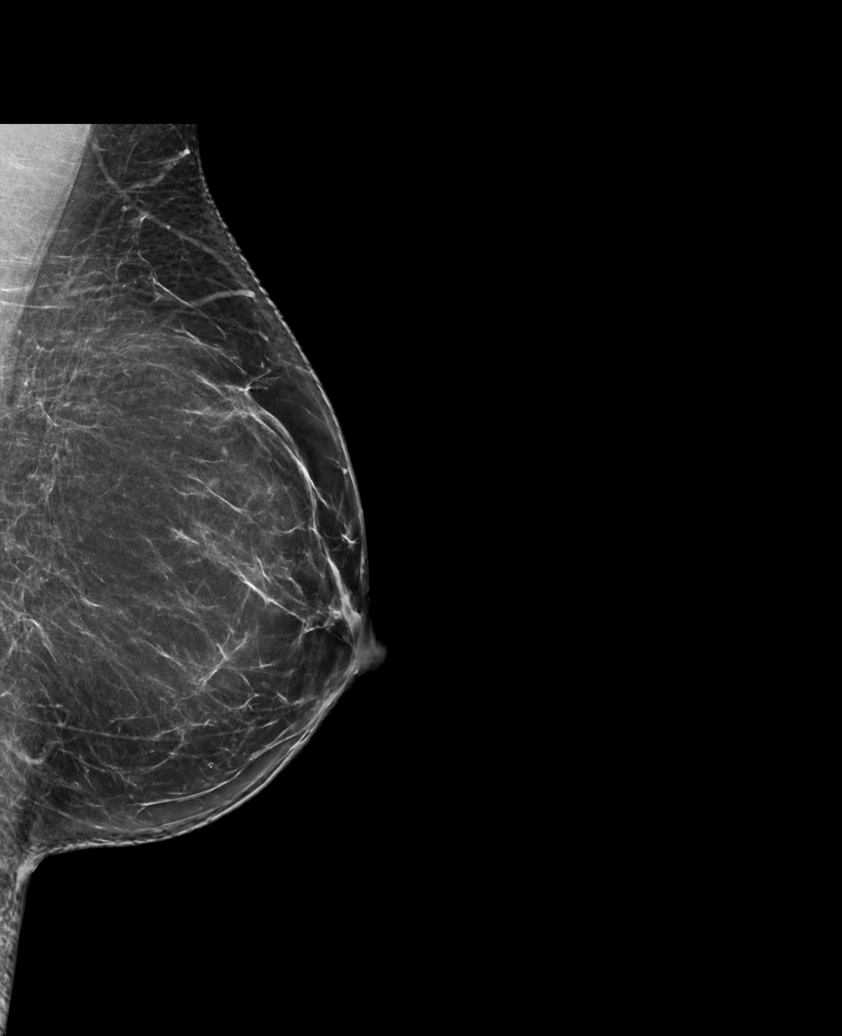

[L CC synth-2D]
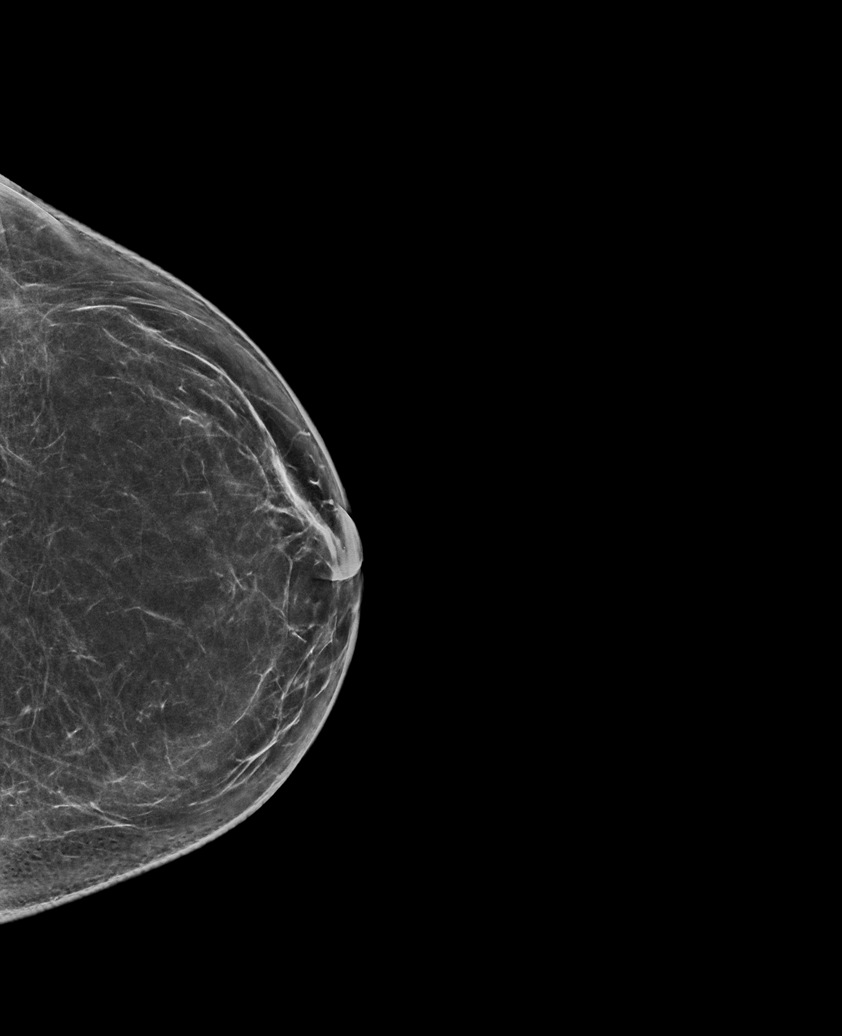

[R CC synth-2D]
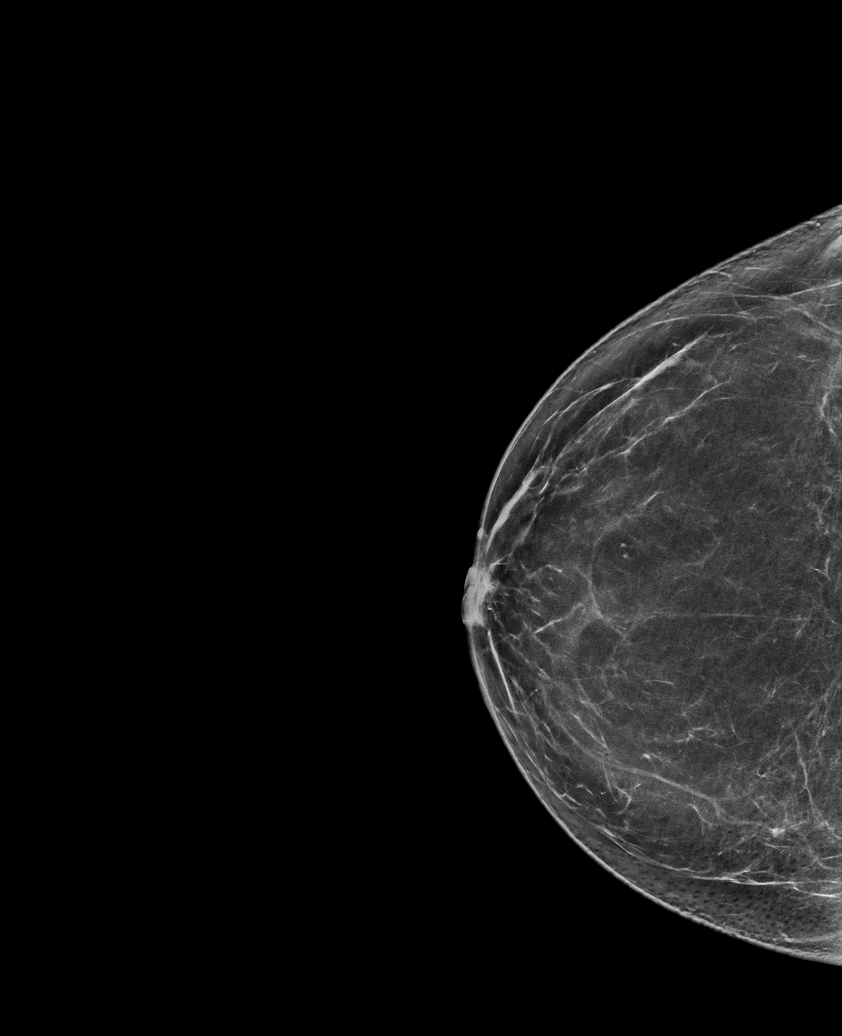

[R CC tomo · tomo slice 38/75.0]
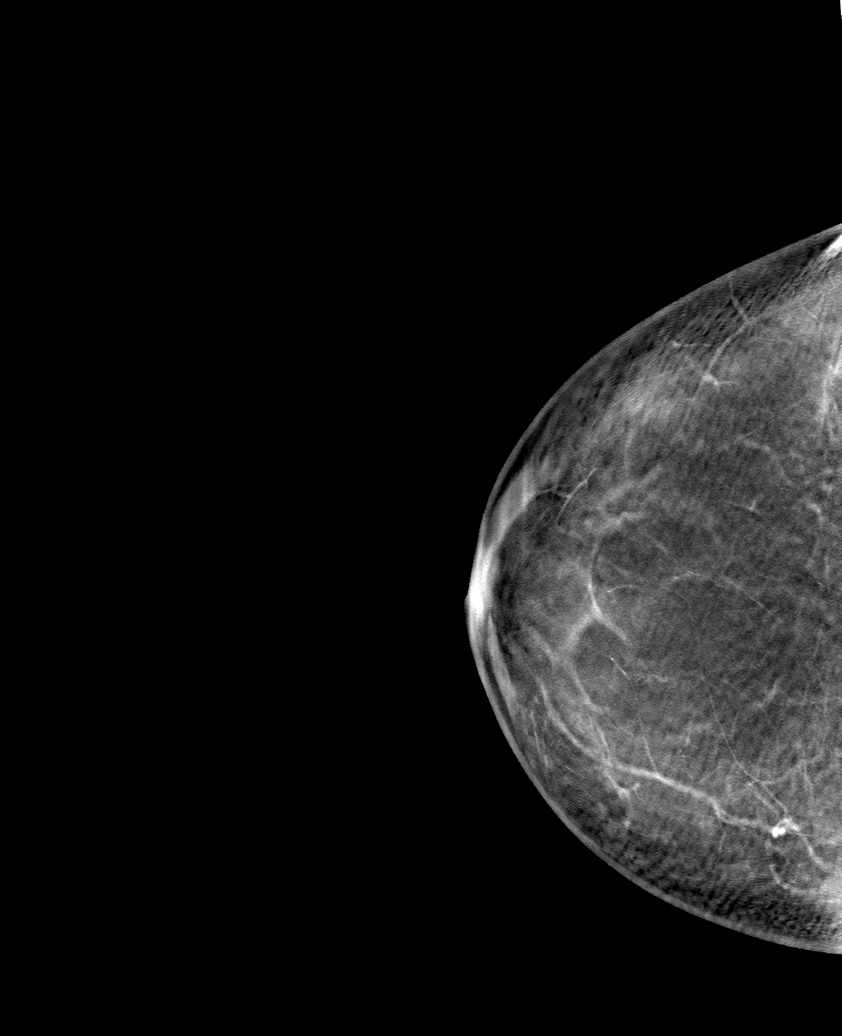

[6 of 30 positions shown; findings below may reference images not displayed]

ACR Breast Density Category b: There are scattered areas of
fibroglandular density.
FINDINGS: There are no findings suspicious for malignancy.
IMPRESSION: No mammographic evidence of malignancy. A result letter of this
screening mammogram will be mailed directly to the patient.

RECOMMENDATION:
Screening mammogram in one year. (Code:XG-X-X7B)

BI-RADS CATEGORY  1: Negative.

## 2023-04-19 ENCOUNTER — Other Ambulatory Visit: Payer: Self-pay | Admitting: Family Medicine

## 2023-04-21 ENCOUNTER — Other Ambulatory Visit: Payer: Self-pay | Admitting: Family Medicine

## 2023-04-21 DIAGNOSIS — G5603 Carpal tunnel syndrome, bilateral upper limbs: Secondary | ICD-10-CM | POA: Insufficient documentation

## 2023-04-21 DIAGNOSIS — M25521 Pain in right elbow: Secondary | ICD-10-CM | POA: Insufficient documentation

## 2023-04-26 ENCOUNTER — Encounter: Payer: Self-pay | Admitting: Family Medicine

## 2023-04-26 ENCOUNTER — Ambulatory Visit (INDEPENDENT_AMBULATORY_CARE_PROVIDER_SITE_OTHER): Payer: Medicaid Other | Admitting: Gastroenterology

## 2023-04-26 ENCOUNTER — Encounter (INDEPENDENT_AMBULATORY_CARE_PROVIDER_SITE_OTHER): Payer: Self-pay | Admitting: Gastroenterology

## 2023-04-26 ENCOUNTER — Other Ambulatory Visit: Payer: Self-pay | Admitting: Family Medicine

## 2023-04-26 VITALS — BP 129/85 | HR 92 | Temp 98.6°F | Ht 65.0 in | Wt 206.8 lb

## 2023-04-26 DIAGNOSIS — K219 Gastro-esophageal reflux disease without esophagitis: Secondary | ICD-10-CM | POA: Diagnosis not present

## 2023-04-26 MED ORDER — AMPHETAMINE-DEXTROAMPHETAMINE 10 MG PO TABS: 10.0000 mg | ORAL_TABLET | Freq: Every day | ORAL | 0 refills | Status: DC | PRN

## 2023-04-26 MED ORDER — AMPHETAMINE-DEXTROAMPHET ER 15 MG PO CP24: 15.0000 mg | ORAL_CAPSULE | ORAL | 0 refills | Status: DC

## 2023-04-26 MED ORDER — MELOXICAM 7.5 MG PO TABS: 7.5000 mg | ORAL_TABLET | Freq: Every day | ORAL | 0 refills | Status: DC

## 2023-04-26 MED ORDER — METFORMIN HCL ER 500 MG PO TB24: 500.0000 mg | ORAL_TABLET | Freq: Two times a day (BID) | ORAL | 0 refills | Status: DC

## 2023-04-26 NOTE — Progress Notes (Signed)
Referring Provider: Park Meo, FNP Primary Care Physician:  Park Meo, FNP Primary GI Physician: Dr. Levon Hedger   Chief Complaint  Patient presents with   Follow-up    Patient here today for a follow up appointment on her Genella Rife. She is taking Pantoprazole 40 mg bid, which does control here symptoms.    HPI:   Nicole [name] is a 42 y.o. female with past medical history of GERD, DM, depression, hypothyroidism   Patient presenting today for follow up of GERD  Last seen April 2024, at that time having recurrent heartburn, taking otc esomeprazole and famotidine which helped some   Recommended to increased esomeprazole to 40mg  daily, schedule EGD, continue famotidine at bedtime  Switched to pantoprazole 40mg  BID after EGD  Present:  GERD fairly well controlled on protonix 40mg  BID, occasionally needing famotidine 20mg  at bedtime, she does this maybe twice a week. She notes sometimes she does eat closer to bedtime but usually something light like popcorn, otherwise, typically eating dinner around 6pm. Brett Albino tends to cause her the worst issues. She does elevate HOB at night which helps some. Feels that she has some heartburn at times depending on what she is eating. No red flag symptoms. Patient denies melena, hematochezia, nausea, vomiting, diarrhea, constipation, dysphagia, odyonophagia, early satiety or weight loss.   She has read about TIF procedure, may be interested in the future but does not want to undergo this right now.   Last Colonoscopy: never Last Endoscopy:02/2023 - Z-line irregular, 33 cm from the incisors.                            Biopsied.                           - 2 cm hiatal hernia.                           - Gastroesophageal flap valve classified as Hill                            Grade III (minimal fold, loose to endoscope, hiatal                            hernia likely).                           - A few fundic gland polyps.                           -  A single mucosal papule (nodule) found in the                            stomach. Biopsied.                       - Normal examined duodenum.  Path: focal intestinal metaplasia in the stomach repeat EGD in 3 years for gastric mapping    Past Medical History:  Diagnosis Date   Anxiety    Back pain    Depression    Diabetes mellitus without complication (HCC)    GERD (gastroesophageal reflux disease)    Hypothyroidism  Palpitations    Pre-diabetes    Vitamin D deficiency     Past Surgical History:  Procedure Laterality Date   BIOPSY  03/11/2023   Procedure: BIOPSY;  Surgeon: Marguerita Merles, Reuel Boom, MD;  [facility]: [facility];  Service: Gastroenterology;;   ESOPHAGOGASTRODUODENOSCOPY (EGD) WITH PROPOFOL N/A 03/11/2023   Procedure: ESOPHAGOGASTRODUODENOSCOPY (EGD) WITH PROPOFOL;  Surgeon: Dolores Frame, MD;  Location: [facility];  Service: Gastroenterology;  Laterality: N/A;  7:30AM;ASA 1-2   MOUTH SURGERY      Current Outpatient Medications  Medication Sig Dispense Refill   amphetamine-dextroamphetamine (ADDERALL XR) 15 MG 24 hr capsule Take 1 capsule by mouth every morning. 30 capsule 0   amphetamine-dextroamphetamine (ADDERALL) 10 MG tablet Take 1 tablet (10 mg total) by mouth daily as needed (uncontrolled ADHD). 30 tablet 0   Barberry-Oreg Grape-Goldenseal (BERBERINE COMPLEX PO) Take 1 capsule by mouth 2 (two) times daily.     cetirizine (ZYRTEC) 10 MG tablet Take 10 mg by mouth at bedtime.     Cholecalciferol (VITAMIN D) 125 MCG (5000 UT) CAPS Take 1 capsule by mouth daily. 30 capsule 0   CHOLINE BITARTRATE PO Take 250 mg by mouth daily.     clomiPRAMINE (ANAFRANIL) 25 MG capsule Take 1 capsule (25 mg total) by mouth daily. (Patient taking differently: Take 25 mg by mouth at bedtime.) 90 capsule 3   glucose blood (ACCU-CHEK GUIDE) test strip Use as instructed (Patient taking differently: 1 each by Other route daily at 6 (six) AM. Use as instructed) 100 each  12   GuanFACINE HCl 3 MG TB24 TAKE 1 TABLET BY MOUTH EVERY EVENING. 90 tablet 1   hydrOXYzine (ATARAX) 10 MG tablet Take 10 mg by mouth 3 (three) times daily as needed for anxiety.     ibuprofen (ADVIL) 200 MG tablet Take 600 mg by mouth every 8 (eight) hours as needed for moderate pain.     Inositol Niacinate 250 MG TABS Take 250 mg by mouth daily.     levothyroxine (SYNTHROID) 50 MCG tablet Take 50-100 mcg by mouth daily before breakfast. 100 mcg on Sun and Wed morning, 50 mcg all other days     magnesium gluconate (MAGONATE) 500 MG tablet Take 500 mg by mouth daily.     metFORMIN (GLUCOPHAGE-XR) 500 MG 24 hr tablet Take 1 tablet (500 mg total) by mouth 2 (two) times daily with a meal. (Patient taking differently: Take 167 mg by mouth every evening. Currently taking 1/3 of a tablet) 90 tablet 0   Misc Natural Products (FOCUSED MIND PO) Take 2 capsules by mouth daily. True Focus     Multiple Vitamin (MULTIVITAMIN) tablet Take 1 tablet by mouth daily.     Omega-3 Fatty Acids (FISH OIL) 1000 MG CAPS Take 200 mg by mouth daily.     pantoprazole (PROTONIX) 40 MG tablet Take 1 tablet (40 mg total) by mouth 2 (two) times daily. 180 tablet 1   Probiotic Product (PROBIOTIC DAILY PO) Take 1 capsule by mouth daily. 25 Billion     fluticasone (FLONASE) 50 MCG/ACT nasal spray Place 1 spray into both nostrils daily. (Patient not taking: Reported on 04/26/2023)     meloxicam (MOBIC) 7.5 MG tablet Take 1 tablet (7.5 mg total) by mouth daily. (Patient not taking: Reported on 04/26/2023) 30 tablet 0   No current facility-administered medications for this visit.    Allergies as of 04/26/2023 - Review Complete 04/26/2023  Allergen Reaction Noted   Wellbutrin [bupropion] Tinitus 10/25/2019  Family History  Problem Relation Age of Onset   Thyroid disease Mother    Depression Mother    Hypertension Father    Depression Father    GER disease Sister    Breast cancer Maternal Aunt    Hypertension Maternal  Grandmother    Depression Maternal Grandmother    Depression Maternal Grandfather    Hypertension Paternal Grandmother    Anxiety disorder Paternal Grandmother    Macular degeneration Paternal Grandmother    Diabetes Paternal Grandfather     Social History   Socioeconomic History   Marital status: Married    Spouse name: Not on file   Number of children: 3   Years of education: Not on file   Highest education level: Not on file  Occupational History   Occupation: stay at home mom  Tobacco Use   Smoking status: Never   Smokeless tobacco: Never  Vaping Use   Vaping status: Never Used  Substance and Sexual Activity   Alcohol use: No   Drug use: No   Sexual activity: Yes    Birth control/protection: Surgical    Comment: vasectomy  Other Topics Concern   Not on file  Social History Narrative   Not on file   Social Determinants of Health   Financial Resource Strain: Low Risk  (12/21/2021)   Overall Financial Resource Strain (CARDIA)    Difficulty of Paying Living Expenses: Not hard at all  Food Insecurity: No Food Insecurity (12/21/2021)   Hunger Vital Sign    Worried About Running Out of Food in the Last Year: Never true    Ran Out of Food in the Last Year: Never true  Transportation Needs: No Transportation Needs (12/21/2021)   PRAPARE - Administrator, Civil Service (Medical): No    Lack of Transportation (Non-Medical): No  Physical Activity: Insufficiently Active (12/21/2021)   Exercise Vital Sign    Days of Exercise per Week: 2 days    Minutes of Exercise per Session: 20 min  Stress: No Stress Concern Present (12/21/2021)   [employer] of Occupational Health - Occupational Stress Questionnaire    Feeling of Stress : Only a little  Social Connections: Socially Integrated (12/21/2021)   Social Connection and Isolation Panel [NHANES]    Frequency of Communication with Friends and Family: Once a week    Frequency of Social Gatherings with Friends and  Family: Twice a week    Attends Religious Services: More than 4 times per year    Active Member of [employer] or Organizations: Yes    Attends Engineer, structural: More than 4 times per year    Marital Status: Married   Review of systems General: negative for malaise, night sweats, fever, chills, weight loss Neck: Negative for lumps, goiter, pain and significant neck swelling Resp: Negative for cough, wheezing, dyspnea at rest CV: Negative for chest pain, leg swelling, palpitations, orthopnea GI: denies melena, hematochezia, nausea, vomiting, diarrhea, constipation, dysphagia, odyonophagia, early satiety or unintentional weight loss. +GERD symptoms  MSK: Negative for joint pain or swelling, back pain, and muscle pain. Derm: Negative for itching or rash Psych: Denies depression, anxiety, memory loss, confusion. No homicidal or suicidal ideation.  Heme: Negative for prolonged bleeding, bruising easily, and swollen nodes. Endocrine: Negative for cold or heat intolerance, polyuria, polydipsia and goiter. Neuro: negative for tremor, gait imbalance, syncope and seizures. The remainder of the review of systems is noncontributory.  Physical Exam: BP 129/85 (BP Location: Left Arm, Patient Position: Sitting,  Cuff Size: Large)   Pulse 92   Temp 98.6 F (37 C) (Temporal)   Ht 5\' 5"  (1.651 m)   Wt 206 lb 12.8 oz (93.8 kg)   BMI 34.41 kg/m  General:   Alert and oriented. No distress noted. Pleasant and cooperative.  Head:  Normocephalic and atraumatic. Eyes:  Conjuctiva clear without scleral icterus. Mouth:  Oral mucosa pink and moist. Good dentition. No lesions. Heart: Normal rate and rhythm, s1 and s2 heart sounds present.  Lungs: Clear lung sounds in all lobes. Respirations equal and unlabored. Abdomen:  +BS, soft, non-tender and non-distended. No rebound or guarding. No HSM or masses noted. Derm: No palmar erythema or jaundice Msk:  Symmetrical without gross deformities. Normal  posture. Extremities:  Without edema. Neurologic:  Alert and  oriented x4 Psych:  Alert and cooperative. Normal mood and affect.  Invalid input(s): "6 MONTHS"   ASSESSMENT: Jeff Chessor is a 42 y.o. female presenting today for follow up of GERD  GERD: Symptoms are fairly well-controlled on pantoprazole 40 mg twice daily.  She notes she is taking famotidine 20 mg as needed nightly maybe twice per week when she is having heartburn, the symptoms are usually related to something she has eaten such as pizza.  She does note occasionally snacking just prior to bed.  I encouraged her to be mindful of tomato-based, citrus, caffeine, chocolate, greasy, spicy, fried foods and try to stay upright 2 to 3 hours after eating prior to going to bed as well as avoiding eating heavier meals late in the evening.  Will continue with PPI twice daily and H2B as needed for now, she should let me know if she was requiring supplemental famotidine more frequently.  Will plan for repeat upper endoscopy with gastric mapping in 2027.  She has read over to procedure brochure that she was given previously and notes she may be interested but she does not wish to pursue this at this time.  I encouraged her to reach out to [place] if she has any further questions or if she decides she is interested in pursuing TIF.   PLAN:  Continue protonix 40mg  BID  2. Famotidine 20mg  at bedtime PRN  3. Good reflux precautions 4. Pt to reach out if interested in discussing TIF procedure further  All questions were answered, patient verbalized understanding and is in agreement with plan as outlined above.   Follow Up: 1 year   Laria Grimmett L. Jeanmarie Hubert, MSN, APRN, AGNP-C Adult-Gerontology Nurse Practitioner [facility]  I have reviewed the note and agree with the APP's assessment as described in this progress note  Katrinka Blazing, MD Gastroenterology and Hepatology [facility] Gastroenterology

## 2023-04-26 NOTE — Patient Instructions (Signed)
Please continue with pantoprazole 40mg  twice daily  Avoid greasy, spicy, fried, citrus foods, and be mindful that caffeine, carbonated drinks, chocolate and alcohol can increase reflux symptoms, make sure to Stay upright 2-3 hours after eating, prior to lying down and avoid eating late in the evenings. Elevating the head of your bed can also prevent acid regurgitation at night You can continue to use famotidine as needed in the evenings, if you are requiring this more frequently, please let me know If you decide you want to discuss TIF procedure further, please give [place] a call  Follow up 1 year

## 2023-05-11 ENCOUNTER — Other Ambulatory Visit: Payer: Medicaid Other

## 2023-05-11 ENCOUNTER — Ambulatory Visit: Payer: Medicaid Other | Admitting: Registered"

## 2023-05-11 DIAGNOSIS — E1169 Type 2 diabetes mellitus with other specified complication: Secondary | ICD-10-CM

## 2023-05-11 DIAGNOSIS — E038 Other specified hypothyroidism: Secondary | ICD-10-CM

## 2023-05-11 DIAGNOSIS — E7849 Other hyperlipidemia: Secondary | ICD-10-CM

## 2023-05-11 LAB — HM DIABETES EYE EXAM

## 2023-05-12 ENCOUNTER — Encounter: Payer: Self-pay | Admitting: Family Medicine

## 2023-05-13 LAB — CBC WITH DIFFERENTIAL/PLATELET
Absolute Monocytes: 739 {cells}/uL (ref 200–950)
Basophils Absolute: 50 {cells}/uL (ref 0–200)
Basophils Relative: 0.6 %
Eosinophils Absolute: 224 {cells}/uL (ref 15–500)
Eosinophils Relative: 2.7 %
HCT: 37.7 % (ref 35.0–45.0)
Hemoglobin: 12.7 g/dL (ref 11.7–15.5)
Lymphs Abs: 2747 cells/uL (ref 850–3900)
MCH: 30.9 pg (ref 27.0–33.0)
MCHC: 33.7 g/dL (ref 32.0–36.0)
MCV: 91.7 fL (ref 80.0–100.0)
MPV: 10.1 fL (ref 7.5–12.5)
Monocytes Relative: 8.9 %
Neutro Abs: 4540 {cells}/uL (ref 1500–7800)
Neutrophils Relative %: 54.7 %
Platelets: 366 10*3/uL (ref 140–400)
RBC: 4.11 10*6/uL (ref 3.80–5.10)
RDW: 11.9 % (ref 11.0–15.0)
Total Lymphocyte: 33.1 %
WBC: 8.3 10*3/uL (ref 3.8–10.8)

## 2023-05-13 LAB — COMPLETE METABOLIC PANEL WITH GFR
AG Ratio: 1.6 (calc) (ref 1.0–2.5)
ALT: 25 U/L (ref 6–29)
AST: 15 U/L (ref 10–30)
Albumin: 4.1 g/dL (ref 3.6–5.1)
Alkaline phosphatase (APISO): 71 U/L (ref 31–125)
BUN: 10 mg/dL (ref 7–25)
CO2: 25 mmol/L (ref 20–32)
Calcium: 9.4 mg/dL (ref 8.6–10.2)
Chloride: 101 mmol/L (ref 98–110)
Creat: 0.56 mg/dL (ref 0.50–0.99)
Globulin: 2.5 g/dL (calc) (ref 1.9–3.7)
Glucose, Bld: 174 mg/dL — ABNORMAL HIGH (ref 65–99)
Potassium: 4.3 mmol/L (ref 3.5–5.3)
Sodium: 136 mmol/L (ref 135–146)
Total Bilirubin: 0.4 mg/dL (ref 0.2–1.2)
Total Protein: 6.6 g/dL (ref 6.1–8.1)
eGFR: 116 mL/min/{1.73_m2} (ref >=60)

## 2023-05-13 LAB — HEMOGLOBIN A1C
Hgb A1c MFr Bld: 7.4 %{Hb} — ABNORMAL HIGH (ref <5.7)
Mean Plasma Glucose: 166 mg/dL
eAG (mmol/L): 9.2 mmol/L

## 2023-05-13 LAB — LIPID PANEL
Cholesterol: 154 mg/dL (ref <200)
HDL: 45 mg/dL — ABNORMAL LOW (ref >=50)
LDL Cholesterol (Calc): 81 mg/dL
Non-HDL Cholesterol (Calc): 109 mg/dL (ref <130)
Total CHOL/HDL Ratio: 3.4 (calc) (ref <5.0)
Triglycerides: 183 mg/dL — ABNORMAL HIGH (ref <150)

## 2023-05-13 LAB — TEST AUTHORIZATION

## 2023-05-13 LAB — TSH: TSH: 2.2 m[IU]/L

## 2023-05-18 ENCOUNTER — Telehealth (INDEPENDENT_AMBULATORY_CARE_PROVIDER_SITE_OTHER): Payer: Medicaid Other | Admitting: Family Medicine

## 2023-05-18 DIAGNOSIS — Z7984 Long term (current) use of oral hypoglycemic drugs: Secondary | ICD-10-CM | POA: Diagnosis not present

## 2023-05-18 DIAGNOSIS — E1169 Type 2 diabetes mellitus with other specified complication: Secondary | ICD-10-CM

## 2023-05-18 DIAGNOSIS — F902 Attention-deficit hyperactivity disorder, combined type: Secondary | ICD-10-CM | POA: Diagnosis not present

## 2023-05-18 MED ORDER — GLIPIZIDE ER 2.5 MG PO TB24: 2.5000 mg | ORAL_TABLET | Freq: Every day | ORAL | 1 refills | Status: DC

## 2023-05-18 NOTE — Progress Notes (Deleted)
Subjective:  HPI: Nicole [name] is a 42 y.o. female presenting on 05/18/2023 for No chief complaint on file.   HPI Patient is in today for follow-up for ADHD.  ADHD FOLLOW UP ADHD status: {Blank single:19197::"controlled","uncontrolled","better","worse","exacerbated","stable"} Satisfied with current therapy: {Blank single:19197::"yes","no"} Medication compliance:  {Blank single:19197::"excellent compliance","good compliance","fair compliance","poor compliance"} Controlled substance contract: {Blank single:19197::"yes","no"} Previous psychiatry evaluation: {Blank single:19197::"yes","no"} Previous medications: {Blank single:19197::"yes","no"} adderall XR   Taking meds on weekends/vacations: {Blank single:19197::"yes","no","occasionally"} Work/school performance:  {Blank single:19197::"excellent","good","average","fair","poor"} Difficulty sustaining attention/completing tasks: {Blank single:19197::"yes","no"} Distracted by extraneous stimuli: {Blank single:19197::"yes","no"} Does not listen when spoken to: {Blank single:19197::"yes","no"}  Fidgets with hands or feet: {Blank single:19197::"yes","no"} Unable to stay in seat: {Blank single:19197::"yes","no"} Blurts out/interrupts others: {Blank single:19197::"yes","no"} ADHD Medication Side Effects: {Blank single:19197::"yes","no"}    Decreased appetite: {Blank single:19197::"yes","no"}    Headache: {Blank single:19197::"yes","no"}    Sleeping disturbance pattern: {Blank single:19197::"yes","no"}    Irritability: {Blank single:19197::"yes","no"}    Rebound effects (worse than baseline) off medication: {Blank single:19197::"yes","no"}    Anxiousness: {Blank single:19197::"yes","no"}    Dizziness: {Blank single:19197::"yes","no"}    Tics: {Blank single:19197::"yes","no"}    Review of Systems  All other systems reviewed and are negative.   Relevant past medical history reviewed and updated as indicated.   Past Medical History:   Diagnosis Date   Anxiety    Back pain    Depression    Diabetes mellitus without complication (HCC)    GERD (gastroesophageal reflux disease)    Hypothyroidism    Palpitations    Pre-diabetes    Vitamin D deficiency      Past Surgical History:  Procedure Laterality Date   BIOPSY  03/11/2023   Procedure: BIOPSY;  Surgeon: Dolores Frame, MD;  [facility]: [facility];  Service: Gastroenterology;;   ESOPHAGOGASTRODUODENOSCOPY (EGD) WITH PROPOFOL N/A 03/11/2023   Procedure: ESOPHAGOGASTRODUODENOSCOPY (EGD) WITH PROPOFOL;  Surgeon: Dolores Frame, MD;  [facility]: [facility];  Service: Gastroenterology;  Laterality: N/A;  7:30AM;ASA 1-2   MOUTH SURGERY      Allergies and medications reviewed and updated.   Current Outpatient Medications:    amphetamine-dextroamphetamine (ADDERALL XR) 15 MG 24 hr capsule, Take 1 capsule by mouth every morning., Disp: 30 capsule, Rfl: 0   amphetamine-dextroamphetamine (ADDERALL) 10 MG tablet, Take 1 tablet (10 mg total) by mouth daily as needed (uncontrolled ADHD)., Disp: 30 tablet, Rfl: 0   Barberry-Oreg Grape-Goldenseal (BERBERINE COMPLEX PO), Take 1 capsule by mouth 2 (two) times daily., Disp: , Rfl:    cetirizine (ZYRTEC) 10 MG tablet, Take 10 mg by mouth at bedtime., Disp: , Rfl:    Cholecalciferol (VITAMIN D) 125 MCG (5000 UT) CAPS, Take 1 capsule by mouth daily., Disp: 30 capsule, Rfl: 0   CHOLINE BITARTRATE PO, Take 250 mg by mouth daily., Disp: , Rfl:    clomiPRAMINE (ANAFRANIL) 25 MG capsule, Take 1 capsule (25 mg total) by mouth daily. (Patient taking differently: Take 25 mg by mouth at bedtime.), Disp: 90 capsule, Rfl: 3   fluticasone (FLONASE) 50 MCG/ACT nasal spray, Place 1 spray into both nostrils daily. (Patient not taking: Reported on 04/26/2023), Disp: , Rfl:    glucose blood (ACCU-CHEK GUIDE) test strip, Use as instructed (Patient taking differently: 1 each by Other route daily at 6 (six) AM. Use as instructed),  Disp: 100 each, Rfl: 12   GuanFACINE HCl 3 MG TB24, TAKE 1 TABLET BY MOUTH EVERY EVENING., Disp: 90 tablet, Rfl: 1   hydrOXYzine (ATARAX) 10 MG tablet, Take 10 mg by mouth 3 (three) times  daily as needed for anxiety., Disp: , Rfl:    ibuprofen (ADVIL) 200 MG tablet, Take 600 mg by mouth every 8 (eight) hours as needed for moderate pain., Disp: , Rfl:    Inositol Niacinate 250 MG TABS, Take 250 mg by mouth daily., Disp: , Rfl:    levothyroxine (SYNTHROID) 50 MCG tablet, Take 50-100 mcg by mouth daily before breakfast. 100 mcg on Sun and Wed morning, 50 mcg all other days, Disp: , Rfl:    magnesium gluconate (MAGONATE) 500 MG tablet, Take 500 mg by mouth daily., Disp: , Rfl:    meloxicam (MOBIC) 7.5 MG tablet, Take 1 tablet (7.5 mg total) by mouth daily., Disp: 30 tablet, Rfl: 0   metFORMIN (GLUCOPHAGE-XR) 500 MG 24 hr tablet, Take 1 tablet (500 mg total) by mouth 2 (two) times daily with a meal., Disp: 90 tablet, Rfl: 0   Misc Natural Products (FOCUSED MIND PO), Take 2 capsules by mouth daily. True Focus, Disp: , Rfl:    Multiple Vitamin (MULTIVITAMIN) tablet, Take 1 tablet by mouth daily., Disp: , Rfl:    Omega-3 Fatty Acids (FISH OIL) 1000 MG CAPS, Take 200 mg by mouth daily., Disp: , Rfl:    pantoprazole (PROTONIX) 40 MG tablet, Take 1 tablet (40 mg total) by mouth 2 (two) times daily., Disp: 180 tablet, Rfl: 1   Probiotic Product (PROBIOTIC DAILY PO), Take 1 capsule by mouth daily. 25 Billion, Disp: , Rfl:   Allergies  Allergen Reactions   Wellbutrin [Bupropion] Tinitus    Objective:   There were no vitals taken for this visit.     04/26/2023    9:46 AM 03/11/2023    7:52 AM 03/11/2023    7:49 AM  Vitals with BMI  Height 5\' 5"     Weight 206 lbs 13 oz    BMI 34.41    Systolic 129 97 89  Diastolic 85 55 50  Pulse 92  73     Physical Exam Vitals and nursing note reviewed.  Constitutional:      Appearance: Normal appearance. She is normal weight.  HENT:     Head: Normocephalic  and atraumatic.  Skin:    General: Skin is warm and dry.  Neurological:     General: No focal deficit present.     Mental Status: She is alert and oriented to person, place, and time. Mental status is at baseline.  Psychiatric:        Mood and Affect: Mood normal.        Behavior: Behavior normal.        Thought Content: Thought content normal.        Judgment: Judgment normal.     Assessment & Plan:  There are no diagnoses linked to this encounter.    Follow up plan: No follow-ups on file.  Park Meo, FNP

## 2023-05-18 NOTE — Assessment & Plan Note (Signed)
Well controlled, continue current regimen. Refills provided. PDMP reviewed. Follow-up in 3 months.

## 2023-05-18 NOTE — Assessment & Plan Note (Signed)
A1c 7.4%. Unable to tolerate Trulicity and increase in Metformin dose. Continue Metformin XR to 500mg  daily and start Glipizide 2.5mg  daily. Follow-up in 3 months.

## 2023-05-18 NOTE — Progress Notes (Signed)
Virtual Visit via Video note  I connected with Nicole [name] on 05/18/23 at 0800 by video and verified that I am speaking with the correct person using two identifiers. Nicole [name] is currently located at home and no one is currently with her during visit. The provider, Park Meo, FNP is located in their office at time of visit.  I discussed the limitations, risks, security and privacy concerns of performing an evaluation and management service by video and the availability of in person appointments. I also discussed with the patient that there may be a patient responsible charge related to this service. The patient expressed understanding and agreed to proceed.  Subjective: PCP: Park Meo, FNP  No chief complaint on file.   HPI   HPI Patient is in today for follow-up for ADHD and diabetes. She is currently taking Adderall 15mg  daily and 10mg  PRN that she uses 1-2 times weekly around lunch time. She is also taking Metformin 500mg  BID and not tolerating the AM dose due to GI side effects. She would not like to try an injectable at this time.  ADHD FOLLOW UP ADHD status: controlled Satisfied with current therapy: yes Medication compliance:  excellent compliance Controlled substance contract: yes Previous psychiatry evaluation: yes Previous medications: adderall XR   Taking meds on weekends/vacations: occasionally Work/school performance:  good Difficulty sustaining attention/completing tasks: yes Distracted by extraneous stimuli: yes Does not listen when spoken to: yes  Fidgets with hands or feet: yes Unable to stay in seat: no Blurts out/interrupts others: no ADHD Medication Side Effects: no    Decreased appetite: no    Headache: no    Sleeping disturbance pattern: no    Irritability: no    Rebound effects (worse than baseline) off medication: no    Anxiousness: no    Dizziness: no    Tics: no  DIABETES Hypoglycemic episodes:no Polydipsia/polyuria:  no Visual disturbance: no Chest pain: no Paresthesias: no Glucose Monitoring: yes  Accucheck frequency: Daily  Fasting glucose: 160-180  Post prandial:  Evening:  Before meals: Taking Insulin?: no  Long acting insulin:  Short acting insulin: Blood Pressure Monitoring: not checking Retinal Examination: Up to Date Foot Exam: Up to Date Diabetic Education: Completed Pneumovax: unknown Influenza: unknown Aspirin: no   ROS: Per HPI  Current Outpatient Medications:    glipiZIDE (GLUCOTROL XL) 2.5 MG 24 hr tablet, Take 1 tablet (2.5 mg total) by mouth daily with breakfast., Disp: 90 tablet, Rfl: 1   amphetamine-dextroamphetamine (ADDERALL XR) 15 MG 24 hr capsule, Take 1 capsule by mouth every morning., Disp: 30 capsule, Rfl: 0   amphetamine-dextroamphetamine (ADDERALL) 10 MG tablet, Take 1 tablet (10 mg total) by mouth daily as needed (uncontrolled ADHD)., Disp: 30 tablet, Rfl: 0   Barberry-Oreg Grape-Goldenseal (BERBERINE COMPLEX PO), Take 1 capsule by mouth 2 (two) times daily., Disp: , Rfl:    cetirizine (ZYRTEC) 10 MG tablet, Take 10 mg by mouth at bedtime., Disp: , Rfl:    Cholecalciferol (VITAMIN D) 125 MCG (5000 UT) CAPS, Take 1 capsule by mouth daily., Disp: 30 capsule, Rfl: 0   CHOLINE BITARTRATE PO, Take 250 mg by mouth daily., Disp: , Rfl:    clomiPRAMINE (ANAFRANIL) 25 MG capsule, Take 1 capsule (25 mg total) by mouth daily. (Patient taking differently: Take 25 mg by mouth at bedtime.), Disp: 90 capsule, Rfl: 3   fluticasone (FLONASE) 50 MCG/ACT nasal spray, Place 1 spray into both nostrils daily. (Patient not taking: Reported on 04/26/2023), Disp: , Rfl:  glucose blood (ACCU-CHEK GUIDE) test strip, Use as instructed (Patient taking differently: 1 each by Other route daily at 6 (six) AM. Use as instructed), Disp: 100 each, Rfl: 12   GuanFACINE HCl 3 MG TB24, TAKE 1 TABLET BY MOUTH EVERY EVENING., Disp: 90 tablet, Rfl: 1   hydrOXYzine (ATARAX) 10 MG tablet, Take 10 mg by mouth  3 (three) times daily as needed for anxiety., Disp: , Rfl:    ibuprofen (ADVIL) 200 MG tablet, Take 600 mg by mouth every 8 (eight) hours as needed for moderate pain., Disp: , Rfl:    Inositol Niacinate 250 MG TABS, Take 250 mg by mouth daily., Disp: , Rfl:    levothyroxine (SYNTHROID) 50 MCG tablet, Take 50-100 mcg by mouth daily before breakfast. 100 mcg on Sun and Wed morning, 50 mcg all other days, Disp: , Rfl:    magnesium gluconate (MAGONATE) 500 MG tablet, Take 500 mg by mouth daily., Disp: , Rfl:    meloxicam (MOBIC) 7.5 MG tablet, Take 1 tablet (7.5 mg total) by mouth daily., Disp: 30 tablet, Rfl: 0   metFORMIN (GLUCOPHAGE-XR) 500 MG 24 hr tablet, Take 1 tablet (500 mg total) by mouth 2 (two) times daily with a meal., Disp: 90 tablet, Rfl: 0   Misc Natural Products (FOCUSED MIND PO), Take 2 capsules by mouth daily. True Focus, Disp: , Rfl:    Multiple Vitamin (MULTIVITAMIN) tablet, Take 1 tablet by mouth daily., Disp: , Rfl:    Omega-3 Fatty Acids (FISH OIL) 1000 MG CAPS, Take 200 mg by mouth daily., Disp: , Rfl:    pantoprazole (PROTONIX) 40 MG tablet, Take 1 tablet (40 mg total) by mouth 2 (two) times daily., Disp: 180 tablet, Rfl: 1   Probiotic Product (PROBIOTIC DAILY PO), Take 1 capsule by mouth daily. 25 Billion, Disp: , Rfl:   Observations/Objective: Physical Exam Constitutional:      General: She is not in acute distress.    Appearance: Normal appearance. She is not toxic-appearing.  Eyes:     Conjunctiva/sclera: Conjunctivae normal.  Pulmonary:     Effort: Pulmonary effort is normal. No respiratory distress.  Skin:    Coloration: Skin is not pale.  Neurological:     General: No focal deficit present.     Mental Status: She is alert and oriented to person, place, and time.  Psychiatric:        Mood and Affect: Mood normal.        Behavior: Behavior normal.        Thought Content: Thought content normal.        Judgment: Judgment normal.    Assessment and  Plan: Attention deficit hyperactivity disorder (ADHD), combined type Assessment & Plan: Well controlled, continue current regimen. Refills provided. PDMP reviewed. Follow-up in 3 months.   Type 2 diabetes mellitus with other specified complication, without long-term current use of insulin (HCC) Assessment & Plan: A1c 7.4%. Unable to tolerate Trulicity and increase in Metformin dose. Continue Metformin XR to 500mg  daily and start Glipizide 2.5mg  daily. Follow-up in 3 months.   Other orders -     glipiZIDE ER; Take 1 tablet (2.5 mg total) by mouth daily with breakfast.  Dispense: 90 tablet; Refill: 1    Follow Up Instructions: Return in about 3 months (around 08/17/2023) for chronic follow-up with labs 1 week prior.   I discussed the assessment and treatment plan with the patient. The patient was provided an opportunity to ask questions and all were answered. The patient agreed  with the plan and demonstrated an understanding of the instructions.   The patient was advised to call back or seek an in-person evaluation if the symptoms worsen or if the condition fails to improve as anticipated.  The above assessment and management plan was discussed with the patient. The patient verbalized understanding of and has agreed to the management plan. Patient is aware to call the clinic if symptoms persist or worsen. Patient is aware when to return to the clinic for a follow-up visit. Patient educated on when it is appropriate to go to the emergency department.   Time call ended: 0814  I provided 14 minutes of face-to-face time during this encounter.   Kurtis Bushman, MSN, APRN, FNP-C [facility]

## 2023-05-29 ENCOUNTER — Other Ambulatory Visit: Payer: Self-pay | Admitting: Family Medicine

## 2023-06-09 ENCOUNTER — Encounter: Payer: Self-pay | Admitting: Family Medicine

## 2023-06-15 ENCOUNTER — Encounter: Payer: Self-pay | Admitting: Family Medicine

## 2023-06-16 ENCOUNTER — Telehealth: Payer: Self-pay

## 2023-06-16 ENCOUNTER — Other Ambulatory Visit: Payer: Self-pay | Admitting: Family Medicine

## 2023-06-16 MED ORDER — AMPHETAMINE-DEXTROAMPHET ER 15 MG PO CP24: 15.0000 mg | ORAL_CAPSULE | ORAL | 0 refills | Status: DC

## 2023-06-16 NOTE — Telephone Encounter (Signed)
Pt called in to inquire about her refill for this med amphetamine-dextroamphetamine (ADDERALL XR) 15 MG 24 hr capsule [[id number]]. Pt states that she is out of this med and really needs this med sent in to pharmacy please.   Cb#: [phone]

## 2023-06-21 ENCOUNTER — Encounter: Payer: Self-pay | Admitting: Family Medicine

## 2023-06-21 ENCOUNTER — Other Ambulatory Visit: Payer: Self-pay | Admitting: Family Medicine

## 2023-06-29 ENCOUNTER — Other Ambulatory Visit: Payer: Self-pay | Admitting: Family Medicine

## 2023-07-01 ENCOUNTER — Telehealth: Payer: Self-pay

## 2023-07-01 NOTE — Telephone Encounter (Signed)
Pt called in to request a refill of this med GuanFACINE HCl 3 MG TB24 [[id number]].  LOV: 05/18/23  PHARMACY: [id number] - [place], [place] - [address] AT [facility] [address], [place] [address] Phone: [phone]  Fax: [phone]   CB#: [phone]

## 2023-07-27 ENCOUNTER — Other Ambulatory Visit: Payer: Self-pay | Admitting: Family Medicine

## 2023-08-01 ENCOUNTER — Encounter: Payer: Self-pay | Admitting: Family Medicine

## 2023-08-01 ENCOUNTER — Other Ambulatory Visit: Payer: Self-pay | Admitting: Family Medicine

## 2023-08-01 MED ORDER — MELOXICAM 7.5 MG PO TABS: 7.5000 mg | ORAL_TABLET | Freq: Every day | ORAL | 0 refills | Status: DC

## 2023-08-01 MED ORDER — AMPHETAMINE-DEXTROAMPHET ER 15 MG PO CP24: 15.0000 mg | ORAL_CAPSULE | ORAL | 0 refills | Status: DC

## 2023-08-15 ENCOUNTER — Other Ambulatory Visit: Payer: Self-pay

## 2023-08-15 ENCOUNTER — Telehealth: Payer: Self-pay

## 2023-08-15 NOTE — Telephone Encounter (Signed)
LVM for pt to call to make an appt for a diabetic eye exam.

## 2023-08-23 ENCOUNTER — Other Ambulatory Visit: Payer: Self-pay | Admitting: Family Medicine

## 2023-08-23 DIAGNOSIS — F32A Depression, unspecified: Secondary | ICD-10-CM

## 2023-08-23 NOTE — Telephone Encounter (Unsigned)
Copied from CRM [id number]. Topic: Clinical - Medication Refill >> Aug 23, 2023  3:30 PM Hector Shade B wrote: Most Recent Primary Care Visit:  Provider: Kurtis Bushman S  Department: [facility]  Visit Type: MYCHART VIDEO VISIT  Date: 05/18/2023  Medication:  clomiPRAMINE (ANAFRANIL) 25 MG capsule   Has the patient contacted their pharmacy? Yes (Agent: If no, request that the patient contact the pharmacy for the refill. If patient does not wish to contact the pharmacy document the reason why and proceed with request.) (Agent: If yes, when and what did the pharmacy advise?)  Is this the correct pharmacy for this prescription? Yes If no, delete pharmacy and type the correct one.  This is the patient's preferred pharmacy:  [id number] - [place], [place] - [address] AT [facility] [address] [place] [address] Phone: [phone] Fax: [phone]   Has the prescription been filled recently? Yes  Is the patient out of the medication? Yes (patient stated she has taken her last one and she's been told she shouldn't go without)  Has the patient been seen for an appointment in the last year OR does the patient have an upcoming appointment? Yes  Can we respond through MyChart? Yes  Agent: Please be advised that Rx refills may take up to 3 business days. We ask that you follow-up with your pharmacy.

## 2023-08-30 ENCOUNTER — Encounter: Payer: Self-pay | Admitting: Family Medicine

## 2023-08-30 ENCOUNTER — Other Ambulatory Visit: Payer: Self-pay | Admitting: Family Medicine

## 2023-08-30 DIAGNOSIS — E038 Other specified hypothyroidism: Secondary | ICD-10-CM

## 2023-08-30 DIAGNOSIS — E1169 Type 2 diabetes mellitus with other specified complication: Secondary | ICD-10-CM

## 2023-08-30 DIAGNOSIS — E7849 Other hyperlipidemia: Secondary | ICD-10-CM

## 2023-08-30 DIAGNOSIS — Z6834 Body mass index (BMI) 34.0-34.9, adult: Secondary | ICD-10-CM

## 2023-09-05 ENCOUNTER — Other Ambulatory Visit: Payer: Self-pay | Admitting: Family Medicine

## 2023-09-05 MED ORDER — AMPHETAMINE-DEXTROAMPHET ER 15 MG PO CP24: 15.0000 mg | ORAL_CAPSULE | ORAL | 0 refills | Status: DC

## 2023-09-23 ENCOUNTER — Encounter: Payer: Self-pay | Admitting: Family Medicine

## 2023-09-23 NOTE — Addendum Note (Signed)
 Addended by: Arta Silence on: 09/23/2023 01:02 PM   Modules accepted: Orders

## 2023-09-26 MED ORDER — LEVOTHYROXINE SODIUM 50 MCG PO TABS: 50.0000 ug | ORAL_TABLET | Freq: Every day | ORAL | 0 refills | Status: AC

## 2023-09-28 MED ORDER — AMPHETAMINE-DEXTROAMPHET ER 15 MG PO CP24: 15.0000 mg | ORAL_CAPSULE | ORAL | 0 refills | Status: DC

## 2023-09-29 ENCOUNTER — Other Ambulatory Visit: Payer: Medicaid Other

## 2023-09-29 DIAGNOSIS — E7849 Other hyperlipidemia: Secondary | ICD-10-CM

## 2023-09-29 DIAGNOSIS — E1169 Type 2 diabetes mellitus with other specified complication: Secondary | ICD-10-CM

## 2023-09-29 DIAGNOSIS — E038 Other specified hypothyroidism: Secondary | ICD-10-CM

## 2023-09-29 DIAGNOSIS — E6609 Other obesity due to excess calories: Secondary | ICD-10-CM

## 2023-09-30 LAB — CBC WITH DIFFERENTIAL/PLATELET
Absolute Lymphocytes: 3123 {cells}/uL (ref 850–3900)
Absolute Monocytes: 728 {cells}/uL (ref 200–950)
Basophils Absolute: 39 {cells}/uL (ref 0–200)
Basophils Relative: 0.4 %
Eosinophils Absolute: 330 {cells}/uL (ref 15–500)
Eosinophils Relative: 3.4 %
HCT: 38.5 % (ref 35.0–45.0)
Hemoglobin: 12.6 g/dL (ref 11.7–15.5)
MCH: 29 pg (ref 27.0–33.0)
MCHC: 32.7 g/dL (ref 32.0–36.0)
MCV: 88.5 fL (ref 80.0–100.0)
MPV: 9.8 fL (ref 7.5–12.5)
Monocytes Relative: 7.5 %
Neutro Abs: 5481 {cells}/uL (ref 1500–7800)
Neutrophils Relative %: 56.5 %
Platelets: 403 10*3/uL — ABNORMAL HIGH (ref 140–400)
RBC: 4.35 10*6/uL (ref 3.80–5.10)
RDW: 11.9 % (ref 11.0–15.0)
Total Lymphocyte: 32.2 %
WBC: 9.7 10*3/uL (ref 3.8–10.8)

## 2023-09-30 LAB — LIPID PANEL
Cholesterol: 172 mg/dL (ref <200)
HDL: 47 mg/dL — ABNORMAL LOW (ref >=50)
LDL Cholesterol (Calc): 98 mg/dL
Non-HDL Cholesterol (Calc): 125 mg/dL (ref <130)
Total CHOL/HDL Ratio: 3.7 (calc) (ref <5.0)
Triglycerides: 167 mg/dL — ABNORMAL HIGH (ref <150)

## 2023-09-30 LAB — COMPLETE METABOLIC PANEL WITH GFR
AG Ratio: 1.6 (calc) (ref 1.0–2.5)
ALT: 23 U/L (ref 6–29)
AST: 16 U/L (ref 10–30)
Albumin: 4.3 g/dL (ref 3.6–5.1)
Alkaline phosphatase (APISO): 77 U/L (ref 31–125)
BUN: 9 mg/dL (ref 7–25)
CO2: 28 mmol/L (ref 20–32)
Calcium: 9.4 mg/dL (ref 8.6–10.2)
Chloride: 99 mmol/L (ref 98–110)
Creat: 0.56 mg/dL (ref 0.50–0.99)
Globulin: 2.7 g/dL (ref 1.9–3.7)
Glucose, Bld: 193 mg/dL — ABNORMAL HIGH (ref 65–99)
Potassium: 4.2 mmol/L (ref 3.5–5.3)
Sodium: 137 mmol/L (ref 135–146)
Total Bilirubin: 0.3 mg/dL (ref 0.2–1.2)
Total Protein: 7 g/dL (ref 6.1–8.1)
eGFR: 116 mL/min/{1.73_m2} (ref >=60)

## 2023-09-30 LAB — HEMOGLOBIN A1C
Hgb A1c MFr Bld: 9 %{Hb} — ABNORMAL HIGH (ref <5.7)
Mean Plasma Glucose: 212 mg/dL
eAG (mmol/L): 11.7 mmol/L

## 2023-09-30 LAB — TSH: TSH: 2.25 m[IU]/L

## 2023-10-06 ENCOUNTER — Encounter: Payer: Self-pay | Admitting: Family Medicine

## 2023-10-06 ENCOUNTER — Ambulatory Visit: Payer: Medicaid Other | Admitting: Family Medicine

## 2023-10-06 VITALS — BP 118/74 | HR 81 | Temp 98.5°F | Ht 65.0 in | Wt 209.0 lb

## 2023-10-06 DIAGNOSIS — E1169 Type 2 diabetes mellitus with other specified complication: Secondary | ICD-10-CM | POA: Diagnosis not present

## 2023-10-06 DIAGNOSIS — F902 Attention-deficit hyperactivity disorder, combined type: Secondary | ICD-10-CM | POA: Diagnosis not present

## 2023-10-06 DIAGNOSIS — E038 Other specified hypothyroidism: Secondary | ICD-10-CM

## 2023-10-06 DIAGNOSIS — E785 Hyperlipidemia, unspecified: Secondary | ICD-10-CM | POA: Diagnosis not present

## 2023-10-06 DIAGNOSIS — Z7984 Long term (current) use of oral hypoglycemic drugs: Secondary | ICD-10-CM

## 2023-10-06 NOTE — Assessment & Plan Note (Signed)
Uncontrolled, LDL 98. Counseled on importance of lifestyle modifications and goal LDL <70 as well as recommendation for statin in diabetics. She would like to work on diet and exercise at this time. I recommend consuming a heart healthy diet such as Mediterranean diet or DASH diet with whole grains, fruits, vegetable, fish, lean meats, nuts, and olive oil. Limit sweets and processed foods. I also encourage moderate intensity exercise 150 minutes weekly. This is 3-5 times weekly for 30-50 minutes each session. Goal should be pace of 3 miles/hours, or walking 1.5 miles in 30 minutes. The 10-year ASCVD risk score (Arnett DK, et al., 2019) is: 1.2% Will recheck in 3 months.

## 2023-10-06 NOTE — Assessment & Plan Note (Signed)
A1c 9.0%. Unable to tolerate Trulicity and increase in Metformin dose as well as Glipizide due to GI side effects. She would like to try restarting Glipizide, we did discuss alternatives. She is seeing Endocrinology soon. She does admit poor diet and lack of exercise, counseled on importance of these and encouraged heart healthy diet such as Mediterranean diet with whole grains, fruits, vegetable, fish, lean meats, nuts, and olive oil. Limit salt. Encouraged moderate walking, 3-5 times/week for 30-50 minutes each session. Aim for at least 150 minutes.week. Goal should be pace of 3 miles/hours, or walking 1.5 miles in 30 minutes. Seek medical care for urinary frequency, extreme thirst, vision changes, lightheadedness, dizziness.  Follow up in 3 months.

## 2023-10-06 NOTE — Assessment & Plan Note (Addendum)
Well controlled on current regimen. She does see psychiatry and therapist. Continue Adderall XR 15mg  daily, Anafranil 25mg  daily, and Guanfacine 3mg  daily and follow up in 3 months via video visit. Encouraged adequate daily calorie intake, especially in early morning and in evening. Report changes in weight, appetite, or breakthrough symptoms.

## 2023-10-06 NOTE — Progress Notes (Addendum)
Subjective:  HPI: Nicole [name] is a 43 y.o. female presenting on 10/06/2023 for Follow-up (Diabetic f/u)   HPI Patient is in today for chronic condition management including diabetes, ADHD, and HLD. She does not monitor her BG at home. She admits to a poor, carb heavy diet. She started Glipizide 2.5mg  daily but had heartburn so didn't continue. She is seeing Endocrinology in 2 weeks.   DIABETES Hypoglycemic episodes:no Polydipsia/polyuria: no Visual disturbance: no Chest pain: no Paresthesias: no Glucose Monitoring: no  Accucheck frequency: Not Checking  Fasting glucose:  Post prandial:  Evening:  Before meals: Taking Insulin?: no  Long acting insulin:  Short acting insulin: Blood Pressure Monitoring: not checking Retinal Examination: Up to Date Foot Exam: Up to Date Diabetic Education: Completed Pneumovax:  declines Influenza:  declines Aspirin:  declines  HYPERLIPIDEMIA Hyperlipidemia status: poor compliance Satisfied with current treatment?  no Side effects:   unmedicated Medication compliance:  Past cholesterol meds:  Supplements: none Aspirin:  no The 10-year ASCVD risk score (Arnett DK, et al., 2019) is: 1.2%   Values used to calculate the score:     Age: 30 years     Sex: Female     Is Non-Hispanic African American: No     Diabetic: Yes     Tobacco smoker: No     Systolic Blood Pressure: 118 mmHg     Is BP treated: No     HDL Cholesterol: 47 mg/dL     Total Cholesterol: 172 mg/dL Chest pain:  no Coronary artery disease:  no Family history CAD:  no Family history early CAD:  no  ADHD FOLLOW UP ADHD status: controlled Satisfied with current therapy: yes Medication compliance:  excellent compliance Controlled substance contract: yes Previous psychiatry evaluation: yes Previous medications: yes adderall XR , Anafranil, Guanfacine  Taking meds on weekends/vacations: occasionally Work/school performance:  good Difficulty sustaining  attention/completing tasks: no Distracted by extraneous stimuli: no Does not listen when spoken to: no  Fidgets with hands or feet: no Unable to stay in seat: no Blurts out/interrupts others: no ADHD Medication Side Effects: no    Decreased appetite: no    Headache: no    Sleeping disturbance pattern: no    Irritability: no    Rebound effects (worse than baseline) off medication: no    Anxiousness: no    Dizziness: no    Tics: no   Review of Systems  All other systems reviewed and are negative.   Relevant past medical history reviewed and updated as indicated.   Past Medical History:  Diagnosis Date   Anxiety    Back pain    Depression    Diabetes mellitus without complication (HCC)    GERD (gastroesophageal reflux disease)    Hypothyroidism    Palpitations    Pre-diabetes    Vitamin D deficiency      Past Surgical History:  Procedure Laterality Date   BIOPSY  03/11/2023   Procedure: BIOPSY;  Surgeon: Dolores Frame, MD;  Location: [facility];  Service: Gastroenterology;;   ESOPHAGOGASTRODUODENOSCOPY (EGD) WITH PROPOFOL N/A 03/11/2023   Procedure: ESOPHAGOGASTRODUODENOSCOPY (EGD) WITH PROPOFOL;  Surgeon: Dolores Frame, MD;  Location: [facility];  Service: Gastroenterology;  Laterality: N/A;  7:30AM;ASA 1-2   MOUTH SURGERY      Allergies and medications reviewed and updated.   Current Outpatient Medications:    Barberry-Oreg Grape-Goldenseal (BERBERINE COMPLEX PO), Take 1 capsule by mouth 2 (two) times daily., Disp: , Rfl:    cetirizine (  ZYRTEC) 10 MG tablet, Take 10 mg by mouth at bedtime., Disp: , Rfl:    Cholecalciferol (VITAMIN D) 125 MCG (5000 UT) CAPS, Take 1 capsule by mouth daily., Disp: 30 capsule, Rfl: 0   CHOLINE BITARTRATE PO, Take 250 mg by mouth daily., Disp: , Rfl:    clomiPRAMINE (ANAFRANIL) 25 MG capsule, Take 1 capsule (25 mg total) by mouth at bedtime., Disp: 90 capsule, Rfl: 1   GuanFACINE HCl 3 MG TB24, TAKE 1 TABLET  BY MOUTH EVERY DAY IN THE EVENING, Disp: 90 tablet, Rfl: 1   hydrOXYzine (ATARAX) 10 MG tablet, Take 10 mg by mouth 3 (three) times daily as needed for anxiety., Disp: , Rfl:    ibuprofen (ADVIL) 200 MG tablet, Take 600 mg by mouth every 8 (eight) hours as needed for moderate pain., Disp: , Rfl:    Inositol Niacinate 250 MG TABS, Take 250 mg by mouth daily., Disp: , Rfl:    levothyroxine (SYNTHROID) 50 MCG tablet, Take 1-2 tablets (50-100 mcg total) by mouth daily before breakfast. 100 mcg on Sun and Wed morning, 50 mcg all other days, Disp: 60 tablet, Rfl: 0   magnesium gluconate (MAGONATE) 500 MG tablet, Take 500 mg by mouth daily., Disp: , Rfl:    meloxicam (MOBIC) 7.5 MG tablet, TAKE 1 TABLET BY MOUTH EVERY DAY, Disp: 30 tablet, Rfl: 0   metFORMIN (GLUCOPHAGE-XR) 500 MG 24 hr tablet, Take 1 tablet (500 mg total) by mouth 2 (two) times daily with a meal., Disp: 90 tablet, Rfl: 0   Misc Natural Products (FOCUSED MIND PO), Take 2 capsules by mouth daily. True Focus, Disp: , Rfl:    Multiple Vitamin (MULTIVITAMIN) tablet, Take 1 tablet by mouth daily., Disp: , Rfl:    Omega-3 Fatty Acids (FISH OIL) 1000 MG CAPS, Take 200 mg by mouth daily., Disp: , Rfl:    pantoprazole (PROTONIX) 40 MG tablet, Take 1 tablet (40 mg total) by mouth 2 (two) times daily., Disp: 180 tablet, Rfl: 1   Probiotic Product (PROBIOTIC DAILY PO), Take 1 capsule by mouth daily. 25 Billion, Disp: , Rfl:    amphetamine-dextroamphetamine (ADDERALL XR) 15 MG 24 hr capsule, Take 1 capsule by mouth every morning., Disp: 30 capsule, Rfl: 0   amphetamine-dextroamphetamine (ADDERALL) 10 MG tablet, Take 1 tablet (10 mg total) by mouth daily as needed (uncontrolled ADHD)., Disp: 30 tablet, Rfl: 0   glipiZIDE (GLUCOTROL XL) 2.5 MG 24 hr tablet, Take 1 tablet (2.5 mg total) by mouth daily with breakfast. (Patient not taking: Reported on 10/06/2023), Disp: 90 tablet, Rfl: 1  Allergies  Allergen Reactions   Wellbutrin [Bupropion] Tinitus     Objective:   BP 118/74   Pulse 81   Temp 98.5 F (36.9 C) (Oral)   Ht 5\' 5"  (1.651 m)   Wt 209 lb (94.8 kg)   LMP 09/21/2023 (Approximate)   SpO2 97%   BMI 34.78 kg/m      10/06/2023    2:10 PM 04/26/2023    9:46 AM 03/11/2023    7:52 AM  Vitals with BMI  Height 5\' 5"  5\' 5"    Weight 209 lbs 206 lbs 13 oz   BMI 34.78 34.41   Systolic 118 129 97  Diastolic 74 85 55  Pulse 81 92      Physical Exam Vitals and nursing note reviewed.  Constitutional:      Appearance: Normal appearance. She is normal weight.  HENT:     Head: Normocephalic and atraumatic.  Skin:  General: Skin is warm and dry.  Neurological:     General: No focal deficit present.     Mental Status: She is alert and oriented to person, place, and time. Mental status is at baseline.  Psychiatric:        Mood and Affect: Mood normal.        Behavior: Behavior normal.        Thought Content: Thought content normal.        Judgment: Judgment normal.     Assessment & Plan:  Type 2 diabetes mellitus with other specified complication, without long-term current use of insulin (HCC) Assessment & Plan: A1c 9.0%. Unable to tolerate Trulicity and increase in Metformin dose as well as Glipizide due to GI side effects. She would like to try restarting Glipizide, we did discuss alternatives. She is seeing Endocrinology soon. She does admit poor diet and lack of exercise, counseled on importance of these and encouraged heart healthy diet such as Mediterranean diet with whole grains, fruits, vegetable, fish, lean meats, nuts, and olive oil. Limit salt. Encouraged moderate walking, 3-5 times/week for 30-50 minutes each session. Aim for at least 150 minutes.week. Goal should be pace of 3 miles/hours, or walking 1.5 miles in 30 minutes. Seek medical care for urinary frequency, extreme thirst, vision changes, lightheadedness, dizziness.  Follow up in 3 months.    Orders: -     Microalbumin / creatinine urine  ratio  Attention deficit hyperactivity disorder (ADHD), combined type Assessment & Plan: Well controlled on current regimen. She does see psychiatry and therapist. Continue Adderall XR 15mg  daily, Anafranil 25mg  daily, and Guanfacine 3mg  daily and follow up in 3 months via video visit. Encouraged adequate daily calorie intake, especially in early morning and in evening. Report changes in weight, appetite, or breakthrough symptoms.    Hyperlipidemia associated with type 2 diabetes mellitus (HCC) Assessment & Plan: Uncontrolled, LDL 98. Counseled on importance of lifestyle modifications and goal LDL <70 as well as recommendation for statin in diabetics. She would like to work on diet and exercise at this time. I recommend consuming a heart healthy diet such as Mediterranean diet or DASH diet with whole grains, fruits, vegetable, fish, lean meats, nuts, and olive oil. Limit sweets and processed foods. I also encourage moderate intensity exercise 150 minutes weekly. This is 3-5 times weekly for 30-50 minutes each session. Goal should be pace of 3 miles/hours, or walking 1.5 miles in 30 minutes. The 10-year ASCVD risk score (Arnett DK, et al., 2019) is: 1.2% Will recheck in 3 months.    Other specified hypothyroidism Assessment & Plan: Chronic well controlled. Continue Synthroid daily. Seeing Endo next month.      Follow up plan: Return in about 3 months (around 01/04/2024) for annual physical with labs 1 week prior.  Park Meo, FNP

## 2023-10-06 NOTE — Assessment & Plan Note (Signed)
Chronic well controlled. Continue Synthroid daily. Seeing Endo next month.

## 2023-10-07 LAB — MICROALBUMIN / CREATININE URINE RATIO
Creatinine, Urine: 139 mg/dL (ref 20–275)
Microalb Creat Ratio: 7 mg/g{creat} (ref <30)
Microalb, Ur: 1 mg/dL

## 2023-10-10 ENCOUNTER — Encounter: Payer: Self-pay | Admitting: Family Medicine

## 2023-10-15 ENCOUNTER — Other Ambulatory Visit: Payer: Self-pay | Admitting: Family Medicine

## 2023-10-17 MED ORDER — AMPHETAMINE-DEXTROAMPHETAMINE 10 MG PO TABS: 10.0000 mg | ORAL_TABLET | Freq: Every day | ORAL | 0 refills | Status: DC | PRN

## 2023-10-24 ENCOUNTER — Other Ambulatory Visit: Payer: Self-pay | Admitting: Family Medicine

## 2023-10-24 MED ORDER — AMPHETAMINE-DEXTROAMPHET ER 15 MG PO CP24: 15.0000 mg | ORAL_CAPSULE | ORAL | 0 refills | Status: DC

## 2023-10-25 ENCOUNTER — Encounter: Payer: Self-pay | Admitting: Family Medicine

## 2023-10-26 ENCOUNTER — Other Ambulatory Visit: Payer: Self-pay | Admitting: Family Medicine

## 2023-10-26 DIAGNOSIS — E11319 Type 2 diabetes mellitus with unspecified diabetic retinopathy without macular edema: Secondary | ICD-10-CM

## 2023-10-29 ENCOUNTER — Other Ambulatory Visit (INDEPENDENT_AMBULATORY_CARE_PROVIDER_SITE_OTHER): Payer: Self-pay | Admitting: Gastroenterology

## 2023-11-21 ENCOUNTER — Other Ambulatory Visit: Payer: Self-pay | Admitting: Family Medicine

## 2023-11-22 MED ORDER — AMPHETAMINE-DEXTROAMPHET ER 15 MG PO CP24: 15.0000 mg | ORAL_CAPSULE | ORAL | 0 refills | Status: DC

## 2023-12-12 ENCOUNTER — Other Ambulatory Visit: Payer: Self-pay | Admitting: Family Medicine

## 2023-12-12 ENCOUNTER — Encounter: Payer: Self-pay | Admitting: Family Medicine

## 2023-12-12 MED ORDER — FLUCONAZOLE 150 MG PO TABS: 150.0000 mg | ORAL_TABLET | Freq: Once | ORAL | 0 refills | Status: AC

## 2023-12-17 ENCOUNTER — Other Ambulatory Visit: Payer: Self-pay | Admitting: Family Medicine

## 2023-12-22 ENCOUNTER — Other Ambulatory Visit: Payer: Self-pay | Admitting: Family Medicine

## 2023-12-22 MED ORDER — FLUCONAZOLE 150 MG PO TABS: 150.0000 mg | ORAL_TABLET | Freq: Once | ORAL | 0 refills | Status: AC

## 2023-12-28 ENCOUNTER — Other Ambulatory Visit: Payer: Self-pay

## 2023-12-28 ENCOUNTER — Encounter: Payer: Self-pay | Admitting: Family Medicine

## 2023-12-28 DIAGNOSIS — E038 Other specified hypothyroidism: Secondary | ICD-10-CM

## 2023-12-28 DIAGNOSIS — E1169 Type 2 diabetes mellitus with other specified complication: Secondary | ICD-10-CM

## 2023-12-28 DIAGNOSIS — E7849 Other hyperlipidemia: Secondary | ICD-10-CM

## 2023-12-30 LAB — CBC WITH DIFFERENTIAL/PLATELET
Absolute Lymphocytes: 2781 {cells}/uL (ref 850–3900)
Absolute Monocytes: 580 {cells}/uL (ref 200–950)
Basophils Absolute: 28 {cells}/uL (ref 0–200)
Basophils Relative: 0.4 %
Eosinophils Absolute: 200 {cells}/uL (ref 15–500)
Eosinophils Relative: 2.9 %
HCT: 39.4 % (ref 35.0–45.0)
Hemoglobin: 12.7 g/dL (ref 11.7–15.5)
MCH: 27.2 pg (ref 27.0–33.0)
MCHC: 32.2 g/dL (ref 32.0–36.0)
MCV: 84.4 fL (ref 80.0–100.0)
MPV: 10.2 fL (ref 7.5–12.5)
Monocytes Relative: 8.4 %
Neutro Abs: 3312 {cells}/uL (ref 1500–7800)
Neutrophils Relative %: 48 %
Platelets: 453 10*3/uL — ABNORMAL HIGH (ref 140–400)
RBC: 4.67 10*6/uL (ref 3.80–5.10)
RDW: 12.7 % (ref 11.0–15.0)
Total Lymphocyte: 40.3 %
WBC: 6.9 10*3/uL (ref 3.8–10.8)

## 2023-12-30 LAB — COMPLETE METABOLIC PANEL WITHOUT GFR
AG Ratio: 1.6 (calc) (ref 1.0–2.5)
ALT: 14 U/L (ref 6–29)
AST: 13 U/L (ref 10–30)
Albumin: 4.4 g/dL (ref 3.6–5.1)
Alkaline phosphatase (APISO): 74 U/L (ref 31–125)
BUN: 14 mg/dL (ref 7–25)
CO2: 30 mmol/L (ref 20–32)
Calcium: 9.6 mg/dL (ref 8.6–10.2)
Chloride: 101 mmol/L (ref 98–110)
Creat: 0.57 mg/dL (ref 0.50–0.99)
Globulin: 2.7 g/dL (ref 1.9–3.7)
Glucose, Bld: 149 mg/dL — ABNORMAL HIGH (ref 65–99)
Potassium: 4.1 mmol/L (ref 3.5–5.3)
Sodium: 138 mmol/L (ref 135–146)
Total Bilirubin: 0.4 mg/dL (ref 0.2–1.2)
Total Protein: 7.1 g/dL (ref 6.1–8.1)

## 2023-12-30 LAB — LIPID PANEL
Cholesterol: 128 mg/dL (ref <200)
HDL: 47 mg/dL — ABNORMAL LOW (ref >=50)
LDL Cholesterol (Calc): 61 mg/dL
Non-HDL Cholesterol (Calc): 81 mg/dL (ref <130)
Total CHOL/HDL Ratio: 2.7 (calc) (ref <5.0)
Triglycerides: 110 mg/dL (ref <150)

## 2023-12-30 LAB — TSH: TSH: 1.77 m[IU]/L

## 2023-12-30 LAB — HEMOGLOBIN A1C
Hgb A1c MFr Bld: 7 % — ABNORMAL HIGH (ref <5.7)
Mean Plasma Glucose: 154 mg/dL
eAG (mmol/L): 8.5 mmol/L

## 2024-01-02 ENCOUNTER — Other Ambulatory Visit: Payer: Self-pay | Admitting: Family Medicine

## 2024-01-02 ENCOUNTER — Other Ambulatory Visit: Payer: Self-pay

## 2024-01-02 MED ORDER — METFORMIN HCL ER 500 MG PO TB24: 500.0000 mg | ORAL_TABLET | Freq: Two times a day (BID) | ORAL | 0 refills | Status: DC

## 2024-01-03 MED ORDER — AMPHETAMINE-DEXTROAMPHET ER 15 MG PO CP24: 15.0000 mg | ORAL_CAPSULE | ORAL | 0 refills | Status: DC

## 2024-01-03 MED ORDER — METFORMIN HCL ER 500 MG PO TB24: 500.0000 mg | ORAL_TABLET | Freq: Two times a day (BID) | ORAL | 0 refills | Status: DC

## 2024-01-03 MED ORDER — AMPHETAMINE-DEXTROAMPHETAMINE 10 MG PO TABS: 10.0000 mg | ORAL_TABLET | Freq: Every day | ORAL | 0 refills | Status: DC | PRN

## 2024-01-05 ENCOUNTER — Encounter: Payer: Self-pay | Admitting: Family Medicine

## 2024-01-05 ENCOUNTER — Ambulatory Visit: Payer: Medicaid Other | Admitting: Family Medicine

## 2024-01-05 VITALS — BP 110/68 | HR 80 | Resp 16 | Ht 65.0 in | Wt 199.0 lb

## 2024-01-05 DIAGNOSIS — Z0000 Encounter for general adult medical examination without abnormal findings: Secondary | ICD-10-CM

## 2024-01-05 DIAGNOSIS — Z6834 Body mass index (BMI) 34.0-34.9, adult: Secondary | ICD-10-CM

## 2024-01-05 DIAGNOSIS — Z0001 Encounter for general adult medical examination with abnormal findings: Secondary | ICD-10-CM | POA: Diagnosis not present

## 2024-01-05 DIAGNOSIS — F902 Attention-deficit hyperactivity disorder, combined type: Secondary | ICD-10-CM

## 2024-01-05 DIAGNOSIS — K219 Gastro-esophageal reflux disease without esophagitis: Secondary | ICD-10-CM

## 2024-01-05 DIAGNOSIS — E1169 Type 2 diabetes mellitus with other specified complication: Secondary | ICD-10-CM | POA: Diagnosis not present

## 2024-01-05 DIAGNOSIS — E6609 Other obesity due to excess calories: Secondary | ICD-10-CM

## 2024-01-05 DIAGNOSIS — E66811 Obesity, class 1: Secondary | ICD-10-CM

## 2024-01-05 DIAGNOSIS — F411 Generalized anxiety disorder: Secondary | ICD-10-CM | POA: Insufficient documentation

## 2024-01-05 DIAGNOSIS — Z7984 Long term (current) use of oral hypoglycemic drugs: Secondary | ICD-10-CM

## 2024-01-05 DIAGNOSIS — E785 Hyperlipidemia, unspecified: Secondary | ICD-10-CM

## 2024-01-05 MED ORDER — GLIPIZIDE ER 2.5 MG PO TB24: 2.5000 mg | ORAL_TABLET | Freq: Every day | ORAL | 1 refills | Status: DC

## 2024-01-05 NOTE — Assessment & Plan Note (Signed)
 Followed by Endo, continue Metformin  and Glipizide . A1c 7.0% and uACR UTD. Foot exam today. Vaccines UTD. Retinal eye exam UTD. Recommend heart healthy diet such as Mediterranean diet with whole grains, fruits, vegetable, fish, lean meats, nuts, and olive oil. Limit salt. Encouraged moderate walking, 3-5 times/week for 30-50 minutes each session. Aim for at least 150 minutes.week. Goal should be pace of 3 miles/hours, or walking 1.5 miles in 30 minutes. Seek medical care for urinary frequency, extreme thirst, vision changes, lightheadedness, dizziness.

## 2024-01-05 NOTE — Assessment & Plan Note (Signed)
Discussed importance of optimal weight for overall health. Encouraged heart healthy, calorie restricted diet such as DASH or Mediterranean diet. She is reluctant to modify her diet due to her psychological history with food. Encouraged 150 minutes of moderate intensity exercise weekly.

## 2024-01-05 NOTE — Assessment & Plan Note (Signed)

## 2024-01-05 NOTE — Assessment & Plan Note (Signed)
 Well controlled on current regimen. She does see psychiatry and therapist. Continue Adderall XR 15mg  daily, Anafranil 25mg  daily, and Guanfacine 3mg  daily and follow up in 3 months via video visit. Encouraged adequate daily calorie intake, especially in early morning and in evening. Report changes in weight, appetite, or breakthrough symptoms.

## 2024-01-05 NOTE — Assessment & Plan Note (Signed)
 LDL at goal <70. Continue Rosuvastatin 5mg  daily. Your labs showed elevated cholesterol. I recommend consuming a heart healthy diet such as Mediterranean diet or DASH diet with whole grains, fruits, vegetable, fish, lean meats, nuts, and olive oil. Limit sweets and processed foods. I also encourage moderate intensity exercise 150 minutes weekly. This is 3-5 times weekly for 30-50 minutes each session. Goal should be pace of 3 miles/hours, or walking 1.5 miles in 30 minutes.

## 2024-01-05 NOTE — Progress Notes (Signed)
 Complete physical exam  Patient: Nicole [name]   DOB: 1980-09-18   43 y.o. Female  MRN: [id number]  Subjective:    Chief Complaint  Patient presents with   Annual Exam    Nicole [name] is a 43 y.o. female who presents today for a complete physical exam. She reports consuming a general diet. The patient does not participate in regular exercise at present. She generally feels well. She reports sleeping well. She does not have additional problems to discuss today.   HLD: LDL <70, Crestor 5mg  daily  DM: A1c 7.0%, sees Dr Kathyanne Parkers Endo, Metformin  XR 500mg  daily, glipizide  2.5mg  daily  Hypothyroidism: TSH 1.77, sees Dr Kathyanne Parkers Endo, taking Synthroid  50mcg 5 days weekly and 100mcg 2 days weekly  GERD: well controlled on Protonix  40mg  daily  Most recent fall risk assessment:    01/05/2024    2:06 PM  Fall Risk   Falls in the past year? 0  Number falls in past yr: 0  Injury with Fall? 0  Risk for fall due to : No Fall Risks  Follow up Falls evaluation completed     Most recent depression screenings:    01/05/2024    2:07 PM 10/06/2023    2:18 PM  PHQ 2/9 Scores  PHQ - 2 Score 0 0  PHQ- 9 Score 3 1    Vision:Within last year and Dental: No current dental problems and Receives regular dental care  Patient Active Problem List   Diagnosis Date Noted   GAD (generalized anxiety disorder) 01/05/2024   Hyperlipidemia associated with type 2 diabetes mellitus (HCC) 10/06/2023   Bilateral elbow joint pain 04/21/2023   Bilateral carpal tunnel syndrome 04/21/2023   Elbow tendinitis 03/24/2023   GERD (gastroesophageal reflux disease) 01/06/2023   Attention deficit hyperactivity disorder (ADHD), combined type 10/28/2022   Physical exam, annual 08/12/2022   Epitrochlear lymphadenopathy 08/12/2022   Diabetes mellitus (HCC) 03/23/2021   Statin declined 03/17/2021   Palpitations 11/19/2019   Vitamin D  deficiency 11/08/2019   Class 1 obesity with serious comorbidity and body mass index (BMI) of  34.0 to 34.9 in adult 09/29/2019   Other hyperlipidemia 09/27/2019   Rectocele 03/22/2019   Moody 08/21/2018   Fatigue 08/21/2018   Depression 08/21/2018   Screening cholesterol level 08/21/2018   Fetal macrosomia in pregnancy in third trimester 06/05/2013   Hypothyroidism 11/15/2012   Past Medical History:  Diagnosis Date   Anxiety    Back pain    Depression    Diabetes mellitus without complication (HCC)    GERD (gastroesophageal reflux disease)    Hypothyroidism    Palpitations    Pre-diabetes    Vitamin D  deficiency    Past Surgical History:  Procedure Laterality Date   BIOPSY  03/11/2023   Procedure: BIOPSY;  Surgeon: Urban Garden, MD;  Location: [facility];  Service: Gastroenterology;;   ESOPHAGOGASTRODUODENOSCOPY (EGD) WITH PROPOFOL  N/A 03/11/2023   Procedure: ESOPHAGOGASTRODUODENOSCOPY (EGD) WITH PROPOFOL ;  Surgeon: Urban Garden, MD;  Location: [facility];  Service: Gastroenterology;  Laterality: N/A;  7:30AM;ASA 1-2   MOUTH SURGERY     Social History   Tobacco Use   Smoking status: Never   Smokeless tobacco: Never  Vaping Use   Vaping status: Never Used  Substance Use Topics   Alcohol use: No   Drug use: No   Family History  Problem Relation Age of Onset   Thyroid  disease Mother    Depression Mother    Hypertension Father  Depression Father    GER disease Sister    Breast cancer Maternal Aunt    Hypertension Maternal Grandmother    Depression Maternal Grandmother    Depression Maternal Grandfather    Hypertension Paternal Grandmother    Anxiety disorder Paternal Grandmother    Macular degeneration Paternal Grandmother    Diabetes Paternal Grandfather    Allergies  Allergen Reactions   Wellbutrin  [Bupropion ] Tinitus      Patient Care Team: Jenelle Mis, FNP as PCP - General (Family Medicine)   Outpatient Medications Prior to Visit  Medication Sig   JARDIANCE 10 MG TABS tablet Take 10 mg by mouth daily.    rosuvastatin (CRESTOR) 5 MG tablet Take 5 mg by mouth daily.   amphetamine -dextroamphetamine  (ADDERALL XR) 15 MG 24 hr capsule Take 1 capsule by mouth every morning.   amphetamine -dextroamphetamine  (ADDERALL) 10 MG tablet Take 1 tablet (10 mg total) by mouth daily as needed (uncontrolled ADHD).   Barberry-Oreg Grape-Goldenseal (BERBERINE COMPLEX PO) Take 1 capsule by mouth 2 (two) times daily.   cetirizine (ZYRTEC) 10 MG tablet Take 10 mg by mouth at bedtime.   Cholecalciferol (VITAMIN D ) 125 MCG (5000 UT) CAPS Take 1 capsule by mouth daily.   CHOLINE BITARTRATE PO Take 250 mg by mouth daily.   clomiPRAMINE  (ANAFRANIL ) 25 MG capsule Take 1 capsule (25 mg total) by mouth at bedtime.   GuanFACINE  HCl 3 MG TB24 TAKE 1 TABLET BY MOUTH EVERY DAY IN THE EVENING   hydrOXYzine  (ATARAX ) 10 MG tablet Take 10 mg by mouth 3 (three) times daily as needed for anxiety.   Inositol Niacinate 250 MG TABS Take 250 mg by mouth daily.   levothyroxine  (SYNTHROID ) 50 MCG tablet Take 1-2 tablets (50-100 mcg total) by mouth daily before breakfast. 100 mcg on Sun and Wed morning, 50 mcg all other days   magnesium  gluconate (MAGONATE) 500 MG tablet Take 500 mg by mouth daily.   metFORMIN  (GLUCOPHAGE -XR) 500 MG 24 hr tablet Take 1 tablet (500 mg total) by mouth 2 (two) times daily with a meal.   Misc Natural Products (FOCUSED MIND PO) Take 2 capsules by mouth daily. True Focus   Multiple Vitamin (MULTIVITAMIN) tablet Take 1 tablet by mouth daily.   Omega-3 Fatty Acids (FISH OIL) 1000 MG CAPS Take 200 mg by mouth daily.   pantoprazole  (PROTONIX ) 40 MG tablet TAKE 1 TABLET BY MOUTH TWICE A DAY   Probiotic Product (PROBIOTIC DAILY PO) Take 1 capsule by mouth daily. 25 Billion   [DISCONTINUED] glipiZIDE  (GLUCOTROL  XL) 2.5 MG 24 hr tablet Take 1 tablet (2.5 mg total) by mouth daily with breakfast. (Patient not taking: Reported on 10/06/2023)   [DISCONTINUED] ibuprofen  (ADVIL ) 200 MG tablet Take 600 mg by mouth every 8 (eight)  hours as needed for moderate pain.   [DISCONTINUED] meloxicam  (MOBIC ) 7.5 MG tablet TAKE 1 TABLET BY MOUTH EVERY DAY   [DISCONTINUED] metFORMIN  (GLUCOPHAGE -XR) 500 MG 24 hr tablet Take 1 tablet (500 mg total) by mouth 2 (two) times daily with a meal.   No facility-administered medications prior to visit.    Review of Systems  Constitutional: Negative.   HENT: Negative.    Eyes: Negative.   Respiratory: Negative.    Cardiovascular: Negative.   Gastrointestinal: Negative.   Genitourinary: Negative.   Musculoskeletal: Negative.   Skin: Negative.   Neurological: Negative.   Endo/Heme/Allergies: Negative.   Psychiatric/Behavioral: Negative.    All other systems reviewed and are negative.  Objective:     BP 110/68   Pulse 80   Resp 16   Ht 5\' 5"  (1.651 m)   Wt 199 lb (90.3 kg)   SpO2 99%   BMI 33.12 kg/m  BP Readings from Last 3 Encounters:  01/05/24 110/68  10/06/23 118/74  04/26/23 129/85   Wt Readings from Last 3 Encounters:  01/05/24 199 lb (90.3 kg)  10/06/23 209 lb (94.8 kg)  04/26/23 206 lb 12.8 oz (93.8 kg)      Physical Exam Vitals and nursing note reviewed.  Constitutional:      Appearance: Normal appearance. She is obese.  HENT:     Head: Normocephalic and atraumatic.     Right Ear: Tympanic membrane, ear canal and external ear normal.     Left Ear: Tympanic membrane, ear canal and external ear normal.     Nose: Nose normal.     Mouth/Throat:     Mouth: Mucous membranes are moist.     Pharynx: Oropharynx is clear.  Eyes:     Extraocular Movements: Extraocular movements intact.     Conjunctiva/sclera: Conjunctivae normal.     Pupils: Pupils are equal, round, and reactive to light.  Cardiovascular:     Rate and Rhythm: Normal rate and regular rhythm.     Pulses: Normal pulses.     Heart sounds: Normal heart sounds.  Pulmonary:     Effort: Pulmonary effort is normal.     Breath sounds: Normal breath sounds.  Abdominal:     General:  Bowel sounds are normal.     Palpations: Abdomen is soft.  Musculoskeletal:        General: Normal range of motion.     Cervical back: Normal range of motion and neck supple.  Skin:    General: Skin is warm and dry.     Capillary Refill: Capillary refill takes less than 2 seconds.  Neurological:     General: No focal deficit present.     Mental Status: She is alert and oriented to person, place, and time. Mental status is at baseline.  Psychiatric:        Mood and Affect: Mood normal.        Behavior: Behavior normal.        Thought Content: Thought content normal.        Judgment: Judgment normal.      No results found for any visits on 01/05/24. Last CBC Lab Results  Component Value Date   WBC 6.9 12/29/2023   HGB 12.7 12/29/2023   HCT 39.4 12/29/2023   MCV 84.4 12/29/2023   MCH 27.2 12/29/2023   RDW 12.7 12/29/2023   PLT 453 (H) 12/29/2023   Last metabolic panel Lab Results  Component Value Date   GLUCOSE 149 (H) 12/29/2023   NA 138 12/29/2023   K 4.1 12/29/2023   CL 101 12/29/2023   CO2 30 12/29/2023   BUN 14 12/29/2023   CREATININE 0.57 12/29/2023   EGFR 116 09/29/2023   CALCIUM 9.6 12/29/2023   PROT 7.1 12/29/2023   ALBUMIN 4.7 07/27/2021   LABGLOB 2.5 07/27/2021   AGRATIO 1.9 07/27/2021   BILITOT 0.4 12/29/2023   ALKPHOS 70 07/27/2021   AST 13 12/29/2023   ALT 14 12/29/2023   Last lipids Lab Results  Component Value Date   CHOL 128 12/29/2023   HDL 47 (L) 12/29/2023   LDLCALC 61 12/29/2023   TRIG 110 12/29/2023   CHOLHDL 2.7 12/29/2023   Last hemoglobin A1c Lab  Results  Component Value Date   HGBA1C 7.0 (H) 12/29/2023   Last thyroid  functions Lab Results  Component Value Date   TSH 1.77 12/29/2023   T3TOTAL 114 07/28/2020   Last vitamin D  Lab Results  Component Value Date   VD25OH 60 08/11/2022   Last vitamin B12 and Folate Lab Results  Component Value Date   VITAMINB12 449 08/11/2022        Assessment & Plan:    Routine  Health Maintenance and Physical Exam  Immunization History  Administered Date(s) Administered   Influenza Inj Mdck Quad Pf 07/16/2016   Influenza,inj,Quad PF,6+ Mos 05/29/2013, 07/10/2019, 08/12/2022   Influenza-Unspecified 07/03/2015, 07/16/2016, 06/13/2018, 06/13/2023   Moderna Sars-Covid-2 Vaccination 11/22/2019, 12/22/2019   Tdap 06/21/2013    Health Maintenance  Topic Date Due   Hepatitis C Screening  Never done   Pneumococcal Vaccine 57-32 Years old (1 of 2 - PCV) Never done   COVID-19 Vaccine (3 - Moderna risk series) 01/19/2020   DTaP/Tdap/Td (2 - Td or Tdap) 06/22/2023   FOOT EXAM  02/15/2024   INFLUENZA VACCINE  04/13/2024   OPHTHALMOLOGY EXAM  05/10/2024   HEMOGLOBIN A1C  06/29/2024   Diabetic kidney evaluation - Urine ACR  10/05/2024   Diabetic kidney evaluation - eGFR measurement  12/28/2024   Cervical Cancer Screening (HPV/Pap Cotest)  12/22/2026   HIV Screening  Completed   HPV VACCINES  Aged Out   Meningococcal B Vaccine  Aged Out    Discussed health benefits of physical activity, and encouraged her to engage in regular exercise appropriate for her age and condition.  Problem List Items Addressed This Visit     Class 1 obesity with serious comorbidity and body mass index (BMI) of 34.0 to 34.9 in adult   Discussed importance of optimal weight for overall health. Encouraged heart healthy, calorie restricted diet such as DASH or Mediterranean diet. She is reluctant to modify her diet due to her psychological history with food. Encouraged 150 minutes of moderate intensity exercise weekly.      Relevant Medications   JARDIANCE 10 MG TABS tablet   glipiZIDE  (GLUCOTROL  XL) 2.5 MG 24 hr tablet   Diabetes mellitus (HCC)   Followed by Endo, continue Metformin  and Glipizide . A1c 7.0% and uACR UTD. Foot exam today. Vaccines UTD. Retinal eye exam UTD. Recommend heart healthy diet such as Mediterranean diet with whole grains, fruits, vegetable, fish, lean meats, nuts, and  olive oil. Limit salt. Encouraged moderate walking, 3-5 times/week for 30-50 minutes each session. Aim for at least 150 minutes.week. Goal should be pace of 3 miles/hours, or walking 1.5 miles in 30 minutes. Seek medical care for urinary frequency, extreme thirst, vision changes, lightheadedness, dizziness.        Relevant Medications   JARDIANCE 10 MG TABS tablet   rosuvastatin (CRESTOR) 5 MG tablet   glipiZIDE  (GLUCOTROL  XL) 2.5 MG 24 hr tablet   Physical exam, annual - Primary   Today your medical history was reviewed and routine physical exam with labs was performed. Recommend 150 minutes of moderate intensity exercise weekly and consuming a well-balanced diet. Advised to stop smoking if a smoker, avoid smoking if a non-smoker, limit alcohol consumption to 1 drink per day for women and 2 drinks per day for men, and avoid illicit drug use. Counseled on safe sex practices and offered STI testing today. Counseled on the importance of sunscreen use. Counseled in mental health awareness and when to seek medical care. Vaccine maintenance discussed. Appropriate health maintenance  items reviewed. Return to office in 1 year for annual physical exam.       Attention deficit hyperactivity disorder (ADHD), combined type   Well controlled on current regimen. She does see psychiatry and therapist. Continue Adderall XR 15mg  daily, Anafranil  25mg  daily, and Guanfacine  3mg  daily and follow up in 3 months via video visit. Encouraged adequate daily calorie intake, especially in early morning and in evening. Report changes in weight, appetite, or breakthrough symptoms.       GERD (gastroesophageal reflux disease)   Well controlled on Protonix  40mg  daily. Followed by GI. Anti-reflux measures such as raising the head of the bed, avoiding tight clothing or belts, avoiding eating late at night and not lying down shortly after mealtime and achieving weight loss encouraged. Avoid ASA, NSAID's, caffeine, peppermints,  alcohol and tobacco. OTC H2 blockers and/or antacids are often very helpful for PRN use. Alert me if there are persistent symptoms, dysphagia, weight loss or GI bleeding.       Hyperlipidemia associated with type 2 diabetes mellitus (HCC)   LDL at goal <70. Continue Rosuvastatin 5mg  daily. Your labs showed elevated cholesterol. I recommend consuming a heart healthy diet such as Mediterranean diet or DASH diet with whole grains, fruits, vegetable, fish, lean meats, nuts, and olive oil. Limit sweets and processed foods. I also encourage moderate intensity exercise 150 minutes weekly. This is 3-5 times weekly for 30-50 minutes each session. Goal should be pace of 3 miles/hours, or walking 1.5 miles in 30 minutes.      Relevant Medications   JARDIANCE 10 MG TABS tablet   rosuvastatin (CRESTOR) 5 MG tablet   glipiZIDE  (GLUCOTROL  XL) 2.5 MG 24 hr tablet   Return in about 3 months (around 04/05/2024) for ADHD.     Jenelle Mis, FNP

## 2024-01-05 NOTE — Assessment & Plan Note (Signed)
 Well controlled on Protonix  40mg  daily. Followed by GI. Anti-reflux measures such as raising the head of the bed, avoiding tight clothing or belts, avoiding eating late at night and not lying down shortly after mealtime and achieving weight loss encouraged. Avoid ASA, NSAID's, caffeine, peppermints, alcohol and tobacco. OTC H2 blockers and/or antacids are often very helpful for PRN use. Alert me if there are persistent symptoms, dysphagia, weight loss or GI bleeding.

## 2024-01-20 ENCOUNTER — Other Ambulatory Visit: Payer: Self-pay | Admitting: Family Medicine

## 2024-01-23 ENCOUNTER — Encounter: Payer: Self-pay | Admitting: Family Medicine

## 2024-01-23 ENCOUNTER — Other Ambulatory Visit: Payer: Self-pay | Admitting: Family Medicine

## 2024-01-23 MED ORDER — GUANFACINE HCL ER 3 MG PO TB24: 3.0000 mg | ORAL_TABLET | Freq: Every evening | ORAL | 0 refills | Status: DC

## 2024-02-04 ENCOUNTER — Encounter: Payer: Self-pay | Admitting: Family Medicine

## 2024-02-07 ENCOUNTER — Other Ambulatory Visit: Payer: Self-pay | Admitting: Family Medicine

## 2024-02-07 DIAGNOSIS — Z1231 Encounter for screening mammogram for malignant neoplasm of breast: Secondary | ICD-10-CM

## 2024-02-07 LAB — HM DIABETES EYE EXAM

## 2024-02-07 MED ORDER — AMPHETAMINE-DEXTROAMPHET ER 15 MG PO CP24: 15.0000 mg | ORAL_CAPSULE | ORAL | 0 refills | Status: DC

## 2024-02-24 LAB — HM DIABETES EYE EXAM

## 2024-02-25 ENCOUNTER — Other Ambulatory Visit: Payer: Self-pay | Admitting: Family Medicine

## 2024-02-25 DIAGNOSIS — F32A Depression, unspecified: Secondary | ICD-10-CM

## 2024-02-28 ENCOUNTER — Encounter: Payer: Self-pay | Admitting: Family Medicine

## 2024-02-28 ENCOUNTER — Encounter

## 2024-02-28 ENCOUNTER — Other Ambulatory Visit: Payer: Self-pay | Admitting: Family Medicine

## 2024-02-28 DIAGNOSIS — Z1231 Encounter for screening mammogram for malignant neoplasm of breast: Secondary | ICD-10-CM

## 2024-02-28 MED ORDER — AMPHETAMINE-DEXTROAMPHETAMINE 10 MG PO TABS: 10.0000 mg | ORAL_TABLET | Freq: Every day | ORAL | 0 refills | Status: DC | PRN

## 2024-02-29 ENCOUNTER — Other Ambulatory Visit: Payer: Self-pay | Admitting: Family Medicine

## 2024-02-29 DIAGNOSIS — Z1231 Encounter for screening mammogram for malignant neoplasm of breast: Secondary | ICD-10-CM

## 2024-03-02 ENCOUNTER — Ambulatory Visit
Admission: RE | Admit: 2024-03-02 | Discharge: 2024-03-02 | Disposition: A | Discharge status: Home / Self Care | Source: Ambulatory Visit

## 2024-03-02 DIAGNOSIS — Z1231 Encounter for screening mammogram for malignant neoplasm of breast: Secondary | ICD-10-CM

## 2024-03-06 ENCOUNTER — Encounter: Payer: Self-pay | Admitting: Family Medicine

## 2024-03-06 ENCOUNTER — Ambulatory Visit: Admitting: Family Medicine

## 2024-03-06 VITALS — BP 102/68 | HR 94 | Temp 98.6°F | Resp 18 | Ht 65.0 in | Wt 200.4 lb

## 2024-03-06 DIAGNOSIS — E1165 Type 2 diabetes mellitus with hyperglycemia: Secondary | ICD-10-CM | POA: Diagnosis not present

## 2024-03-06 DIAGNOSIS — T63441A Toxic effect of venom of bees, accidental (unintentional), initial encounter: Secondary | ICD-10-CM

## 2024-03-06 MED ORDER — PREDNISONE 10 MG (21) PO TBPK: ORAL_TABLET | ORAL | 0 refills | Status: DC

## 2024-03-06 NOTE — Progress Notes (Signed)
 Patient Office Visit  Assessment & Plan:  Bee sting, accidental or unintentional, initial encounter -     predniSONE ; Use as directed.  Dispense: 21 each; Refill: 0  Type 2 diabetes mellitus with hyperglycemia, without long-term current use of insulin  (HCC)   Assessment and Plan    Bee sting reaction Localized reaction on right wrist and thumb with swelling and redness. No systemic allergic reaction or infection. Increased risk due to diabetes. - Prescribed 6-day prednisone  taper. - Advised taking prednisone  in the morning with food. - Recommended acetaminophen  for pain, avoid ibuprofen . - Suggested diphenhydramine  or loratadine for itchiness, diphenhydramine  at night. - Instructed to monitor for infection signs or worsening redness and report if present.  Type 2 Diabetes mellitus Diabetes with recent A1c of 7. Increased infection risk, especially with skin injuries. - Monitor for infection signs at bee sting site.  Hypothyroidism No specific issues discussed.  Follow-up Instructed to report worsening symptoms or infection signs. - Advise to contact clinic if redness worsens or streaking occurs.          No follow-ups on file.   Subjective:    Patient ID: Nicole [name], female    DOB: 06/19/1981  Age: 43 y.o. MRN: [id number]  Chief Complaint  Patient presents with   Insect Bite    Right Hand    HPI Discussed the use of AI scribe software for clinical note transcription with the patient, who gave verbal consent to proceed.  History of Present Illness   Nicole [name] is a 43 year old female who presents with a bee sting on her right wrist and thumb area.  She was stung by a bee yesterday on her right wrist and thumb area. Initially, the pain was severe, reaching ten out of ten, and she took extra strength Tylenol , which provided minimal relief. By this morning, the pain decreased to one and a half out of ten, but the area became very itchy, with itchiness extending  from the wrist to the thumb.  Swelling initially subsided last night but increased again this morning. Redness was absent last night but reappeared this morning, prompting her to seek medical attention. No breathing problems, throat closing, or lip swelling have occurred. She has not been stung by bees since childhood. Patient is diabetic.   For treatment, she took 50 mg of Benadryl  immediately after the sting and another dose before bed, which helped with the symptoms. Ice application initially helped but later worsened the pain. She is right-handed, and washing her hands exacerbates the pain at the sting site.  Her past medical history includes diabetes and hypothyroidism. She denies any history of asthma or autoimmune disorders. She is currently taking medication for diabetes and cholesterol. Her husband and son have had similar reactions to bee stings in the past.     [name] is a 43 year old female who presents with a bee sting on her right wrist and thumb area.  She was stung by a bee yesterday on her right wrist and thumb area. Initially, the pain was severe, reaching ten out of ten, and she took extra strength Tylenol , which provided minimal relief. By this morning, the pain decreased to one and a half out of ten, but the area became very itchy, with itchiness extending from the wrist to the thumb.  Swelling initially subsided last night but increased again this morning. Redness was absent last night but reappeared this morning, prompting her to seek medical attention. No breathing problems, throat  closing, or lip swelling have occurred. She has not been stung by bees since childhood. Patient is diabetic.   For treatment, she took 50 mg of Benadryl  immediately after the sting and another dose before bed, which helped with the symptoms. Ice application initially helped but later worsened the pain. She is right-handed, and washing her hands exacerbates the pain at the sting site.  Her past  medical history includes diabetes and hypothyroidism. She denies any history of asthma or autoimmune disorders. She is currently taking medication for diabetes and cholesterol. Her husband and son have had similar reactions to bee stings in the past. Physical Exam EXTREMITIES: Redness and induration on the right wrist and thumb area. Results LABS A1c: 7% (12/2023) Assessment & Plan Bee sting reaction Localized reaction on right wrist and thumb with swelling and redness. No systemic allergic reaction or infection. Increased risk due to diabetes. - Prescribed 6-day prednisone  taper. - Advised taking prednisone  in the morning with food. - Recommended acetaminophen  for pain, avoid ibuprofen . - Suggested diphenhydramine  or loratadine for itchiness, diphenhydramine  at night. - Instructed to monitor for infection signs or worsening redness and report if present.  Type 2 Diabetes mellitus Diabetes with recent A1c of 7. Increased infection risk, especially with skin injuries. - Monitor for infection signs at bee sting site.  Hypothyroidism No specific issues discussed.  Follow-up Instructed to report worsening symptoms or infection signs. - Advise to contact clinic if redness worsens or streaking occurs.    The ASCVD Risk score (Arnett DK, et al., 2019) failed to calculate for the following reasons:   The valid total cholesterol range is 130 to 320 mg/dL  Past Medical History:  Diagnosis Date   Anxiety    Back pain    Depression    Diabetes mellitus without complication (HCC)    GERD (gastroesophageal reflux disease)    Hypothyroidism    Palpitations    Pre-diabetes    Vitamin D  deficiency    Past Surgical History:  Procedure Laterality Date   BIOPSY  03/11/2023   Procedure: BIOPSY;  Surgeon: Eartha Angelia Sieving, MD;  Location: [facility];  Service: Gastroenterology;;   ESOPHAGOGASTRODUODENOSCOPY (EGD) WITH PROPOFOL  N/A 03/11/2023   Procedure: ESOPHAGOGASTRODUODENOSCOPY  (EGD) WITH PROPOFOL ;  Surgeon: Eartha Angelia Sieving, MD;  Location: [facility];  Service: Gastroenterology;  Laterality: N/A;  7:30AM;ASA 1-2   MOUTH SURGERY     Social History   Tobacco Use   Smoking status: Never   Smokeless tobacco: Never  Vaping Use   Vaping status: Never Used  Substance Use Topics   Alcohol use: No   Drug use: No   Family History  Problem Relation Age of Onset   Thyroid  disease Mother    Depression Mother    Hypertension Father    Depression Father    GER disease Sister    Breast cancer Maternal Aunt    Hypertension Maternal Grandmother    Depression Maternal Grandmother    Depression Maternal Grandfather    Hypertension Paternal Grandmother    Anxiety disorder Paternal Grandmother    Macular degeneration Paternal Grandmother    Diabetes Paternal Grandfather    Allergies  Allergen Reactions   Wellbutrin  [Bupropion ] Tinitus    ROS    Objective:    BP 102/68   Pulse 94   Temp 98.6 F (37 C) (Temporal)   Resp 18   Ht 5' 5 (1.651 m)   Wt 200 lb 6.4 oz (90.9 kg)   LMP 02/26/2024  SpO2 97%   BMI 33.35 kg/m  BP Readings from Last 3 Encounters:  03/06/24 102/68  01/05/24 110/68  10/06/23 118/74   Wt Readings from Last 3 Encounters:  03/06/24 200 lb 6.4 oz (90.9 kg)  01/05/24 199 lb (90.3 kg)  10/06/23 209 lb (94.8 kg)    Physical Exam Vitals and nursing note reviewed.  Constitutional:      Appearance: Normal appearance.  HENT:     Head: Normocephalic.     Right Ear: Tympanic membrane, ear canal and external ear normal.     Left Ear: Tympanic membrane, ear canal and external ear normal.   Eyes:     Extraocular Movements: Extraocular movements intact.     Pupils: Pupils are equal, round, and reactive to light.    Cardiovascular:     Rate and Rhythm: Regular rhythm. Tachycardia present.     Heart sounds: Normal heart sounds.  Pulmonary:     Effort: Pulmonary effort is normal. No respiratory distress.     Breath  sounds: Normal breath sounds. No wheezing.   Musculoskeletal:     Right lower leg: No edema.     Left lower leg: No edema.   Skin:    Findings: Erythema and rash present.     Comments: Erythema over right thumb and wrist area, slightly indurated. No streaking noted. Patient can grip my hand.    Neurological:     General: No focal deficit present.     Mental Status: She is alert and oriented to person, place, and time.   Psychiatric:        Mood and Affect: Mood normal.        Behavior: Behavior normal.      No results found for any visits on 03/06/24.

## 2024-03-19 ENCOUNTER — Encounter: Payer: Self-pay | Admitting: Family Medicine

## 2024-03-19 ENCOUNTER — Other Ambulatory Visit: Payer: Self-pay | Admitting: Family Medicine

## 2024-03-19 MED ORDER — AMPHETAMINE-DEXTROAMPHET ER 15 MG PO CP24: 15.0000 mg | ORAL_CAPSULE | ORAL | 0 refills | Status: DC

## 2024-03-21 ENCOUNTER — Encounter: Payer: Self-pay | Admitting: Family Medicine

## 2024-03-21 ENCOUNTER — Ambulatory Visit: Admitting: Family Medicine

## 2024-03-21 VITALS — BP 118/80 | HR 107 | Temp 98.8°F | Ht 65.0 in | Wt 198.4 lb

## 2024-03-21 DIAGNOSIS — B9689 Other specified bacterial agents as the cause of diseases classified elsewhere: Secondary | ICD-10-CM

## 2024-03-21 DIAGNOSIS — N760 Acute vaginitis: Secondary | ICD-10-CM | POA: Diagnosis not present

## 2024-03-21 DIAGNOSIS — B3731 Acute candidiasis of vulva and vagina: Secondary | ICD-10-CM

## 2024-03-21 DIAGNOSIS — N898 Other specified noninflammatory disorders of vagina: Secondary | ICD-10-CM

## 2024-03-21 LAB — WET PREP FOR TRICH, YEAST, CLUE

## 2024-03-21 MED ORDER — FLUCONAZOLE 150 MG PO TABS: 150.0000 mg | ORAL_TABLET | Freq: Once | ORAL | 0 refills | Status: AC

## 2024-03-21 NOTE — Assessment & Plan Note (Signed)
 Wet prep positive for clue, bacteria, WBC with positive whiff. Start Fluconazole  150mg  once, may repeat in 72 hours if ineffective. If recurrent return to office to discuss Jardiance.

## 2024-03-21 NOTE — Progress Notes (Signed)
 Subjective:  HPI: Nicole [name] is a 43 y.o. female presenting on 03/21/2024 for Acute Visit (Yeast infection x 5 days /Itching burning discharge /)   HPI Patient is in today for vaginal itching, burning, thick white discharge for 5 days. Is taking Jardiance. Has had recurrence of yeast infections in recent past. Has tried nothing.   Review of Systems  All other systems reviewed and are negative.   Relevant past medical history reviewed and updated as indicated.   Past Medical History:  Diagnosis Date   Anxiety    Back pain    Depression    Diabetes mellitus without complication (HCC)    GERD (gastroesophageal reflux disease)    Hypothyroidism    Palpitations    Pre-diabetes    Vitamin D  deficiency      Past Surgical History:  Procedure Laterality Date   BIOPSY  03/11/2023   Procedure: BIOPSY;  Surgeon: Eartha Angelia Sieving, MD;  Location: [facility];  Service: Gastroenterology;;   ESOPHAGOGASTRODUODENOSCOPY (EGD) WITH PROPOFOL  N/A 03/11/2023   Procedure: ESOPHAGOGASTRODUODENOSCOPY (EGD) WITH PROPOFOL ;  Surgeon: Eartha Angelia Sieving, MD;  Location: [facility];  Service: Gastroenterology;  Laterality: N/A;  7:30AM;ASA 1-2   MOUTH SURGERY      Allergies and medications reviewed and updated.   Current Outpatient Medications:    ACCU-CHEK GUIDE TEST test strip, as directed., Disp: , Rfl:    amphetamine -dextroamphetamine  (ADDERALL XR) 15 MG 24 hr capsule, Take 1 capsule by mouth every morning., Disp: 30 capsule, Rfl: 0   amphetamine -dextroamphetamine  (ADDERALL) 10 MG tablet, Take 1 tablet (10 mg total) by mouth daily as needed (uncontrolled ADHD)., Disp: 30 tablet, Rfl: 0   Barberry-Oreg Grape-Goldenseal (BERBERINE COMPLEX PO), Take 1 capsule by mouth 2 (two) times daily., Disp: , Rfl:    cetirizine (ZYRTEC) 10 MG tablet, Take 10 mg by mouth at bedtime., Disp: , Rfl:    Cholecalciferol (VITAMIN D ) 125 MCG (5000 UT) CAPS, Take 1 capsule by mouth daily., Disp: 30  capsule, Rfl: 0   CHOLINE BITARTRATE PO, Take 250 mg by mouth daily., Disp: , Rfl:    clomiPRAMINE  (ANAFRANIL ) 25 MG capsule, TAKE 1 CAPSULE BY MOUTH AT BEDTIME., Disp: 90 capsule, Rfl: 1   glipiZIDE  (GLUCOTROL  XL) 2.5 MG 24 hr tablet, Take 1 tablet (2.5 mg total) by mouth daily with breakfast., Disp: 90 tablet, Rfl: 1   GuanFACINE  HCl 3 MG TB24, Take 1 tablet (3 mg total) by mouth every evening., Disp: 90 tablet, Rfl: 0   hydrOXYzine  (ATARAX ) 10 MG tablet, Take 10 mg by mouth 3 (three) times daily as needed for anxiety., Disp: , Rfl:    Inositol Niacinate 250 MG TABS, Take 250 mg by mouth daily., Disp: , Rfl:    JARDIANCE 10 MG TABS tablet, Take 10 mg by mouth daily., Disp: , Rfl:    levothyroxine  (SYNTHROID ) 50 MCG tablet, Take 1-2 tablets (50-100 mcg total) by mouth daily before breakfast. 100 mcg on Sun and Wed morning, 50 mcg all other days, Disp: 60 tablet, Rfl: 0   magnesium  gluconate (MAGONATE) 500 MG tablet, Take 500 mg by mouth daily., Disp: , Rfl:    metFORMIN  (GLUCOPHAGE -XR) 500 MG 24 hr tablet, Take 1 tablet (500 mg total) by mouth 2 (two) times daily with a meal., Disp: 90 tablet, Rfl: 0   Misc Natural Products (FOCUSED MIND PO), Take 2 capsules by mouth daily. True Focus, Disp: , Rfl:    Multiple Vitamin (MULTIVITAMIN) tablet, Take 1 tablet by mouth daily.,  Disp: , Rfl:    Omega-3 Fatty Acids (FISH OIL) 1000 MG CAPS, Take 200 mg by mouth daily., Disp: , Rfl:    pantoprazole  (PROTONIX ) 40 MG tablet, TAKE 1 TABLET BY MOUTH TWICE A DAY, Disp: 180 tablet, Rfl: 1   Probiotic Product (PROBIOTIC DAILY PO), Take 1 capsule by mouth daily. 25 Billion, Disp: , Rfl:    rosuvastatin (CRESTOR) 5 MG tablet, Take 5 mg by mouth daily., Disp: , Rfl:    metroNIDAZOLE  (FLAGYL ) 500 MG tablet, Take 1 tablet (500 mg total) by mouth 2 (two) times daily for 7 days., Disp: 14 tablet, Rfl: 0   predniSONE  (STERAPRED UNI-PAK 21 TAB) 10 MG (21) TBPK tablet, Use as directed. (Patient not taking: Reported on  03/21/2024), Disp: 21 each, Rfl: 0  Allergies  Allergen Reactions   Wellbutrin  [Bupropion ] Tinitus    Objective:   BP 118/80   Pulse (!) 107   Temp 98.8 F (37.1 C)   Ht 5' 5 (1.651 m)   Wt 198 lb 6.4 oz (90 kg)   LMP 02/26/2024   SpO2 98%   BMI 33.02 kg/m      03/21/2024    4:01 PM 03/06/2024   12:07 PM 01/05/2024    2:03 PM  Vitals with BMI  Height 5' 5 5' 5 5' 5  Weight 198 lbs 6 oz 200 lbs 6 oz 199 lbs  BMI 33.02 33.35 33.12  Systolic 118 102 889  Diastolic 80 68 68  Pulse 107 94 80     Physical Exam Vitals and nursing note reviewed.  Constitutional:      Appearance: Normal appearance. She is normal weight.  HENT:     Head: Normocephalic and atraumatic.  Skin:    General: Skin is warm and dry.  Neurological:     General: No focal deficit present.     Mental Status: She is alert and oriented to person, place, and time. Mental status is at baseline.  Psychiatric:        Mood and Affect: Mood normal.        Behavior: Behavior normal.        Thought Content: Thought content normal.        Judgment: Judgment normal.     Assessment & Plan:  Bacterial vaginosis Assessment & Plan: Wet prep positive for clue, bacteria, WBC with positive whiff. Start Metronidazole  500mg  BID x7d.   Vaginal discharge -     WET PREP FOR TRICH, YEAST, CLUE  Other orders -     Fluconazole ; Take 1 tablet (150 mg total) by mouth once for 1 dose. May repeat in 72 hours if ineffective  Dispense: 3 tablet; Refill: 0     Follow up plan: Return if symptoms worsen or fail to improve.  Jeoffrey GORMAN Barrio, FNP

## 2024-03-22 ENCOUNTER — Encounter: Payer: Self-pay | Admitting: Family Medicine

## 2024-03-22 ENCOUNTER — Other Ambulatory Visit: Payer: Self-pay | Admitting: Family Medicine

## 2024-03-22 DIAGNOSIS — B9689 Other specified bacterial agents as the cause of diseases classified elsewhere: Secondary | ICD-10-CM | POA: Insufficient documentation

## 2024-03-22 MED ORDER — METRONIDAZOLE 500 MG PO TABS: 500.0000 mg | ORAL_TABLET | Freq: Two times a day (BID) | ORAL | 0 refills | Status: AC

## 2024-03-22 NOTE — Assessment & Plan Note (Signed)
 Wet prep positive for clue, bacteria, WBC with positive whiff. Start Metronidazole  500mg  BID x7d.

## 2024-04-03 ENCOUNTER — Encounter: Payer: Self-pay | Admitting: Family Medicine

## 2024-04-05 ENCOUNTER — Other Ambulatory Visit: Payer: Self-pay

## 2024-04-05 DIAGNOSIS — E1165 Type 2 diabetes mellitus with hyperglycemia: Secondary | ICD-10-CM

## 2024-04-06 ENCOUNTER — Other Ambulatory Visit: Payer: Self-pay

## 2024-04-06 DIAGNOSIS — E1165 Type 2 diabetes mellitus with hyperglycemia: Secondary | ICD-10-CM

## 2024-04-06 DIAGNOSIS — E66811 Obesity, class 1: Secondary | ICD-10-CM

## 2024-04-06 DIAGNOSIS — E1169 Type 2 diabetes mellitus with other specified complication: Secondary | ICD-10-CM

## 2024-04-06 NOTE — Telephone Encounter (Unsigned)
 Copied from CRM [id number]. Topic: Clinical - Request for Lab/Test Order >> Apr 06, 2024  8:11 AM Rosaria BRAVO wrote: Reason for CRM: Pt called to report that she is at [facility] lab and they do not have her orders for A1C, CBC, TSH, Complete Metabolic panel.   [facility] [place]  [address], [place], [place] [address]

## 2024-04-07 LAB — COMPREHENSIVE METABOLIC PANEL WITH GFR
AG Ratio: 1.6 (calc) (ref 1.0–2.5)
ALT: 23 U/L (ref 6–29)
AST: 17 U/L (ref 10–30)
Albumin: 4.4 g/dL (ref 3.6–5.1)
Alkaline phosphatase (APISO): 66 U/L (ref 31–125)
BUN: 13 mg/dL (ref 7–25)
CO2: 28 mmol/L (ref 20–32)
Calcium: 9.6 mg/dL (ref 8.6–10.2)
Chloride: 100 mmol/L (ref 98–110)
Creat: 0.54 mg/dL (ref 0.50–0.99)
Globulin: 2.7 g/dL (ref 1.9–3.7)
Glucose, Bld: 147 mg/dL — ABNORMAL HIGH (ref 65–99)
Potassium: 4.3 mmol/L (ref 3.5–5.3)
Sodium: 137 mmol/L (ref 135–146)
Total Bilirubin: 0.4 mg/dL (ref 0.2–1.2)
Total Protein: 7.1 g/dL (ref 6.1–8.1)
eGFR: 116 mL/min/1.73m2 (ref >=60)

## 2024-04-07 LAB — CBC WITH DIFFERENTIAL/PLATELET
Absolute Lymphocytes: 2842 {cells}/uL (ref 850–3900)
Absolute Monocytes: 770 {cells}/uL (ref 200–950)
Basophils Absolute: 37 {cells}/uL (ref 0–200)
Basophils Relative: 0.5 %
Eosinophils Absolute: 229 {cells}/uL (ref 15–500)
Eosinophils Relative: 3.1 %
HCT: 41.2 % (ref 35.0–45.0)
Hemoglobin: 12.3 g/dL (ref 11.7–15.5)
MCH: 24.3 pg — ABNORMAL LOW (ref 27.0–33.0)
MCHC: 29.9 g/dL — ABNORMAL LOW (ref 32.0–36.0)
MCV: 81.3 fL (ref 80.0–100.0)
MPV: 10.2 fL (ref 7.5–12.5)
Monocytes Relative: 10.4 %
Neutro Abs: 3522 {cells}/uL (ref 1500–7800)
Neutrophils Relative %: 47.6 %
Platelets: 442 Thousand/uL — ABNORMAL HIGH (ref 140–400)
RBC: 5.07 Million/uL (ref 3.80–5.10)
RDW: 14.2 % (ref 11.0–15.0)
Total Lymphocyte: 38.4 %
WBC: 7.4 Thousand/uL (ref 3.8–10.8)

## 2024-04-07 LAB — LIPID PANEL
Cholesterol: 148 mg/dL (ref <200)
HDL: 55 mg/dL (ref >=50)
LDL Cholesterol (Calc): 73 mg/dL
Non-HDL Cholesterol (Calc): 93 mg/dL (ref <130)
Total CHOL/HDL Ratio: 2.7 (calc) (ref <5.0)
Triglycerides: 118 mg/dL (ref <150)

## 2024-04-07 LAB — HEMOGLOBIN A1C
Hgb A1c MFr Bld: 7.5 % — ABNORMAL HIGH (ref <5.7)
Mean Plasma Glucose: 169 mg/dL
eAG (mmol/L): 9.3 mmol/L

## 2024-04-09 ENCOUNTER — Ambulatory Visit: Admitting: Family Medicine

## 2024-04-09 ENCOUNTER — Encounter: Payer: Self-pay | Admitting: Family Medicine

## 2024-04-09 VITALS — BP 106/70 | HR 73 | Temp 98.4°F | Ht 65.0 in | Wt 200.4 lb

## 2024-04-09 DIAGNOSIS — F902 Attention-deficit hyperactivity disorder, combined type: Secondary | ICD-10-CM | POA: Diagnosis not present

## 2024-04-09 DIAGNOSIS — D75839 Thrombocytosis, unspecified: Secondary | ICD-10-CM

## 2024-04-09 MED ORDER — HYDROXYZINE HCL 10 MG PO TABS: 10.0000 mg | ORAL_TABLET | Freq: Three times a day (TID) | ORAL | 1 refills | Status: AC | PRN

## 2024-04-09 MED ORDER — AMPHETAMINE-DEXTROAMPHETAMINE 10 MG PO TABS: 10.0000 mg | ORAL_TABLET | Freq: Every day | ORAL | 0 refills | Status: DC | PRN

## 2024-04-09 MED ORDER — GUANFACINE HCL ER 3 MG PO TB24: 3.0000 mg | ORAL_TABLET | Freq: Every evening | ORAL | 0 refills | Status: DC

## 2024-04-09 MED ORDER — AMPHETAMINE-DEXTROAMPHET ER 15 MG PO CP24: 15.0000 mg | ORAL_CAPSULE | ORAL | 0 refills | Status: DC

## 2024-04-09 NOTE — Assessment & Plan Note (Signed)
 Well controlled on current regimen. She does not see psychiatry or a therapist any longer. Continue Adderall XR 15mg  daily, Anafranil  25mg  daily, and Guanfacine  3mg  daily and follow up in 3 months via video visit. Encouraged adequate daily calorie intake, especially in early morning and in evening. Report changes in weight, appetite, or breakthrough symptoms.

## 2024-04-09 NOTE — Assessment & Plan Note (Addendum)
 Persistent worsening, will refer to hematology for further evaluation

## 2024-04-09 NOTE — Progress Notes (Signed)
 Subjective:  HPI: Nicole [name] is a 43 y.o. female presenting on 04/09/2024 for Diabetes (Patient wants to discuss her labs with you. States no real concerns to discuss. )   Diabetes   Patient is in today for ADHD follow up. She reports today that her symptoms remain well controlled on her current regimen. She has weaned off Anafranil . Contiues to take her medications most days during the summer with medication holidays a few days per month. 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   ADHD FOLLOW UP ADHD status: controlled Satisfied with current therapy: yes Medication compliance:  excellent compliance Controlled substance contract: yes Previous psychiatry evaluation: yes Previous medications: yes adderall and adderall XR   Taking meds on weekends/vacations: occasionally Work/school performance:  good Difficulty sustaining attention/completing tasks: yes Distracted by extraneous stimuli: yes Does not listen when spoken to: yes  Fidgets with hands or feet: yes Unable to stay in seat: yes Blurts out/interrupts others: yes ADHD Medication Side Effects: no    Decreased appetite: no    Headache: no    Sleeping disturbance pattern: no    Irritability: no    Rebound effects (worse than baseline) off medication: no    Anxiousness: no    Dizziness: no    Tics: no    Review of Systems  All other systems reviewed and are negative.   Relevant past medical history reviewed and updated as indicated.   Past Medical History:  Diagnosis Date   Anxiety    Back pain    Depression    Diabetes mellitus without complication (HCC)    GERD (gastroesophageal reflux disease)    Hypothyroidism    Palpitations    Pre-diabetes    Vitamin D  deficiency      Past Surgical History:  Procedure Laterality Date   BIOPSY  03/11/2023   Procedure: BIOPSY;  Surgeon: Eartha Angelia Sieving, MD;  [facility]: [facility];  Service: Gastroenterology;;    ESOPHAGOGASTRODUODENOSCOPY (EGD) WITH PROPOFOL  N/A 03/11/2023   Procedure: ESOPHAGOGASTRODUODENOSCOPY (EGD) WITH PROPOFOL ;  Surgeon: Eartha Angelia Sieving, MD;  Location: [facility];  Service: Gastroenterology;  Laterality: N/A;  7:30AM;ASA 1-2   MOUTH SURGERY      Allergies and medications reviewed and updated.   Current Outpatient Medications:    ACCU-CHEK GUIDE TEST test strip, as directed., Disp: , Rfl:    Barberry-Oreg Grape-Goldenseal (BERBERINE COMPLEX PO), Take 1 capsule by mouth 2 (two) times daily., Disp: , Rfl:    cetirizine (ZYRTEC) 10 MG tablet, Take 10 mg by mouth at bedtime., Disp: , Rfl:    Cholecalciferol (VITAMIN D ) 125 MCG (5000 UT) CAPS, Take 1 capsule by mouth daily., Disp: 30 capsule, Rfl: 0   CHOLINE BITARTRATE PO, Take 250 mg by mouth daily., Disp: , Rfl:    glipiZIDE  (GLUCOTROL  XL) 2.5 MG 24 hr tablet, Take 1 tablet (2.5 mg total) by mouth daily with breakfast., Disp: 90 tablet, Rfl: 1   Inositol Niacinate 250 MG TABS, Take 250 mg by mouth daily., Disp: , Rfl:    JARDIANCE 10 MG TABS tablet, Take 10 mg by mouth daily., Disp: , Rfl:    levothyroxine  (SYNTHROID ) 50 MCG tablet, Take 1-2 tablets (50-100 mcg total) by mouth daily before breakfast. 100 mcg on Sun and Wed morning, 50 mcg all other days, Disp: 60 tablet, Rfl: 0   magnesium  gluconate (MAGONATE) 500 MG tablet, Take 500 mg by mouth daily., Disp: , Rfl:    metFORMIN  (GLUCOPHAGE -XR)  500 MG 24 hr tablet, Take 1 tablet (500 mg total) by mouth 2 (two) times daily with a meal., Disp: 90 tablet, Rfl: 0   Misc Natural Products (FOCUSED MIND PO), Take 2 capsules by mouth daily. True Focus, Disp: , Rfl:    Multiple Vitamin (MULTIVITAMIN) tablet, Take 1 tablet by mouth daily., Disp: , Rfl:    Omega-3 Fatty Acids (FISH OIL) 1000 MG CAPS, Take 200 mg by mouth daily., Disp: , Rfl:    pantoprazole  (PROTONIX ) 40 MG tablet, TAKE 1 TABLET BY MOUTH TWICE A DAY, Disp: 180 tablet, Rfl: 1   Probiotic Product (PROBIOTIC DAILY  PO), Take 1 capsule by mouth daily. 25 Billion, Disp: , Rfl:    rosuvastatin (CRESTOR) 5 MG tablet, Take 5 mg by mouth daily., Disp: , Rfl:    amphetamine -dextroamphetamine  (ADDERALL XR) 15 MG 24 hr capsule, Take 1 capsule by mouth every morning., Disp: 90 capsule, Rfl: 0   amphetamine -dextroamphetamine  (ADDERALL) 10 MG tablet, Take 1 tablet (10 mg total) by mouth daily as needed (uncontrolled ADHD)., Disp: 90 tablet, Rfl: 0   clomiPRAMINE  (ANAFRANIL ) 25 MG capsule, TAKE 1 CAPSULE BY MOUTH AT BEDTIME. (Patient not taking: Reported on 04/09/2024), Disp: 90 capsule, Rfl: 1   GuanFACINE  HCl 3 MG TB24, Take 1 tablet (3 mg total) by mouth every evening., Disp: 90 tablet, Rfl: 0   hydrOXYzine  (ATARAX ) 10 MG tablet, Take 1 tablet (10 mg total) by mouth 3 (three) times daily as needed for anxiety., Disp: 30 tablet, Rfl: 1  Allergies  Allergen Reactions   Wellbutrin  [Bupropion ] Tinitus    Objective:   BP 106/70   Pulse 73   Temp 98.4 F (36.9 C) (Oral)   Ht 5' 5 (1.651 m)   Wt 200 lb 6.4 oz (90.9 kg)   SpO2 97%   BMI 33.35 kg/m      04/09/2024   10:44 AM 03/21/2024    4:01 PM 03/06/2024   12:07 PM  Vitals with BMI  Height 5' 5 5' 5 5' 5  Weight 200 lbs 6 oz 198 lbs 6 oz 200 lbs 6 oz  BMI 33.35 33.02 33.35  Systolic 106 118 897  Diastolic 70 80 68  Pulse 73 107 94     Physical Exam Vitals and nursing note reviewed.  Constitutional:      Appearance: Normal appearance. She is normal weight.  HENT:     Head: Normocephalic and atraumatic.  Skin:    General: Skin is warm and dry.  Neurological:     General: No focal deficit present.     Mental Status: She is alert and oriented to person, place, and time. Mental status is at baseline.  Psychiatric:        Mood and Affect: Mood normal.        Behavior: Behavior normal.        Thought Content: Thought content normal.        Judgment: Judgment normal.     Assessment & Plan:  Attention deficit hyperactivity disorder (ADHD),  combined type Assessment & Plan: Well controlled on current regimen. She does not see psychiatry or a therapist any longer. Continue Adderall XR 15mg  daily, Anafranil  25mg  daily, and Guanfacine  3mg  daily and follow up in 3 months via video visit. Encouraged adequate daily calorie intake, especially in early morning and in evening. Report changes in weight, appetite, or breakthrough symptoms.    Thrombocytosis Assessment & Plan: Persistent worsening, will refer to hematology for further evaluation  Orders: -  Ambulatory referral to Hematology / Oncology  Other orders -     hydrOXYzine  HCl; Take 1 tablet (10 mg total) by mouth 3 (three) times daily as needed for anxiety.  Dispense: 30 tablet; Refill: 1 -     guanFACINE  HCl ER; Take 1 tablet (3 mg total) by mouth every evening.  Dispense: 90 tablet; Refill: 0 -     Amphetamine -Dextroamphetamine ; Take 1 tablet (10 mg total) by mouth daily as needed (uncontrolled ADHD).  Dispense: 90 tablet; Refill: 0 -     Amphetamine -Dextroamphet ER; Take 1 capsule by mouth every morning.  Dispense: 90 capsule; Refill: 0     Follow up plan: Return in about 3 months (around 07/10/2024) for follow-up ADHD.  Jeoffrey GORMAN Barrio, FNP

## 2024-04-10 ENCOUNTER — Inpatient Hospital Stay: Attending: Oncology | Admitting: Oncology

## 2024-04-10 ENCOUNTER — Inpatient Hospital Stay

## 2024-04-10 VITALS — BP 110/81 | HR 78 | Temp 98.1°F | Resp 18 | Wt 198.2 lb

## 2024-04-10 DIAGNOSIS — D75839 Thrombocytosis, unspecified: Secondary | ICD-10-CM

## 2024-04-10 DIAGNOSIS — R002 Palpitations: Secondary | ICD-10-CM

## 2024-04-10 LAB — CBC WITH DIFFERENTIAL/PLATELET
Abs Immature Granulocytes: 0.03 K/uL (ref 0.00–0.07)
Basophils Absolute: 0.1 K/uL (ref 0.0–0.1)
Basophils Relative: 1 %
Eosinophils Absolute: 0.2 K/uL (ref 0.0–0.5)
Eosinophils Relative: 2 %
HCT: 40.7 % (ref 36.0–46.0)
Hemoglobin: 12.8 g/dL (ref 12.0–15.0)
Immature Granulocytes: 0 %
Lymphocytes Relative: 35 %
Lymphs Abs: 2.8 K/uL (ref 0.7–4.0)
MCH: 25.2 pg — ABNORMAL LOW (ref 26.0–34.0)
MCHC: 31.4 g/dL (ref 30.0–36.0)
MCV: 80.3 fL (ref 80.0–100.0)
Monocytes Absolute: 0.8 K/uL (ref 0.1–1.0)
Monocytes Relative: 10 %
Neutro Abs: 4.2 K/uL (ref 1.7–7.7)
Neutrophils Relative %: 52 %
Platelets: 458 K/uL — ABNORMAL HIGH (ref 150–400)
RBC: 5.07 MIL/uL (ref 3.87–5.11)
RDW: 15.5 % (ref 11.5–15.5)
WBC: 8 K/uL (ref 4.0–10.5)
nRBC: 0 % (ref 0.0–0.2)

## 2024-04-10 LAB — TECHNOLOGIST SMEAR REVIEW
Clinical Information: ELEVATED
Plt Morphology: NORMAL

## 2024-04-10 LAB — IRON AND TIBC
Iron: 54 ug/dL (ref 28–170)
Saturation Ratios: 11 % (ref 10.4–31.8)
TIBC: 478 ug/dL — ABNORMAL HIGH (ref 250–450)
UIBC: 424 ug/dL

## 2024-04-10 LAB — C-REACTIVE PROTEIN: CRP: 0.8 mg/dL (ref <1.0)

## 2024-04-10 LAB — VITAMIN D 25 HYDROXY (VIT D DEFICIENCY, FRACTURES): Vit D, 25-Hydroxy: 78.63 ng/mL (ref 30–100)

## 2024-04-10 LAB — SEDIMENTATION RATE: Sed Rate: 10 mm/h (ref 0–20)

## 2024-04-10 LAB — TSH: TSH: 1.894 u[IU]/mL (ref 0.350–4.500)

## 2024-04-10 LAB — VITAMIN B12: Vitamin B-12: 417 pg/mL (ref 180–914)

## 2024-04-10 LAB — LACTATE DEHYDROGENASE: LDH: 106 U/L (ref 98–192)

## 2024-04-10 LAB — FERRITIN: Ferritin: 5 ng/mL — ABNORMAL LOW (ref 11–307)

## 2024-04-10 NOTE — Assessment & Plan Note (Addendum)
 Referred by her PCP for elevated platelet count over the past 6 months ranging from normal to as high as 453. She denies any B symptoms.  She does admit to increased palpitations but no other vasomotor symptoms. We reviewed differential diagnosis which include reactive thrombocytosis d/t Infection (31%), Infectious plus postsurgical status (27%), Postsurgical status (16%), Malignancy (9%), Postsplenectomy state (9%) and Acute blood loss or iron deficiency (8%).  Less likely diagnoses include chronic myeloid leukemia (CML), primary myelofibrosis (PMF), polycythemia vera (PV), a number of myelodysplastic syndrome variants (MDS), atypical CML, chronic myelomonocytic leukemia (CMML), and acute myeloid leukemia (AML) can sometimes present with thrombocytosis as a prominent feature. Thus, all patients in whom a reactive process cannot be identified require a bone marrow examination with reticulin staining and cytogenetic studies. Recommend labs today including iron and B12 panel, peripheral smear, JAK2 with reflex and chronic inflammation (CRP, ESR and LDH) Recommend to return to clinic in 2 to 3 weeks to review results.

## 2024-04-10 NOTE — Assessment & Plan Note (Addendum)
 Patient has been seen GI in the past last in 2021.  Reports increased palpitations over the past few months. Reports she was diagnosed with valvular stenosis back in April 2021 having an echocardiogram performed. Discussed checking TSH today.

## 2024-04-10 NOTE — Progress Notes (Signed)
 [facility] OFFICE PROGRESS NOTE  [name], FNP  ASSESSMENT & PLAN:  Assessment & Plan Thrombocytosis Referred by her PCP for elevated platelet count over the past 6 months ranging from normal to as high as 453. She denies any B symptoms.  She does admit to increased palpitations but no other vasomotor symptoms. We reviewed differential diagnosis which include reactive thrombocytosis d/t Infection (31%), Infectious plus postsurgical status (27%), Postsurgical status (16%), Malignancy (9%), Postsplenectomy state (9%) and Acute blood loss or iron deficiency (8%).  Less likely diagnoses include chronic myeloid leukemia (CML), primary myelofibrosis (PMF), polycythemia vera (PV), a number of myelodysplastic syndrome variants (MDS), atypical CML, chronic myelomonocytic leukemia (CMML), and acute myeloid leukemia (AML) can sometimes present with thrombocytosis as a prominent feature. Thus, all patients in whom a reactive process cannot be identified require a bone marrow examination with reticulin staining and cytogenetic studies. Recommend labs today including iron and B12 panel, peripheral smear, JAK2 with reflex and chronic inflammation (CRP, ESR and LDH) Recommend to return to clinic in 2 to 3 weeks to review results. Palpitations Patient has been seen GI in the past last in 2021.  Reports increased palpitations over the past few months. Reports she was diagnosed with valvular stenosis back in April 2021 having an echocardiogram performed. Discussed checking TSH today.    Orders Placed This Encounter  Procedures   Iron and TIBC    Standing Status:   Future    Number of Occurrences:   1    Expected Date:   04/10/2024    Expiration Date:   07/09/2024   Ferritin    Standing Status:   Future    Number of Occurrences:   1    Expected Date:   04/10/2024    Expiration Date:   07/09/2024   JAK2 V617F rfx CALR/MPL/E12-15    Standing Status:   Future    Number of  Occurrences:   1    Expected Date:   04/10/2024    Expiration Date:   07/09/2024   Lactate dehydrogenase    Standing Status:   Future    Number of Occurrences:   1    Expected Date:   04/10/2024    Expiration Date:   07/09/2024   Sedimentation rate    Standing Status:   Future    Number of Occurrences:   1    Expected Date:   04/10/2024    Expiration Date:   07/09/2024   CBC with Differential (Cancer Center Only)    Standing Status:   Future    Number of Occurrences:   1    Expected Date:   04/10/2024    Expiration Date:   07/09/2024   Technologist smear review    Standing Status:   Future    Number of Occurrences:   1    Expected Date:   04/10/2024    Expiration Date:   07/09/2024    Clinical information::   elevated platelets   Vitamin B12    Standing Status:   Future    Number of Occurrences:   1    Expected Date:   04/10/2024    Expiration Date:   07/09/2024   Methylmalonic acid, serum    Standing Status:   Future    Number of Occurrences:   1    Expected Date:   04/10/2024    Expiration Date:   07/09/2024   C-reactive protein    Standing Status:  Future    Number of Occurrences:   1    Expected Date:   04/10/2024    Expiration Date:   07/09/2024   ANA, IFA (with reflex)    Standing Status:   Future    Number of Occurrences:   1    Expected Date:   04/10/2024    Expiration Date:   04/10/2025   Rheumatoid factor    Standing Status:   Future    Number of Occurrences:   1    Expected Date:   04/10/2024    Expiration Date:   04/10/2025   VITAMIN D  25 Hydroxy (Vit-D Deficiency, Fractures)    Standing Status:   Future    Number of Occurrences:   1    Expected Date:   04/10/2024    Expiration Date:   04/10/2025   Copper , serum    Standing Status:   Future    Number of Occurrences:   1    Expected Date:   04/10/2024    Expiration Date:   04/10/2025   TSH    Standing Status:   Future    Number of Occurrences:   1    Expiration Date:   04/10/2025    INTERVAL  HISTORY: Patient presents today for thrombocytosis.  Reports she was sent over by her PCP.  Reports overall she has been doing well.  Reports 3-4 nights where she wakes up soaking wet from night sweats.  Denies unintentional weight loss or change in appetite.  Reports energy levels are 70%.  Denies any pain.  She was recently on prednisone  for a bee sting in early June.  She denies any joint pain or swelling.  Reports maternal aunt had DCIS and another maternal aunt was diagnosed with pancreatic cancer.  She is currently undergoing chemotherapy.  Family history of blood cancers. Denies history of connective tissue disorder or chronic inflammation.   She denies any easy bruising or bleeding.  Denies history of nutritional deficiencies such as iron deficiency.  She is currently not on oral iron or vitamin B12 but does take a outside vitamin that contains both.  She does take a separate vitamin D  tablet.  Denies history of blood clots.  Denies any use of NSAIDs.  No recent infections.   We reviewed cbc/d, cmp   SUMMARY OF HEMATOLOGIC HISTORY: Patient is a 43 year old female was referred to hematology for thrombocytosis.  On chart review, it appears she has had an elevated platelet count since January of this year.  Platelets have ranged from normal to as high as 453.  Patient has past medical history significant for diabetes, ADHD, vitamin D  deficiency, depression and anxiety.  Palpitations and has been seen by cardiology but it has been many years.  Lab Results  Component Value Date   HGB 12.3 04/06/2024   VITAMINB12 449 08/11/2022    Vitals:   04/10/24 0829  BP: 110/81  Pulse: 78  Resp: 18  Temp: 98.1 F (36.7 C)  SpO2: 99%    I spent 45 minutes dedicated to the care of this patient (face-to-face and non-face-to-face) on the date of the encounter to include what is described in the assessment and plan.,  [name], NP 04/10/2024 9:01 AM

## 2024-04-11 ENCOUNTER — Ambulatory Visit: Payer: Self-pay | Admitting: Oncology

## 2024-04-11 ENCOUNTER — Other Ambulatory Visit: Payer: Self-pay | Admitting: Oncology

## 2024-04-11 DIAGNOSIS — D509 Iron deficiency anemia, unspecified: Secondary | ICD-10-CM | POA: Insufficient documentation

## 2024-04-11 LAB — ANTINUCLEAR ANTIBODIES, IFA: ANA Ab, IFA: NEGATIVE

## 2024-04-11 LAB — RHEUMATOID FACTOR: Rheumatoid fact SerPl-aCnc: <10 [IU]/mL (ref <14.0)

## 2024-04-11 NOTE — Progress Notes (Signed)
 Patient notified and is agreeable to receiving iron infusions.  Will arrange.

## 2024-04-11 NOTE — Progress Notes (Signed)
 Hey Tomi, this patient needs IV iron if she is agreeable.  I normally would just wait for her appointment in a couple of weeks but she was having palpitations and I do not want them to get worse.  Let me know if she would like IV iron and we can get that arranged for her.  [name], NP 04/11/2024 1:09 PM

## 2024-04-12 LAB — METHYLMALONIC ACID, SERUM: Methylmalonic Acid, Quantitative: 133 nmol/L (ref 0–378)

## 2024-04-12 LAB — COPPER, SERUM: Copper: 144 ug/dL (ref 80–158)

## 2024-04-17 LAB — JAK2 V617F RFX CALR/MPL/E12-15

## 2024-04-17 LAB — CALR +MPL + E12-E15  (REFLEX)

## 2024-04-20 ENCOUNTER — Inpatient Hospital Stay: Attending: Oncology

## 2024-04-20 VITALS — BP 101/67 | HR 88 | Temp 99.1°F | Resp 18

## 2024-04-20 DIAGNOSIS — E611 Iron deficiency: Secondary | ICD-10-CM | POA: Diagnosis present

## 2024-04-20 DIAGNOSIS — D509 Iron deficiency anemia, unspecified: Secondary | ICD-10-CM

## 2024-04-20 MED ORDER — SODIUM CHLORIDE 0.9 % IV SOLN
50.0000 mg | Freq: Once | INTRAVENOUS | Status: AC
  Administered 2024-04-20: 50 mg via INTRAVENOUS
  Filled 2024-04-20: qty 1

## 2024-04-20 MED ORDER — CETIRIZINE HCL 10 MG/ML IV SOLN
10.0000 mg | Freq: Once | INTRAVENOUS | Status: AC
  Administered 2024-04-20: 10 mg via INTRAVENOUS
  Filled 2024-04-20: qty 1

## 2024-04-20 MED ORDER — METHYLPREDNISOLONE SODIUM SUCC 125 MG IJ SOLR
125.0000 mg | Freq: Once | INTRAMUSCULAR | Status: AC
  Administered 2024-04-20: 125 mg via INTRAVENOUS
  Filled 2024-04-20: qty 2

## 2024-04-20 MED ORDER — SODIUM CHLORIDE 0.9 % IV SOLN
950.0000 mg | Freq: Once | INTRAVENOUS | Status: AC
  Administered 2024-04-20: 950 mg via INTRAVENOUS
  Filled 2024-04-20: qty 19

## 2024-04-20 MED ORDER — SODIUM CHLORIDE 0.9 % IV SOLN: INTRAVENOUS | Status: DC

## 2024-04-20 MED ORDER — FAMOTIDINE IN NACL 20-0.9 MG/50ML-% IV SOLN
20.0000 mg | Freq: Once | INTRAVENOUS | Status: AC
  Administered 2024-04-20: 20 mg via INTRAVENOUS
  Filled 2024-04-20: qty 50

## 2024-04-20 MED ORDER — ACETAMINOPHEN 325 MG PO TABS
650.0000 mg | ORAL_TABLET | Freq: Once | ORAL | Status: AC
  Administered 2024-04-20: 650 mg via ORAL
  Filled 2024-04-20: qty 2

## 2024-04-20 NOTE — Patient Instructions (Signed)
 Iron Dextran Injection What is this medication? IRON DEXTRAN (EYE ern DEX tran) treats low levels of iron in your body. Iron is a mineral that plays an important role in making red blood cells, which carry oxygen from your lungs to the rest of your body. This medicine may be used for other purposes; ask your health care provider or pharmacist if you have questions. COMMON BRAND NAME(S): Dexferrum, INFeD What should I tell my care team before I take this medication? They need to know if you have any of these conditions: Anemia not caused by low iron levels Heart disease High levels of iron in the blood Kidney disease Liver disease An unusual or allergic reaction to iron, other medications, foods, dyes, or preservatives Pregnant or trying to get pregnant Breastfeeding How should I use this medication? This medication is injected into a vein or a muscle. It is given by your care team in a hospital or clinic setting. Talk to your care team about the use of this medication in children. While it may be prescribed for children as young as 4 months for selected conditions, precautions do apply. Overdosage: If you think you have taken too much of this medicine contact a poison control center or emergency room at once. NOTE: This medicine is only for you. Do not share this medicine with others. What if I miss a dose? Keep appointments for follow-up doses. It is important not to miss your dose. Call your care team if you are unable to keep an appointment. What may interact with this medication? Do not take this medication with any of the following: Deferoxamine Dimercaprol Other iron products This medication may also interact with the following: Chloramphenicol Deferasirox This list may not describe all possible interactions. Give your health care provider a list of all the medicines, herbs, non-prescription drugs, or dietary supplements you use. Also tell them if you smoke, drink alcohol, or use  illegal drugs. Some items may interact with your medicine. What should I watch for while using this medication? Visit your care team for regular checks on your progress. Tell your care team if your symptoms do not start to get better or if they get worse. You may need blood work while taking this medication. You may need to eat more foods that contain iron. Talk to your care team. Foods that contain iron include whole grains/cereals, dried fruits, beans, peas, leafy green vegetables, and organ meats (liver, kidney). Long-term use of this medication may increase your risk of some cancers. Talk to your care team about your risk of cancer. What side effects may I notice from receiving this medication? Side effects that you should report to your care team as soon as possible: Allergic reactions--skin rash, itching, hives, swelling of the face, lips, tongue, or throat Low blood pressure--dizziness, feeling faint or lightheaded, blurry vision Shortness of breath Side effects that usually do not require medical attention (report to your care team if they continue or are bothersome): Flushing Headache Joint pain Muscle pain Nausea Pain, redness, or irritation at injection site This list may not describe all possible side effects. Call your doctor for medical advice about side effects. You may report side effects to FDA at 1-800-FDA-1088. Where should I keep my medication? This medication is given in a hospital or clinic. It will not be stored at home. NOTE: This sheet is a summary. It may not cover all possible information. If you have questions about this medicine, talk to your doctor, pharmacist, or health  care provider.  2024 Elsevier/Gold Standard (2023-04-20 00:00:00)

## 2024-04-20 NOTE — Progress Notes (Signed)
 Patient tolerated iron infusion with no complaints voiced.  Peripheral IV site clean and dry with good blood return noted before and after infusion.  Band aid applied. Pt observed for 30 minutes post iron without any complications. VSS with discharge and left in satisfactory condition with no s/s of distress noted. All follow ups as scheduled.   Nicole [name] [employer]

## 2024-04-24 ENCOUNTER — Inpatient Hospital Stay: Admitting: Oncology

## 2024-04-24 VITALS — BP 117/88 | HR 83 | Temp 99.0°F | Resp 20 | Wt 196.7 lb

## 2024-04-24 DIAGNOSIS — E611 Iron deficiency: Secondary | ICD-10-CM | POA: Diagnosis not present

## 2024-04-24 DIAGNOSIS — D75839 Thrombocytosis, unspecified: Secondary | ICD-10-CM | POA: Diagnosis not present

## 2024-04-24 NOTE — Assessment & Plan Note (Addendum)
 Referred by her PCP for elevated platelet count over the past 6 months ranging from normal to as high as 453. She denies any B symptoms.  She does admit to increased palpitations but no other vasomotor symptoms. Etiology of thrombocytosis likely secondary to iron  deficiency. Workup from 04/10/2024 showed persistent thrombocytosis with an elevated platelet count of 458, normal CBC with differential.  Iron  panel showed an iron  saturation of 11% with an elevated TIBC.  Ferritin was 5.  B12 and MMA were WNL.  Vitamin D  level and copper  level are also normal. CRP, LDH and sed rate were WNL.  TSH normal.  JAK2 testing with reflex were all normal.  ANA and rheumatoid factor are WNL. She received 1 g INFeD  on 04/20/2024 due to extremely low ferritin levels. Tolerated iron  well.  We will repeat her labs in approximately 3 months.  She is currently not on any type of over-the-counter supplement except for vitamin D . Recommend continuing vitamin D  but reducing to every other day given high vitamin D  levels along with starting a B12 supplement 500 to 1000 mcg daily.  We discussed not starting iron  tablets at this time and we can reassess her lab work in 3 months.  PLAN SUMMARY: >> Recheck labs in 3 months to see if additional IV iron  is needed. >> Reduce vitamin D  to every other day.  >> Start B12 supplements 500 to 1000 mcg daily. >> Discussed not starting oral iron  at this time and will recheck labs in 3 months.

## 2024-04-24 NOTE — Patient Instructions (Signed)
 B12 daily 500 mcg or 1000 mcg daily.   No oral iron .   See you back in 3 months.

## 2024-04-24 NOTE — Progress Notes (Unsigned)
 [name] OFFICE PROGRESS NOTE  Nicole Jeoffrey RAMAN, FNP  ASSESSMENT & PLAN:  Assessment & Plan Thrombocytosis Referred by her PCP for elevated platelet count over the past 6 months ranging from normal to as high as 453. She denies any B symptoms.  She does admit to increased palpitations but no other vasomotor symptoms. Etiology of thrombocytosis likely secondary to iron  deficiency. Workup from 04/10/2024 showed persistent thrombocytosis with an elevated platelet count of 458, normal CBC with differential.  Iron  panel showed an iron  saturation of 11% with an elevated TIBC.  Ferritin was 5.  B12 and MMA were WNL.  Vitamin D  level and copper  level are also normal. CRP, LDH and sed rate were WNL.  TSH normal.  JAK2 testing with reflex were all normal.  ANA and rheumatoid factor are WNL. She received 1 g INFeD  on 04/20/2024 due to extremely low ferritin levels. Tolerated iron  well.  We will repeat her labs in approximately 3 months.  She is currently not on any type of over-the-counter supplement except for vitamin D . Recommend continuing vitamin D  but reducing to every other day given high vitamin D  levels along with starting a B12 supplement 500 to 1000 mcg daily.  We discussed not starting iron  tablets at this time and we can reassess her lab work in 3 months.  PLAN SUMMARY: >> Recheck labs in 3 months to see if additional IV iron  is needed. >> Reduce vitamin D  to every other day.  >> Start B12 supplements 500 to 1000 mcg daily. >> Discussed not starting oral iron  at this time and will recheck labs in 3 months.        Orders Placed This Encounter  Procedures   CBC with Differential/Platelet    Standing Status:   Future    Expected Date:   07/25/2024    Expiration Date:   04/24/2025   C-reactive protein    Standing Status:   Future    Expected Date:   07/25/2024    Expiration Date:   04/24/2025   Vitamin B12    Standing Status:   Future    Expected Date:   07/25/2024     Expiration Date:   04/24/2025   Ferritin    Standing Status:   Future    Expected Date:   07/25/2024    Expiration Date:   04/24/2025   Iron  and TIBC    Standing Status:   Future    Expected Date:   07/25/2024    Expiration Date:   04/24/2025    INTERVAL HISTORY: Since her last visit, she received 1 g INFeD  on 04/20/2024 with good tolerance.  Reports she has never taken oral iron  supplements.  She is currently taking vitamin D  supplements.  She admits to heavy menstrual cycles at least on the 1st and 2nd day.  Reports cycles about every 30 days although they vary.  Reports cycles have been heavier since the birth of her third child about 10 years ago.  She denies any melena, hematochezia or bright red blood per rectum.  Reports overall feeling stable.  Still having some fatigue.   SUMMARY OF HEMATOLOGIC HISTORY: Patient is a 43 year old female was referred to hematology for thrombocytosis.  On chart review, it appears she has had an elevated platelet count since January of this year.  Platelets have ranged from normal to as high as 453.  Patient has past medical history significant for diabetes, ADHD, vitamin D  deficiency, depression and anxiety.  Palpitations  and has been seen by cardiology but it has been many years.  Lab Results  Component Value Date   HGB 12.8 04/10/2024   FERRITIN 5 (L) 04/10/2024   VITAMINB12 417 04/10/2024    Vitals:   04/24/24 1140  BP: 117/88  Pulse: 83  Resp: 20  Temp: 99 F (37.2 C)  SpO2: 98%    Review of Systems  Constitutional:  Positive for malaise/fatigue.    Physical Exam Constitutional:      Appearance: Normal appearance.  Cardiovascular:     Rate and Rhythm: Normal rate and regular rhythm.  Pulmonary:     Effort: Pulmonary effort is normal.     Breath sounds: Normal breath sounds.  Abdominal:     General: Bowel sounds are normal.     Palpations: Abdomen is soft.  Musculoskeletal:        General: No swelling. Normal range of  motion.  Neurological:     Mental Status: She is alert and oriented to person, place, and time. Mental status is at baseline.    I spent 25 minutes dedicated to the care of this patient (face-to-face and non-face-to-face) on the date of the encounter to include what is described in the assessment and plan.   [name], NP 04/24/2024 1:04 PM

## 2024-04-25 ENCOUNTER — Other Ambulatory Visit

## 2024-04-25 ENCOUNTER — Encounter: Admitting: Oncology

## 2024-04-26 ENCOUNTER — Encounter: Payer: Self-pay | Admitting: Oncology

## 2024-04-26 ENCOUNTER — Encounter (INDEPENDENT_AMBULATORY_CARE_PROVIDER_SITE_OTHER): Payer: Self-pay | Admitting: Gastroenterology

## 2024-04-26 ENCOUNTER — Ambulatory Visit (INDEPENDENT_AMBULATORY_CARE_PROVIDER_SITE_OTHER): Payer: Medicaid Other | Admitting: Gastroenterology

## 2024-04-26 VITALS — BP 98/67 | HR 84 | Temp 98.2°F | Ht 65.0 in | Wt 195.0 lb

## 2024-04-26 DIAGNOSIS — D509 Iron deficiency anemia, unspecified: Secondary | ICD-10-CM | POA: Diagnosis not present

## 2024-04-26 DIAGNOSIS — K219 Gastro-esophageal reflux disease without esophagitis: Secondary | ICD-10-CM | POA: Diagnosis not present

## 2024-04-26 MED ORDER — OMEPRAZOLE 40 MG PO CPDR: 40.0000 mg | DELAYED_RELEASE_CAPSULE | Freq: Every day | ORAL | 1 refills | Status: AC

## 2024-04-26 NOTE — Patient Instructions (Signed)
 As discussed with new findings of iron  deficiency, I would urge you to consider a colonoscopy for further evaluation as iron  deficiency can come from many different things such as malabsorption of iron  and heavy periods, we can also see blood loss in the GI tract and it is important to rule this out.   Please continue to follow with hematology and consume an iron  rich diet  We will stop protonix  and start omeprazole  40mg  daily Be mindful of greasy, spicy, fried, citrus foods,caffeine, carbonated drinks, chocolate and alcohol can increase reflux symptoms Stay upright 2-3 hours after eating, prior to lying down and avoid eating late in the evenings.  Follow up 3 months  It was a pleasure to see you today. I want to create trusting relationships with patients and provide genuine, compassionate, and quality care. I truly value your feedback! please be on the lookout for a survey regarding your visit with me today. I appreciate your input about our visit and your time in completing this!    Mckinzy Fuller L. Daishon Chui, MSN, APRN, AGNP-C Adult-Gerontology Nurse Practitioner [facility]astroenter[facility] at [facility]

## 2024-04-26 NOTE — Progress Notes (Signed)
 Referring Provider: [name], FNP Primary Care Physician:  [name], FNP Primary GI Physician: Dr. Eartha   Chief Complaint  Patient presents with   Follow-up    Pt arrives for follow up. No issues/concerns at this time.    HPI:   Nicole [name] is a 43 y.o. female with past medical history of GERD, DM, depression, hypothyroidism    Patient presenting today for:  Follow up of GERD and new onset IDA  Last seen August 2024, at that time occasionally needing famotidine  20mg  at bedtime, maybe twice per week if she eats closer to bedtime. Does elevated HOB. Symptoms mostly well controlled. May be interested in TIF at a later date  Recommended continue protonix  40mg  BID, famotidine  at bedtime PRN, good reflux precautions, pt to make me aware if interested in TIF procedure  Recent labs in July with hgb 12.8,  ferritin 5, iron  54, TIBC 478, sat 11, b12 417 plt count 458 Following with hematology with planned iron  infusion  Present: Taking protonix  40mg  daily, taking famotidine  PRN, maybe 2-3 times per week for heartburn, she often will have to take tums on top of the famotidine .   She reports recently diagnosed with iron  deficiency and had to have an Iron  infusion. Has blood work in the next few months to see how iron  levels are doing, not currently on PO supplementation. She does endorse heavier periods for the first 1-2 days of her cycle, though they are regular. She endorses fatigue, had iron  infusion last Friday and does not really feel much better yet. She reports upcoming menstrual cycle and some constipation. She denies any rectal bleeding, melena or change in bowel habits. No diarrhea or bloating. She endorses maybe 5 pounds of weight loss over the past few months. Denies any abdominal pain.   Last Colonoscopy: never Last Endoscopy:02/2023 - Z-line irregular, 33 cm from the incisors.                            Biopsied.                           - 2 cm hiatal hernia.                            - Gastroesophageal flap valve classified as Hill                            Grade III (minimal fold, loose to endoscope, hiatal                            hernia likely).                           - A few fundic gland polyps.                           - A single mucosal papule (nodule) found in the                            stomach. Biopsied.                       -  Normal examined duodenum.   Path: focal intestinal metaplasia in the stomach repeat EGD in 3 years for gastric mapping  Filed Weights   04/26/24 0955  Weight: 195 lb (88.5 kg)     Past Medical History:  Diagnosis Date   Anxiety    Back pain    Depression    Diabetes mellitus without complication (HCC)    GERD (gastroesophageal reflux disease)    Hypothyroidism    Palpitations    Pre-diabetes    Vitamin D  deficiency     Past Surgical History:  Procedure Laterality Date   BIOPSY  03/11/2023   Procedure: BIOPSY;  Surgeon: Eartha Angelia Sieving, MD;  Location: [facility];  Service: Gastroenterology;;   ESOPHAGOGASTRODUODENOSCOPY (EGD) WITH PROPOFOL  N/A 03/11/2023   Procedure: ESOPHAGOGASTRODUODENOSCOPY (EGD) WITH PROPOFOL ;  Surgeon: Eartha Angelia Sieving, MD;  Location: [facility];  Service: Gastroenterology;  Laterality: N/A;  7:30AM;ASA 1-2   MOUTH SURGERY      Current Outpatient Medications  Medication Sig Dispense Refill   ACCU-CHEK GUIDE TEST test strip as directed.     amphetamine -dextroamphetamine  (ADDERALL XR) 15 MG 24 hr capsule Take 1 capsule by mouth every morning. 90 capsule 0   amphetamine -dextroamphetamine  (ADDERALL) 10 MG tablet Take 1 tablet (10 mg total) by mouth daily as needed (uncontrolled ADHD). 90 tablet 0   Barberry-Oreg Grape-Goldenseal (BERBERINE COMPLEX PO) Take 1 capsule by mouth 2 (two) times daily.     cetirizine  (ZYRTEC ) 10 MG tablet Take 10 mg by mouth at bedtime.     Cholecalciferol (VITAMIN D ) 125 MCG (5000 UT) CAPS Take 1 capsule by mouth daily.  30 capsule 0   CHOLINE BITARTRATE PO Take 250 mg by mouth daily.     GuanFACINE  HCl 3 MG TB24 Take 1 tablet (3 mg total) by mouth every evening. 90 tablet 0   hydrOXYzine  (ATARAX ) 10 MG tablet Take 1 tablet (10 mg total) by mouth 3 (three) times daily as needed for anxiety. 30 tablet 1   Inositol Niacinate 250 MG TABS Take 250 mg by mouth daily.     JARDIANCE 10 MG TABS tablet Take 10 mg by mouth daily.     levothyroxine  (SYNTHROID ) 50 MCG tablet Take 1-2 tablets (50-100 mcg total) by mouth daily before breakfast. 100 mcg on Sun and Wed morning, 50 mcg all other days 60 tablet 0   magnesium  gluconate (MAGONATE) 500 MG tablet Take 500 mg by mouth daily.     metFORMIN  (GLUCOPHAGE -XR) 500 MG 24 hr tablet Take 1 tablet (500 mg total) by mouth 2 (two) times daily with a meal. (Patient taking differently: Take 500 mg by mouth daily.) 90 tablet 0   Misc Natural Products (FOCUSED MIND PO) Take 2 capsules by mouth daily. True Focus     Multiple Vitamin (MULTIVITAMIN) tablet Take 1 tablet by mouth daily.     Omega-3 Fatty Acids (FISH OIL) 1000 MG CAPS Take 200 mg by mouth daily.     pantoprazole  (PROTONIX ) 40 MG tablet TAKE 1 TABLET BY MOUTH TWICE A DAY (Patient taking differently: Take 40 mg by mouth daily.) 180 tablet 1   Probiotic Product (PROBIOTIC DAILY PO) Take 1 capsule by mouth daily. 25 Billion     rosuvastatin (CRESTOR) 5 MG tablet Take 5 mg by mouth daily.     No current facility-administered medications for this visit.    Allergies as of 04/26/2024 - Review Complete 04/26/2024  Allergen Reaction Noted   Wellbutrin  [bupropion ] Tinitus 10/25/2019    Social History  Socioeconomic History   Marital status: Married    Spouse name: Not on file   Number of children: 3   Years of education: Not on file   Highest education level: Bachelor's degree (e.g., BA, AB, BS)  Occupational History   Occupation: stay at home mom  Tobacco Use   Smoking status: Never   Smokeless tobacco: Never   Vaping Use   Vaping status: Never Used  Substance and Sexual Activity   Alcohol use: No   Drug use: No   Sexual activity: Yes    Birth control/protection: Surgical    Comment: vasectomy  Other Topics Concern   Not on file  Social History Narrative   Not on file   Social Drivers of Health   Financial Resource Strain: Low Risk  (03/20/2024)   Overall Financial Resource Strain (CARDIA)    Difficulty of Paying Living Expenses: Not very hard  Food Insecurity: No Food Insecurity (04/10/2024)   Hunger Vital Sign    Worried About Running Out of Food in the Last Year: Never true    Ran Out of Food in the Last Year: Never true  Transportation Needs: No Transportation Needs (04/10/2024)   PRAPARE - Administrator, Civil Service (Medical): No    Lack of Transportation (Non-Medical): No  Physical Activity: Insufficiently Active (03/20/2024)   Exercise Vital Sign    Days of Exercise per Week: 1 day    Minutes of Exercise per Session: 20 min  Stress: No Stress Concern Present (03/20/2024)   [employer] of Occupational Health - Occupational Stress Questionnaire    Feeling of Stress: Not at all  Social Connections: Socially Integrated (03/20/2024)   Social Connection and Isolation Panel    Frequency of Communication with Friends and Family: Twice a week    Frequency of Social Gatherings with Friends and Family: Once a week    Attends Religious Services: More than 4 times per year    Active Member of [employer] or Organizations: Yes    Attends Engineer, structural: More than 4 times per year    Marital Status: Married    Review of systems General: negative for malaise, night sweats, fever, chills+weight loss  Neck: Negative for lumps, goiter, pain and significant neck swelling Resp: Negative for cough, wheezing, dyspnea at rest CV: Negative for chest pain, leg swelling, palpitations, orthopnea GI: denies melena, hematochezia, nausea, vomiting, diarrhea, constipation,  dysphagia, odyonophagia, early satiety  +GERD symptoms +weight loss  MSK: Negative for joint pain or swelling, back pain, and muscle pain. Derm: Negative for itching or rash Psych: Denies depression, anxiety, memory loss, confusion. No homicidal or suicidal ideation.  Heme: Negative for prolonged bleeding, bruising easily, and swollen nodes. Endocrine: Negative for cold or heat intolerance, polyuria, polydipsia and goiter. Neuro: negative for tremor, gait imbalance, syncope and seizures. The remainder of the review of systems is noncontributory.  Physical Exam: BP 98/67   Pulse 84   Temp 98.2 F (36.8 C)   Ht 5' 5 (1.651 m)   Wt 195 lb (88.5 kg)   LMP 03/26/2024 (Approximate)   BMI 32.45 kg/m  General:   Alert and oriented. No distress noted. Pleasant and cooperative.  Head:  Normocephalic and atraumatic. Eyes:  Conjuctiva clear without scleral icterus. Mouth:  Oral mucosa pink and moist. Good dentition. No lesions. Heart: Normal rate and rhythm, s1 and s2 heart sounds present.  Lungs: Clear lung sounds in all lobes. Respirations equal and unlabored. Abdomen:  +BS, soft, non-tender  and non-distended. No rebound or guarding. No HSM or masses noted. Derm: No palmar erythema or jaundice Msk:  Symmetrical without gross deformities. Normal posture. Extremities:  Without edema. Neurologic:  Alert and  oriented x4 Psych:  Alert and cooperative. Normal mood and affect.  Invalid input(s): 6 MONTHS   ASSESSMENT: Raea Magallon is a 43 y.o. female presenting today for follow up of GERD and with new onset IDA  New onset iron  deficiency: ferritin quite low at 5, hgb as normal at 12.8, iron  54, sat 11. She endorses fatigue. No rectal bleeding or melena. She has had some weight loss, approximately 14 pounds since January though notably her Clomipramine  was stopped around this time which is known to cause weight gain so her decline in weight could likely be due to coming off of this. Does have  heavier periods at onset of her cycle. Had an iron  infusion last week but has felt no improvement just yet. Has follow up with hematology in October. Fairly recent EGD last year but no previous colonoscopy. Iron  deficiency could be secondary to malabsorption in setting of PPI therapy or due to heavy menstrual periods though I did discuss colonoscopy with the patient to evaluate for GI sources of blood loss. She will consider colonoscopy and let me know if she wishes to proceed with this. She should continue to follow with hematology, will provide iron  rich diet handout as well.   GERD: not currently well controlled on protonix  40mg  daily with PRN use of famotidine . Having symptoms 2-3 times per week with heartburn. Having to take tums usually on top of famotidine . Hesitant to increase PPI to BID dosing in setting of iron  deficiency. Will stop protonix  and start omeprazole  40mg  daily.    PLAN:  -stop protonix  -start omeprazole  40mg  daily and good reflux precautions -continue to follow with hematology -pt to consider colonoscopy in light of new IDA -iron  rich diet  All questions were answered, patient verbalized understanding and is in agreement with plan as outlined above.   Follow Up: 3 months  Tavyn Kurka L. Mariette, MSN, APRN, AGNP-C Adult-Gerontology Nurse Practitioner [facility]  I have reviewed the note and agree with the APP's assessment as described in this progress note  Toribio Fortune, MD Gastroenterology and Hepatology [facility] Gastroenterology

## 2024-04-28 ENCOUNTER — Other Ambulatory Visit: Payer: Self-pay | Admitting: Family Medicine

## 2024-05-01 ENCOUNTER — Encounter: Payer: Self-pay | Admitting: Oncology

## 2024-05-02 ENCOUNTER — Encounter: Payer: Self-pay | Admitting: Family Medicine

## 2024-06-12 ENCOUNTER — Encounter: Payer: Self-pay | Admitting: Family Medicine

## 2024-06-15 ENCOUNTER — Encounter: Payer: Self-pay | Admitting: Family Medicine

## 2024-06-19 ENCOUNTER — Other Ambulatory Visit: Payer: Self-pay | Admitting: Family Medicine

## 2024-06-19 MED ORDER — AMPHETAMINE-DEXTROAMPHET ER 15 MG PO CP24: 15.0000 mg | ORAL_CAPSULE | ORAL | 0 refills | Status: DC

## 2024-06-21 ENCOUNTER — Other Ambulatory Visit: Payer: Self-pay | Admitting: Family Medicine

## 2024-06-21 MED ORDER — AMPHETAMINE-DEXTROAMPHET ER 15 MG PO CP24: 15.0000 mg | ORAL_CAPSULE | ORAL | 0 refills | Status: DC

## 2024-06-24 ENCOUNTER — Encounter: Payer: Self-pay | Admitting: Family Medicine

## 2024-06-25 ENCOUNTER — Other Ambulatory Visit: Payer: Self-pay | Admitting: Family Medicine

## 2024-06-25 MED ORDER — FLUCONAZOLE 150 MG PO TABS: 150.0000 mg | ORAL_TABLET | Freq: Once | ORAL | 0 refills | Status: AC

## 2024-06-27 ENCOUNTER — Encounter (INDEPENDENT_AMBULATORY_CARE_PROVIDER_SITE_OTHER): Payer: Self-pay | Admitting: Gastroenterology

## 2024-07-02 ENCOUNTER — Inpatient Hospital Stay: Attending: Oncology

## 2024-07-02 DIAGNOSIS — D75839 Thrombocytosis, unspecified: Secondary | ICD-10-CM | POA: Insufficient documentation

## 2024-07-02 LAB — CBC WITH DIFFERENTIAL/PLATELET
Abs Immature Granulocytes: 0.03 K/uL (ref 0.00–0.07)
Basophils Absolute: 0.1 K/uL (ref 0.0–0.1)
Basophils Relative: 1 %
Eosinophils Absolute: 0.2 K/uL (ref 0.0–0.5)
Eosinophils Relative: 2 %
HCT: 45.3 % (ref 36.0–46.0)
Hemoglobin: 14.8 g/dL (ref 12.0–15.0)
Immature Granulocytes: 0 %
Lymphocytes Relative: 40 %
Lymphs Abs: 3 K/uL (ref 0.7–4.0)
MCH: 28.5 pg (ref 26.0–34.0)
MCHC: 32.7 g/dL (ref 30.0–36.0)
MCV: 87.3 fL (ref 80.0–100.0)
Monocytes Absolute: 0.6 K/uL (ref 0.1–1.0)
Monocytes Relative: 9 %
Neutro Abs: 3.5 K/uL (ref 1.7–7.7)
Neutrophils Relative %: 48 %
Platelets: 404 K/uL — ABNORMAL HIGH (ref 150–400)
RBC: 5.19 MIL/uL — ABNORMAL HIGH (ref 3.87–5.11)
RDW: 17.1 % — ABNORMAL HIGH (ref 11.5–15.5)
WBC: 7.4 K/uL (ref 4.0–10.5)
nRBC: 0 % (ref 0.0–0.2)

## 2024-07-02 LAB — VITAMIN B12: Vitamin B-12: 674 pg/mL (ref 180–914)

## 2024-07-02 LAB — C-REACTIVE PROTEIN: CRP: 0.9 mg/dL (ref <1.0)

## 2024-07-02 LAB — IRON AND TIBC
Iron: 129 ug/dL (ref 28–170)
Saturation Ratios: 32 % — ABNORMAL HIGH (ref 10.4–31.8)
TIBC: 409 ug/dL (ref 250–450)
UIBC: 280 ug/dL

## 2024-07-02 LAB — FERRITIN: Ferritin: 49 ng/mL (ref 11–307)

## 2024-07-03 ENCOUNTER — Encounter: Payer: Self-pay | Admitting: Family Medicine

## 2024-07-04 ENCOUNTER — Other Ambulatory Visit: Payer: Self-pay

## 2024-07-04 DIAGNOSIS — E1165 Type 2 diabetes mellitus with hyperglycemia: Secondary | ICD-10-CM

## 2024-07-04 NOTE — Telephone Encounter (Signed)
 Labs have been ordered for future draw on 10/27 ( to adhere w/ A1 C ) for [facility] labs in [place] per patient request.

## 2024-07-09 NOTE — Telephone Encounter (Unsigned)
 Copied from CRM [id number]. Topic: Clinical - Lab/Test Results >> Jul 09, 2024  8:49 AM Vena HERO wrote: Reason for CRM: is currently at lab in [place] with no orders in. Please place orders and call pt at  [phone]

## 2024-07-10 ENCOUNTER — Inpatient Hospital Stay: Admitting: Oncology

## 2024-07-10 DIAGNOSIS — D75839 Thrombocytosis, unspecified: Secondary | ICD-10-CM

## 2024-07-10 NOTE — Progress Notes (Signed)
 [facility] OFFICE PROGRESS NOTE  [name], FNP  ASSESSMENT & PLAN:    I connected with Nicole [name] on 07/10/24 at 11:15 AM EDT by telephone visit and verified that I am speaking with the correct person using two identifiers.   I discussed the limitations, risks, security and privacy concerns of performing an evaluation and management service by telemedicine and the availability of in-person appointments. I also discussed with the patient that there may be a patient responsible charge related to this service. The patient expressed understanding and agreed to proceed.   Other persons participating in the visit and their role in the encounter: NP, Patient   Patient's location: Home  Provider's location: Clinic   Assessment & Plan Thrombocytosis Referred by PCP for an elevated platelet count ranging from 403-458. Etiology of thrombocytosis likely secondary to iron  deficiency. Workup from 04/10/2024 showed persistent thrombocytosis with an elevated platelet count of 458, normal CBC with differential.  Iron  panel showed an iron  saturation of 11% with an elevated TIBC.  Ferritin was 5.  B12 and MMA were WNL.  Vitamin D  level and copper  level are also normal. CRP, LDH and sed rate were WNL.  TSH normal.  JAK2 testing with reflex were all normal.  ANA and rheumatoid factor are WNL. She received 1 g INFeD  on 04/20/2024 due to extremely low ferritin levels. Tolerated iron  well.   She is currently not on any type of over-the-counter supplement except for vitamin D . Repeat labs from 07/02/2024 showed platelets of 404, hemoglobin 14.8, iron  saturation 32% with normal TIBC.  Ferritin 49.  B12 674. She does not need any additional IV iron  at this time.  Recommend starting ferrous sulfate or ferrous gluconate 1 tablet every other day with vitamin C.  Reports she has tolerated them in the past. Return to clinic in 4 months with labs a few days before.   Orders Placed This Encounter   Procedures   CBC with Differential/Platelet    Standing Status:   Future    Expected Date:   11/10/2024    Expiration Date:   02/08/2025   C-reactive protein    Standing Status:   Future    Expected Date:   11/10/2024    Expiration Date:   02/08/2025   Vitamin B12    Standing Status:   Future    Expected Date:   11/10/2024    Expiration Date:   02/08/2025   Ferritin    Standing Status:   Future    Expected Date:   11/10/2024    Expiration Date:   02/08/2025   Iron  and TIBC    Standing Status:   Future    Expected Date:   11/10/2024    Expiration Date:   02/08/2025    INTERVAL HISTORY: Patient returns for follow-up for iron  deficiency anemia.  Reports she tolerated dose of INFeD  well on 04/20/2024.  Denies any bright red blood per rectum, melena or hematochezia.  She is currently not taking iron  supplements although she has taken them in the past especially when she has been pregnant and tolerated well.  Reports feeling much better following iron  infusion.  Energy levels are slowly improving.  We reviewed CBC, CMP, ferritin, iron  panel and CRP.  SUMMARY OF HEMATOLOGIC HISTORY: Oncology History   No history exists.     CBC    Component Value Date/Time   WBC 7.4 07/02/2024 0923   RBC 5.19 (H) 07/02/2024 0923   HGB 14.8 07/02/2024  0923   HGB 12.3 08/25/2018 0847   HCT 45.3 07/02/2024 0923   HCT 38.0 08/25/2018 0847   PLT 404 (H) 07/02/2024 0923   PLT 346 08/25/2018 0847   MCV 87.3 07/02/2024 0923   MCV 89 08/25/2018 0847   MCH 28.5 07/02/2024 0923   MCHC 32.7 07/02/2024 0923   RDW 17.1 (H) 07/02/2024 0923   RDW 12.0 (L) 08/25/2018 0847   LYMPHSABS 3.0 07/02/2024 0923   MONOABS 0.6 07/02/2024 0923   EOSABS 0.2 07/02/2024 0923   BASOSABS 0.1 07/02/2024 0923       Latest Ref Rng & Units 04/06/2024    8:30 AM 12/29/2023    8:10 AM 09/29/2023    8:15 AM  CMP  Glucose 65 - 99 mg/dL 852  850  806   BUN 7 - 25 mg/dL 13  14  9    Creatinine 0.50 - 0.99 mg/dL 9.45  9.42  9.43    Sodium 135 - 146 mmol/L 137  138  137   Potassium 3.5 - 5.3 mmol/L 4.3  4.1  4.2   Chloride 98 - 110 mmol/L 100  101  99   CO2 20 - 32 mmol/L 28  30  28    Calcium 8.6 - 10.2 mg/dL 9.6  9.6  9.4   Total Protein 6.1 - 8.1 g/dL 7.1  7.1  7.0   Total Bilirubin 0.2 - 1.2 mg/dL 0.4  0.4  0.3   AST 10 - 30 U/L 17  13  16    ALT 6 - 29 U/L 23  14  23       Lab Results  Component Value Date   FERRITIN 49 07/02/2024   VITAMINB12 674 07/02/2024    There were no vitals filed for this visit.  Review of System:  Review of Systems  Constitutional:  Positive for malaise/fatigue.  Cardiovascular:  Positive for palpitations.    Physical Exam: Physical Exam Neurological:     Mental Status: She is alert and oriented to person, place, and time.     I provided 20 minutes of non face-to-face telephone visit time during this encounter, and > 50% was spent counseling as documented under my assessment & plan.  [name], NP 07/10/2024 11:31 AM

## 2024-07-10 NOTE — Assessment & Plan Note (Addendum)
 Referred by PCP for an elevated platelet count ranging from 403-458. Etiology of thrombocytosis likely secondary to iron  deficiency. Workup from 04/10/2024 showed persistent thrombocytosis with an elevated platelet count of 458, normal CBC with differential.  Iron  panel showed an iron  saturation of 11% with an elevated TIBC.  Ferritin was 5.  B12 and MMA were WNL.  Vitamin D  level and copper  level are also normal. CRP, LDH and sed rate were WNL.  TSH normal.  JAK2 testing with reflex were all normal.  ANA and rheumatoid factor are WNL. She received 1 g INFeD  on 04/20/2024 due to extremely low ferritin levels. Tolerated iron  well.   She is currently not on any type of over-the-counter supplement except for vitamin D . Repeat labs from 07/02/2024 showed platelets of 404, hemoglobin 14.8, iron  saturation 32% with normal TIBC.  Ferritin 49.  B12 674. She does not need any additional IV iron  at this time.  Recommend starting ferrous sulfate or ferrous gluconate 1 tablet every other day with vitamin C.  Reports she has tolerated them in the past. Return to clinic in 4 months with labs a few days before.

## 2024-07-11 ENCOUNTER — Ambulatory Visit: Admitting: Family Medicine

## 2024-07-12 ENCOUNTER — Other Ambulatory Visit: Payer: Self-pay | Admitting: Family Medicine

## 2024-07-12 ENCOUNTER — Encounter (INDEPENDENT_AMBULATORY_CARE_PROVIDER_SITE_OTHER): Payer: Self-pay | Admitting: Gastroenterology

## 2024-07-13 ENCOUNTER — Other Ambulatory Visit: Payer: Self-pay | Admitting: Family Medicine

## 2024-07-14 LAB — COMPREHENSIVE METABOLIC PANEL WITH GFR
AG Ratio: 1.9 (calc) (ref 1.0–2.5)
ALT: 14 U/L (ref 6–29)
AST: 10 U/L (ref 10–30)
Albumin: 4.5 g/dL (ref 3.6–5.1)
Alkaline phosphatase (APISO): 57 U/L (ref 31–125)
BUN: 11 mg/dL (ref 7–25)
CO2: 29 mmol/L (ref 20–32)
Calcium: 9.7 mg/dL (ref 8.6–10.2)
Chloride: 101 mmol/L (ref 98–110)
Creat: 0.51 mg/dL (ref 0.50–0.99)
Globulin: 2.4 g/dL (ref 1.9–3.7)
Glucose, Bld: 128 mg/dL — ABNORMAL HIGH (ref 65–99)
Potassium: 4.3 mmol/L (ref 3.5–5.3)
Sodium: 138 mmol/L (ref 135–146)
Total Bilirubin: 0.5 mg/dL (ref 0.2–1.2)
Total Protein: 6.9 g/dL (ref 6.1–8.1)
eGFR: 118 mL/min/1.73m2 (ref >=60)

## 2024-07-14 LAB — CBC WITH DIFFERENTIAL/PLATELET
Absolute Lymphocytes: 2800 {cells}/uL (ref 850–3900)
Absolute Monocytes: 632 {cells}/uL (ref 200–950)
Basophils Absolute: 40 {cells}/uL (ref 0–200)
Basophils Relative: 0.5 %
Eosinophils Absolute: 168 {cells}/uL (ref 15–500)
Eosinophils Relative: 2.1 %
HCT: 43.3 % (ref 35.0–45.0)
Hemoglobin: 14.2 g/dL (ref 11.7–15.5)
MCH: 28.7 pg (ref 27.0–33.0)
MCHC: 32.8 g/dL (ref 32.0–36.0)
MCV: 87.5 fL (ref 80.0–100.0)
MPV: 10.2 fL (ref 7.5–12.5)
Monocytes Relative: 7.9 %
Neutro Abs: 4360 {cells}/uL (ref 1500–7800)
Neutrophils Relative %: 54.5 %
Platelets: 384 Thousand/uL (ref 140–400)
RBC: 4.95 Million/uL (ref 3.80–5.10)
RDW: 15.2 % — ABNORMAL HIGH (ref 11.0–15.0)
Total Lymphocyte: 35 %
WBC: 8 Thousand/uL (ref 3.8–10.8)

## 2024-07-14 LAB — HEMOGLOBIN A1C
Hgb A1c MFr Bld: 6.4 % — ABNORMAL HIGH (ref <5.7)
Mean Plasma Glucose: 137 mg/dL
eAG (mmol/L): 7.6 mmol/L

## 2024-07-14 LAB — LIPID PANEL
Cholesterol: 134 mg/dL (ref <200)
HDL: 48 mg/dL — ABNORMAL LOW (ref >=50)
LDL Cholesterol (Calc): 66 mg/dL
Non-HDL Cholesterol (Calc): 86 mg/dL (ref <130)
Total CHOL/HDL Ratio: 2.8 (calc) (ref <5.0)
Triglycerides: 122 mg/dL (ref <150)

## 2024-07-17 ENCOUNTER — Ambulatory Visit: Admitting: Family Medicine

## 2024-07-17 ENCOUNTER — Encounter: Payer: Self-pay | Admitting: Family Medicine

## 2024-07-17 VITALS — BP 122/85 | HR 107 | Temp 98.3°F | Ht 65.0 in | Wt 197.1 lb

## 2024-07-17 DIAGNOSIS — D225 Melanocytic nevi of trunk: Secondary | ICD-10-CM | POA: Insufficient documentation

## 2024-07-17 DIAGNOSIS — E785 Hyperlipidemia, unspecified: Secondary | ICD-10-CM

## 2024-07-17 DIAGNOSIS — E1169 Type 2 diabetes mellitus with other specified complication: Secondary | ICD-10-CM

## 2024-07-17 DIAGNOSIS — B9689 Other specified bacterial agents as the cause of diseases classified elsewhere: Secondary | ICD-10-CM

## 2024-07-17 DIAGNOSIS — F902 Attention-deficit hyperactivity disorder, combined type: Secondary | ICD-10-CM | POA: Diagnosis not present

## 2024-07-17 DIAGNOSIS — N898 Other specified noninflammatory disorders of vagina: Secondary | ICD-10-CM | POA: Insufficient documentation

## 2024-07-17 DIAGNOSIS — Z7984 Long term (current) use of oral hypoglycemic drugs: Secondary | ICD-10-CM

## 2024-07-17 DIAGNOSIS — E1165 Type 2 diabetes mellitus with hyperglycemia: Secondary | ICD-10-CM | POA: Diagnosis not present

## 2024-07-17 DIAGNOSIS — D75839 Thrombocytosis, unspecified: Secondary | ICD-10-CM

## 2024-07-17 DIAGNOSIS — N760 Acute vaginitis: Secondary | ICD-10-CM

## 2024-07-17 LAB — WET PREP FOR TRICH, YEAST, CLUE

## 2024-07-17 NOTE — Assessment & Plan Note (Signed)
 Well controlled on current regimen. She does not see psychiatry or a therapist any longer. Continue Adderall XR 15mg  daily, Adderall 10mg  PRN and follow up in 3 months via video visit. Encouraged adequate daily calorie intake, especially in early morning and in evening. Report changes in weight, appetite, or breakthrough symptoms.

## 2024-07-17 NOTE — Assessment & Plan Note (Signed)
 LDL at goal <70. Continue Rosuvastatin 5mg  daily. Your labs showed elevated cholesterol. I recommend consuming a heart healthy diet such as Mediterranean diet or DASH diet with whole grains, fruits, vegetable, fish, lean meats, nuts, and olive oil. Limit sweets and processed foods. I also encourage moderate intensity exercise 150 minutes weekly. This is 3-5 times weekly for 30-50 minutes each session. Goal should be pace of 3 miles/hours, or walking 1.5 miles in 30 minutes. Follow up in 6 months or sooner if needed

## 2024-07-17 NOTE — Progress Notes (Signed)
 Acute Office Visit  Patient ID: Nicole [name], female    DOB: 11/13/1980, 43 y.o.   MRN: [id number]  PCP: [name], FNP  Chief Complaint  Patient presents with   Medical Management of Chronic Issues    3 month f/u      Subjective:     HPI  Discussed the use of AI scribe software for clinical note transcription with the patient, who gave verbal consent to proceed.  History of Present Illness Nicole [name] is a 43 year old female with ADHD and type 2 diabetes who presents for medication management and follow-up.  She manages her ADHD with Adderall, taking 15 mg of extended-release daily and 10 mg of immediate-release as needed. She has discontinued anaphronil and guanfacine , and since stopping clonidine, she feels more emotional but in a positive way. She works as a lawyer, primarily in middle school. She experiences some difficulty relaxing.  She takes metformin  once daily for type 2 diabetes, and her endocrinologist recently increased her dose of Jardiance. Her recent A1c is 6.4. She also takes rosuvastatin for cholesterol, with a recent level of 66.  She recently had an iron  infusion due to a ferritin level of 5.  She experiences recurrent yeast infections, which she attributes to Hagan use. She describes a 'bakery' smell but no itching, and previous episodes required multiple doses of fluconazole  for resolution.  She takes omeprazole  as needed and Synthroid  regularly. Hydroxyzine  is taken rarely.   Review of Systems  All other systems reviewed and are negative.   Past Medical History:  Diagnosis Date   Anxiety    Back pain    Depression    Diabetes mellitus without complication (HCC)    GERD (gastroesophageal reflux disease)    Hypothyroidism    Palpitations    Pre-diabetes    Vitamin D  deficiency     Past Surgical History:  Procedure Laterality Date   BIOPSY  03/11/2023   Procedure: BIOPSY;  Surgeon: Eartha Angelia Sieving, MD;   Location: [facility];  Service: Gastroenterology;;   ESOPHAGOGASTRODUODENOSCOPY (EGD) WITH PROPOFOL  N/A 03/11/2023   Procedure: ESOPHAGOGASTRODUODENOSCOPY (EGD) WITH PROPOFOL ;  Surgeon: Eartha Angelia Sieving, MD;  Location: [facility];  Service: Gastroenterology;  Laterality: N/A;  7:30AM;ASA 1-2   MOUTH SURGERY      Current Outpatient Medications on File Prior to Visit  Medication Sig Dispense Refill   ACCU-CHEK GUIDE TEST test strip as directed.     amphetamine -dextroamphetamine  (ADDERALL XR) 15 MG 24 hr capsule Take 1 capsule by mouth every morning. 30 capsule 0   amphetamine -dextroamphetamine  (ADDERALL) 10 MG tablet Take 1 tablet (10 mg total) by mouth daily as needed (uncontrolled ADHD). 90 tablet 0   Barberry-Oreg Grape-Goldenseal (BERBERINE COMPLEX PO) Take 1 capsule by mouth 2 (two) times daily.     cetirizine  (ZYRTEC ) 10 MG tablet Take 10 mg by mouth at bedtime.     Cholecalciferol (VITAMIN D ) 125 MCG (5000 UT) CAPS Take 1 capsule by mouth daily. 30 capsule 0   CHOLINE BITARTRATE PO Take 250 mg by mouth daily.     hydrOXYzine  (ATARAX ) 10 MG tablet Take 1 tablet (10 mg total) by mouth 3 (three) times daily as needed for anxiety. 30 tablet 1   Inositol Niacinate 250 MG TABS Take 250 mg by mouth daily.     JARDIANCE 25 MG TABS tablet Take 25 mg by mouth daily.     levothyroxine  (SYNTHROID ) 50 MCG tablet Take 1-2 tablets (50-100 mcg total) by  mouth daily before breakfast. 100 mcg on Sun and Wed morning, 50 mcg all other days 60 tablet 0   magnesium  gluconate (MAGONATE) 500 MG tablet Take 500 mg by mouth daily.     metFORMIN  (GLUCOPHAGE -XR) 500 MG 24 hr tablet Take 1 tablet (500 mg total) by mouth 2 (two) times daily with a meal. (Patient taking differently: Take 500 mg by mouth daily.) 90 tablet 0   Misc Natural Products (FOCUSED MIND PO) Take 2 capsules by mouth daily. True Focus     Multiple Vitamin (MULTIVITAMIN) tablet Take 1 tablet by mouth daily.     Omega-3 Fatty Acids  (FISH OIL) 1000 MG CAPS Take 200 mg by mouth daily.     omeprazole  (PRILOSEC) 40 MG capsule Take 1 capsule (40 mg total) by mouth daily. 60 capsule 1   Probiotic Product (PROBIOTIC DAILY PO) Take 1 capsule by mouth daily. 25 Billion     rosuvastatin (CRESTOR) 5 MG tablet Take 5 mg by mouth daily.     No current facility-administered medications on file prior to visit.    Allergies  Allergen Reactions   Wellbutrin  [Bupropion ] Tinitus       Objective:    BP 122/85   Pulse (!) 107   Temp 98.3 F (36.8 C)   Ht 5' 5 (1.651 m)   Wt 197 lb 1 oz (89.4 kg)   LMP 06/25/2024 (Approximate)   SpO2 98%   BMI 32.79 kg/m    Physical Exam Vitals and nursing note reviewed.  Constitutional:      Appearance: Normal appearance. She is normal weight.  HENT:     Head: Normocephalic and atraumatic.  Skin:    General: Skin is warm and dry.  Neurological:     General: No focal deficit present.     Mental Status: She is alert and oriented to person, place, and time. Mental status is at baseline.  Psychiatric:        Mood and Affect: Mood normal.        Behavior: Behavior normal.        Thought Content: Thought content normal.        Judgment: Judgment normal.       Results for orders placed or performed in visit on 07/17/24  WET PREP FOR TRICH, YEAST, CLUE   Specimen: Genital  Result Value Ref Range   Source: GENITAL    RESULT         Assessment & Plan:   Problem List Items Addressed This Visit     Diabetes mellitus (HCC)   Followed by Endo, continue Jardiance, Metformin . A1c 6.4% and uACR UTD. Foot exam UTD. Vaccines UTD. Retinal eye exam UTD. Recommend heart healthy diet such as Mediterranean diet with whole grains, fruits, vegetable, fish, lean meats, nuts, and olive oil. Limit salt. Encouraged moderate walking, 3-5 times/week for 30-50 minutes each session. Aim for at least 150 minutes.week. Goal should be pace of 3 miles/hours, or walking 1.5 miles in 30 minutes. Seek  medical care for urinary frequency, extreme thirst, vision changes, lightheadedness, dizziness.  Follow up in 1 year or sooner if needed      Attention deficit hyperactivity disorder (ADHD), combined type - Primary   Well controlled on current regimen. She does not see psychiatry or a therapist any longer. Continue Adderall XR 15mg  daily, Adderall 10mg  PRN and follow up in 3 months via video visit. Encouraged adequate daily calorie intake, especially in early morning and in evening. Report changes in weight,  appetite, or breakthrough symptoms.       Hyperlipidemia associated with type 2 diabetes mellitus (HCC)   LDL at goal <70. Continue Rosuvastatin 5mg  daily. Your labs showed elevated cholesterol. I recommend consuming a heart healthy diet such as Mediterranean diet or DASH diet with whole grains, fruits, vegetable, fish, lean meats, nuts, and olive oil. Limit sweets and processed foods. I also encourage moderate intensity exercise 150 minutes weekly. This is 3-5 times weekly for 30-50 minutes each session. Goal should be pace of 3 miles/hours, or walking 1.5 miles in 30 minutes. Follow up in 6 months or sooner if needed      Bacterial vaginosis   Thrombocytosis   Followed by Hematology, etiology IDA. Received iron  transfusion for low ferritin. Follow up labs normal. Continue ferous sulfate daily or every other day with Vitamin C      Vaginal discharge   Relevant Orders   WET PREP FOR TRICH, YEAST, CLUE (Completed)    Assessment and Plan Assessment & Plan Type 2 diabetes mellitus Well-controlled with A1c of 6.4%. On metformin  and increased Jardiance. - Continue metformin  500 mg oral bid with meal. - Continue Jardiance 10 mg oral daily.  Iron  deficiency anemia Post-infusion labs show normalized red blood cell and platelet counts.  Attention-deficit hyperactivity disorder, combined type ADHD managed with Adderall XR and Adderall. Discontinued guanfacine  with positive emotional  adjustment. - Continue Adderall XR 15 mg oral every morning. - Continue Adderall 10 mg oral daily as needed.    No orders of the defined types were placed in this encounter.   Return in about 3 months (around 10/17/2024) for follow-up ADHD.  Jeoffrey GORMAN Barrio, FNP [facility]

## 2024-07-17 NOTE — Assessment & Plan Note (Signed)
 Followed by Hematology, etiology IDA. Received iron  transfusion for low ferritin. Follow up labs normal. Continue ferous sulfate daily or every other day with Vitamin C

## 2024-07-17 NOTE — Assessment & Plan Note (Signed)
 Followed by Endo, continue Jardiance, Metformin . A1c 6.4% and uACR UTD. Foot exam UTD. Vaccines UTD. Retinal eye exam UTD. Recommend heart healthy diet such as Mediterranean diet with whole grains, fruits, vegetable, fish, lean meats, nuts, and olive oil. Limit salt. Encouraged moderate walking, 3-5 times/week for 30-50 minutes each session. Aim for at least 150 minutes.week. Goal should be pace of 3 miles/hours, or walking 1.5 miles in 30 minutes. Seek medical care for urinary frequency, extreme thirst, vision changes, lightheadedness, dizziness.  Follow up in 1 year or sooner if needed

## 2024-07-18 ENCOUNTER — Ambulatory Visit: Admitting: Family Medicine

## 2024-07-19 ENCOUNTER — Other Ambulatory Visit: Payer: Self-pay | Admitting: Family Medicine

## 2024-07-19 MED ORDER — METRONIDAZOLE 500 MG PO TABS: 500.0000 mg | ORAL_TABLET | Freq: Two times a day (BID) | ORAL | 0 refills | Status: AC

## 2024-07-27 ENCOUNTER — Encounter: Payer: Self-pay | Admitting: Family Medicine

## 2024-07-27 ENCOUNTER — Other Ambulatory Visit: Payer: Self-pay | Admitting: Family Medicine

## 2024-07-27 MED ORDER — AMPHETAMINE-DEXTROAMPHETAMINE 10 MG PO TABS: 10.0000 mg | ORAL_TABLET | Freq: Every day | ORAL | 0 refills | Status: DC | PRN

## 2024-08-02 ENCOUNTER — Other Ambulatory Visit: Payer: Self-pay | Admitting: Family Medicine

## 2024-08-02 MED ORDER — AMPHETAMINE-DEXTROAMPHET ER 15 MG PO CP24: 15.0000 mg | ORAL_CAPSULE | ORAL | 0 refills | Status: DC

## 2024-09-03 ENCOUNTER — Telehealth: Payer: Self-pay | Admitting: Family Medicine

## 2024-09-03 ENCOUNTER — Other Ambulatory Visit: Payer: Self-pay

## 2024-09-03 DIAGNOSIS — J111 Influenza due to unidentified influenza virus with other respiratory manifestations: Secondary | ICD-10-CM

## 2024-09-03 MED ORDER — OSELTAMIVIR PHOSPHATE 75 MG PO CAPS: 75.0000 mg | ORAL_CAPSULE | Freq: Two times a day (BID) | ORAL | 0 refills | Status: AC

## 2024-09-03 NOTE — Telephone Encounter (Signed)
 Copied from CRM #[id number]. Topic: Clinical - Medication Question >> Sep 03, 2024 10:47 AM Berneda FALCON wrote: Reason for CRM: Patient states that her and husband Glenetta) are both having flu like symptoms and would like to know if PCP could call in Tamiflu  for them both, please. Both children were dx with the flu yesterday (12/21). They wanted to know if they would be able to get something called in without being seen because they are pretty sure they have the same thing as their children and feel they do not need an appt to confirm, if at all possible.  If so, could we please call it into the following pharmacy: [id number] - [place], [place] - [address] AT [facility] [address] [place] [address] Phone: [phone] Fax: [phone] Hours: Not open 24 hours  Patient callback is [phone] (home)

## 2024-09-11 ENCOUNTER — Other Ambulatory Visit: Payer: Self-pay

## 2024-09-11 ENCOUNTER — Encounter: Payer: Self-pay | Admitting: Family Medicine

## 2024-09-11 MED ORDER — AMPHETAMINE-DEXTROAMPHET ER 15 MG PO CP24: 15.0000 mg | ORAL_CAPSULE | ORAL | 0 refills | Status: AC

## 2024-09-11 MED ORDER — AMPHETAMINE-DEXTROAMPHETAMINE 10 MG PO TABS: 10.0000 mg | ORAL_TABLET | Freq: Every day | ORAL | 0 refills | Status: AC | PRN

## 2024-09-14 ENCOUNTER — Other Ambulatory Visit: Payer: Self-pay | Admitting: Family Medicine

## 2024-10-17 ENCOUNTER — Ambulatory Visit: Admitting: Family Medicine

## 2024-11-05 ENCOUNTER — Ambulatory Visit: Admitting: Family Medicine

## 2024-11-09 ENCOUNTER — Inpatient Hospital Stay

## 2024-11-14 ENCOUNTER — Ambulatory Visit: Admitting: Family Medicine

## 2024-11-16 ENCOUNTER — Inpatient Hospital Stay: Admitting: Oncology

## 2025-01-04 ENCOUNTER — Other Ambulatory Visit

## 2025-01-07 ENCOUNTER — Encounter: Admitting: Family Medicine
